# Patient Record
Sex: Male | Born: 1980 | Race: Black or African American | Hispanic: No | Smoking: Current every day smoker
Health system: Southern US, Community
[De-identification: ages and names within clinical notes are randomized; demographics above are authoritative.]

## PROBLEM LIST (undated history)

## (undated) DIAGNOSIS — K5792 Diverticulitis of intestine, part unspecified, without perforation or abscess without bleeding: Secondary | ICD-10-CM

## (undated) DIAGNOSIS — K219 Gastro-esophageal reflux disease without esophagitis: Secondary | ICD-10-CM

## (undated) DIAGNOSIS — I340 Nonrheumatic mitral (valve) insufficiency: Secondary | ICD-10-CM

## (undated) DIAGNOSIS — K3580 Unspecified acute appendicitis: Secondary | ICD-10-CM

## (undated) DIAGNOSIS — I209 Angina pectoris, unspecified: Secondary | ICD-10-CM

## (undated) DIAGNOSIS — F17200 Nicotine dependence, unspecified, uncomplicated: Secondary | ICD-10-CM

## (undated) DIAGNOSIS — Z8489 Family history of other specified conditions: Secondary | ICD-10-CM

## (undated) DIAGNOSIS — N451 Epididymitis: Secondary | ICD-10-CM

## (undated) DIAGNOSIS — K3532 Acute appendicitis with perforation and localized peritonitis, without abscess: Secondary | ICD-10-CM

## (undated) DIAGNOSIS — I2510 Atherosclerotic heart disease of native coronary artery without angina pectoris: Secondary | ICD-10-CM

## (undated) DIAGNOSIS — R079 Chest pain, unspecified: Secondary | ICD-10-CM

## (undated) DIAGNOSIS — E669 Obesity, unspecified: Secondary | ICD-10-CM

## (undated) HISTORY — PX: CYSTECTOMY: SUR359

## (undated) HISTORY — DX: Atherosclerotic heart disease of native coronary artery without angina pectoris: I25.10

## (undated) HISTORY — DX: Chest pain, unspecified: R07.9

---
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
## 2014-03-16 ENCOUNTER — Encounter (HOSPITAL_COMMUNITY): Payer: Self-pay | Admitting: *Deleted

## 2014-03-16 ENCOUNTER — Emergency Department (HOSPITAL_COMMUNITY)
Admission: EM | Admit: 2014-03-16 | Discharge: 2014-03-16 | Disposition: A | Discharge status: Home / Self Care | Payer: Self-pay | Attending: Emergency Medicine | Admitting: Emergency Medicine

## 2014-03-16 DIAGNOSIS — H109 Unspecified conjunctivitis: Secondary | ICD-10-CM | POA: Insufficient documentation

## 2014-03-16 MED ORDER — SULFACETAMIDE SODIUM 10 % OP SOLN: 1.0000 [drp] | OPHTHALMIC | Status: AC

## 2014-03-16 NOTE — ED Provider Notes (Signed)
CSN: [id number]     Arrival date & time 03/16/14  0901 History  This chart was scribed for non-physician practitioner, Ebbie Ridgehris Shashwat Cleary, PA-C, working with Doug SouSam Jacubowitz, MD, by Lionel DecemberHatice Demirci, ED Scribe. This patient was seen in room TR04C/TR04C and the patient's care was started at 9:11 AM.   First MD Initiated Contact with Patient 03/16/14 [phone]     No chief complaint on file.    (Consider location/radiation/quality/duration/timing/severity/associated sxs/prior Treatment) The history is provided by the patient. No language interpreter was used.    HPI Comments: Kyle Silva is a 34 y.o. male who presents to the Emergency Department complaining of constant right eye pain/irritation and itching onset 1 day ago.  Patient does not wear any contacts but states that he has bad vision.  He has not taken any medication to alleviate symptoms.  He does not have any other complaints today.   No past medical history on file. No past surgical history on file. No family history on file. History  Substance Use Topics  . Smoking status: Not on file  . Smokeless tobacco: Not on file  . Alcohol Use: Not on file    Review of Systems  Constitutional: Negative for fever and chills.  Eyes: Positive for pain, redness and itching.  Respiratory: Negative for cough and shortness of breath.   Cardiovascular: Negative for chest pain.  Gastrointestinal: Negative for nausea and vomiting.      Allergies  Review of patient's allergies indicates not on file.  Home Medications   Prior to Admission medications   Not on File   BP 130/76 mmHg  Pulse 61  Temp(Src) 98.5 F (36.9 C) (Oral)  Resp 18  SpO2 97% Physical Exam  Constitutional: He is oriented to person, place, and time. He appears well-developed and well-nourished. No distress.  HENT:  Head: Normocephalic and atraumatic.  Eyes: EOM are normal. Pupils are equal, round, and reactive to light. Right conjunctiva is injected.  Pulmonary/Chest:  Effort normal. No respiratory distress.  Musculoskeletal: Normal range of motion.  Neurological: He is alert and oriented to person, place, and time.  Skin: Skin is warm and dry.  Psychiatric: He has a normal mood and affect. His behavior is normal.  Nursing note and vitals reviewed.   ED Course  Procedures (including critical care time) DIAGNOSTIC STUDIES: Oxygen Saturation is 97% on RA, normal by my interpretation.    COORDINATION OF CARE: 9:16 AM Discussed treatment plan with patient at beside, the patient agrees with the plan and has no further questions at this time.  Referred to opthalmology told to return here as needed.      Charlestine NightChristopher Kadence Mikkelson, PA-C 03/16/14 [phone]  Doug SouSam Jacubowitz, MD 03/16/14 [phone]

## 2014-03-16 NOTE — ED Notes (Signed)
Pt reports waking up this AM with eye irritation to RT eye . Pain 3/10 to RT eye.

## 2014-03-16 NOTE — Discharge Instructions (Signed)
Return here as needed. Follow up with the eye doctor provided.  °

## 2014-03-16 NOTE — ED Notes (Signed)
Declined W/C at D/C and was escorted to lobby by RN. 

## 2014-07-13 ENCOUNTER — Inpatient Hospital Stay (HOSPITAL_COMMUNITY)
Admission: EM | Admit: 2014-07-13 | Discharge: 2014-07-14 | DRG: 287 | Disposition: A | Discharge status: Home / Self Care | Payer: Self-pay | Attending: Cardiovascular Disease | Admitting: Cardiovascular Disease

## 2014-07-13 ENCOUNTER — Encounter (HOSPITAL_COMMUNITY)
Admission: EM | Disposition: A | Discharge status: Home / Self Care | Payer: Self-pay | Source: Home / Self Care | Attending: Cardiovascular Disease

## 2014-07-13 ENCOUNTER — Encounter (HOSPITAL_COMMUNITY): Payer: Self-pay | Admitting: Emergency Medicine

## 2014-07-13 ENCOUNTER — Emergency Department (HOSPITAL_COMMUNITY): Payer: Self-pay

## 2014-07-13 ENCOUNTER — Ambulatory Visit (HOSPITAL_COMMUNITY): Payer: Self-pay

## 2014-07-13 DIAGNOSIS — Z8249 Family history of ischemic heart disease and other diseases of the circulatory system: Secondary | ICD-10-CM

## 2014-07-13 DIAGNOSIS — F17200 Nicotine dependence, unspecified, uncomplicated: Secondary | ICD-10-CM | POA: Diagnosis present

## 2014-07-13 DIAGNOSIS — R079 Chest pain, unspecified: Secondary | ICD-10-CM

## 2014-07-13 DIAGNOSIS — E669 Obesity, unspecified: Secondary | ICD-10-CM | POA: Diagnosis present

## 2014-07-13 DIAGNOSIS — K219 Gastro-esophageal reflux disease without esophagitis: Secondary | ICD-10-CM | POA: Diagnosis present

## 2014-07-13 DIAGNOSIS — E785 Hyperlipidemia, unspecified: Secondary | ICD-10-CM | POA: Diagnosis present

## 2014-07-13 DIAGNOSIS — R001 Bradycardia, unspecified: Secondary | ICD-10-CM | POA: Diagnosis present

## 2014-07-13 DIAGNOSIS — I25110 Atherosclerotic heart disease of native coronary artery with unstable angina pectoris: Principal | ICD-10-CM | POA: Diagnosis present

## 2014-07-13 DIAGNOSIS — I2510 Atherosclerotic heart disease of native coronary artery without angina pectoris: Secondary | ICD-10-CM

## 2014-07-13 DIAGNOSIS — F17210 Nicotine dependence, cigarettes, uncomplicated: Secondary | ICD-10-CM | POA: Diagnosis present

## 2014-07-13 DIAGNOSIS — R739 Hyperglycemia, unspecified: Secondary | ICD-10-CM | POA: Diagnosis present

## 2014-07-13 DIAGNOSIS — I200 Unstable angina: Secondary | ICD-10-CM | POA: Diagnosis present

## 2014-07-13 HISTORY — DX: Family history of other specified conditions: Z84.89

## 2014-07-13 HISTORY — DX: Gastro-esophageal reflux disease without esophagitis: K21.9

## 2014-07-13 HISTORY — DX: Angina pectoris, unspecified: I20.9

## 2014-07-13 HISTORY — PX: CARDIAC CATHETERIZATION: SHX172

## 2014-07-13 HISTORY — DX: Obesity, unspecified: E66.9

## 2014-07-13 HISTORY — DX: Nicotine dependence, unspecified, uncomplicated: F17.200

## 2014-07-13 LAB — BASIC METABOLIC PANEL
Anion gap: 8 (ref 5–15)
BUN: 11 mg/dL (ref 6–20)
CALCIUM: 9.4 mg/dL (ref 8.9–10.3)
CO2: 28 mmol/L (ref 22–32)
CREATININE: 1.03 mg/dL (ref 0.61–1.24)
Chloride: 102 mmol/L (ref 101–111)
GFR calc Af Amer: >60 mL/min (ref >60)
GFR calc non Af Amer: >60 mL/min (ref >60)
GLUCOSE: 157 mg/dL — AB (ref 65–99)
Potassium: 4.2 mmol/L (ref 3.5–5.1)
Sodium: 138 mmol/L (ref 135–145)

## 2014-07-13 LAB — LIPID PANEL
CHOL/HDL RATIO: 5.5 ratio
Cholesterol: 216 mg/dL — ABNORMAL HIGH (ref 0–200)
HDL: 39 mg/dL — AB (ref >40)
LDL CALC: 143 mg/dL — AB (ref 0–99)
Triglycerides: 168 mg/dL — ABNORMAL HIGH (ref <150)
VLDL: 34 mg/dL (ref 0–40)

## 2014-07-13 LAB — I-STAT TROPONIN, ED: Troponin i, poc: 0.3 ng/mL (ref 0.00–0.08)

## 2014-07-13 LAB — CBC
HEMATOCRIT: 44.3 % (ref 39.0–52.0)
Hemoglobin: 15.3 g/dL (ref 13.0–17.0)
MCH: 31 pg (ref 26.0–34.0)
MCHC: 34.5 g/dL (ref 30.0–36.0)
MCV: 89.7 fL (ref 78.0–100.0)
PLATELETS: 253 10*3/uL (ref 150–400)
RBC: 4.94 MIL/uL (ref 4.22–5.81)
RDW: 13.1 % (ref 11.5–15.5)
WBC: 14.1 10*3/uL — AB (ref 4.0–10.5)

## 2014-07-13 IMAGING — CR DG CHEST 1V PORT
1 series · 1 of 1 positions shown · non-contrast
Comparison: None.

CLINICAL DATA: Chest pain.

EXAM:
PORTABLE CHEST - 1 VIEW

[AP]
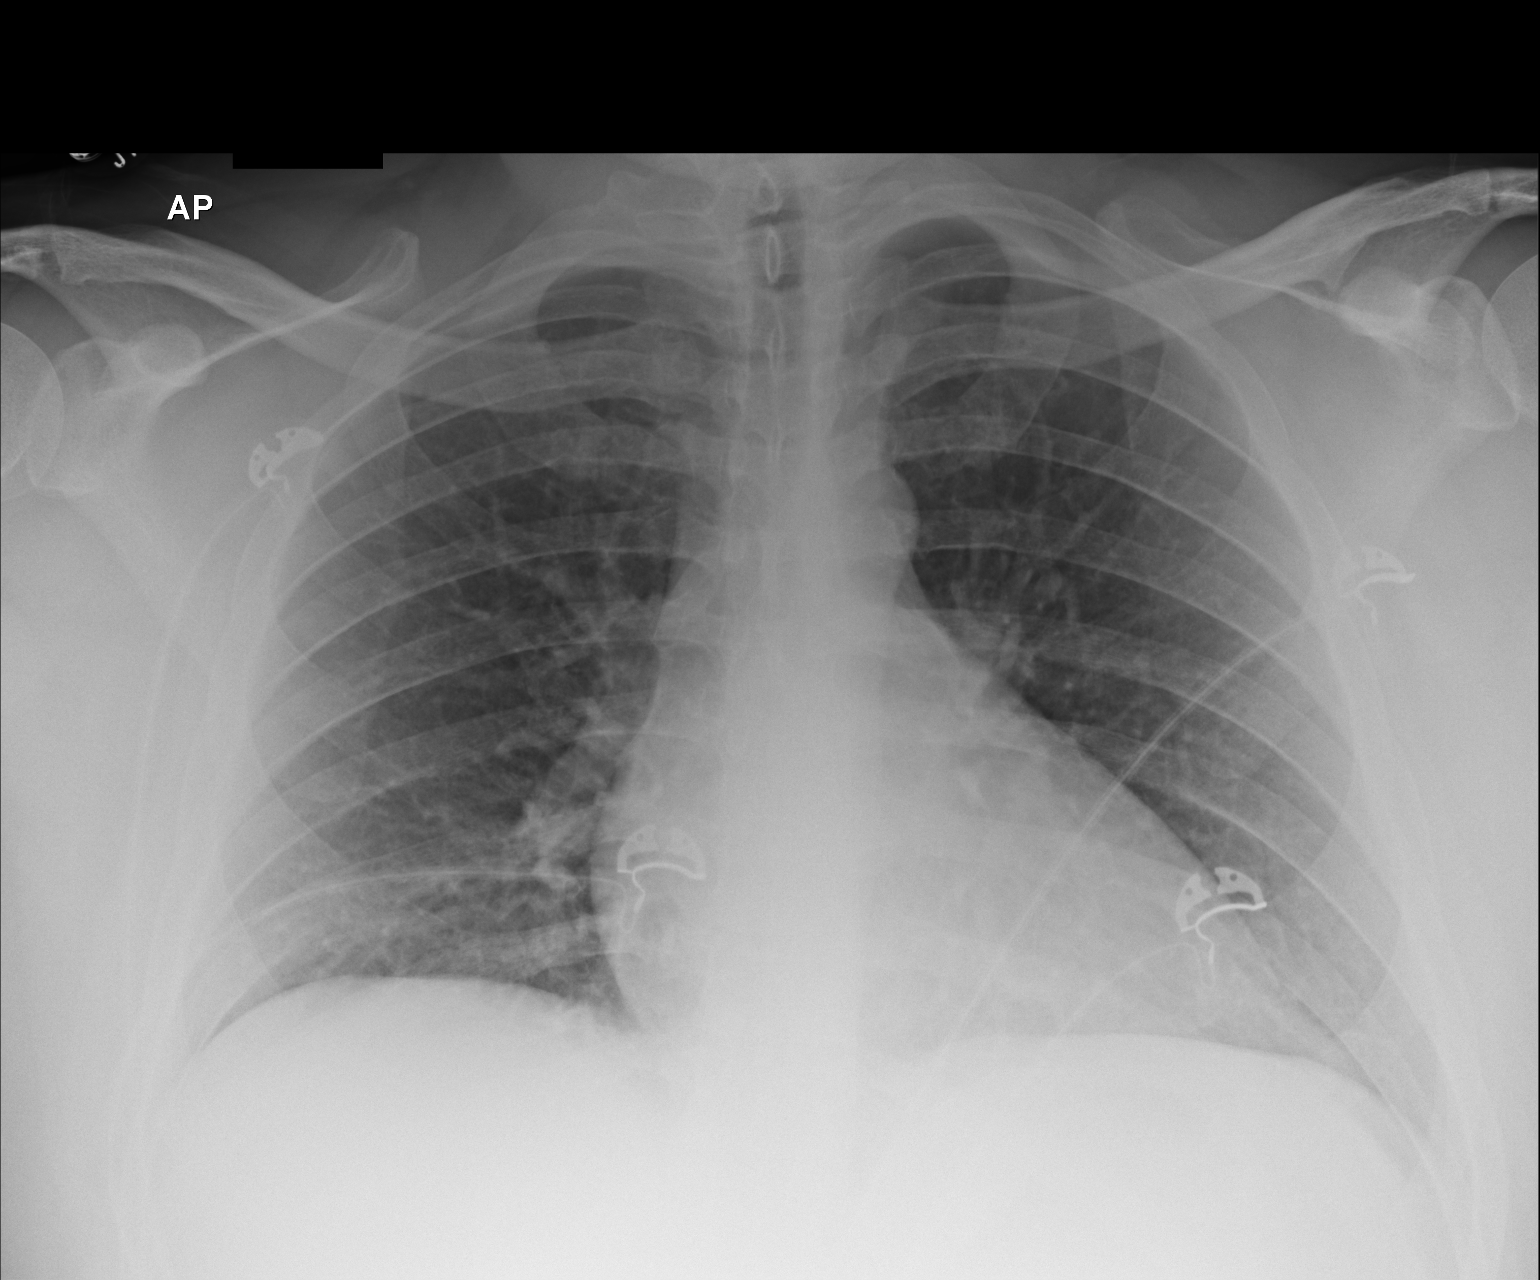

[1 of 1 positions shown; findings below may reference images not displayed]

FINDINGS: Normal heart size and mediastinal contours. Mild hypoventilation. No
acute infiltrate or edema. No effusion or pneumothorax. No acute
osseous findings.
IMPRESSION: Negative low volume chest.

## 2014-07-13 SURGERY — LEFT HEART CATH AND CORONARY ANGIOGRAPHY
Anesthesia: LOCAL

## 2014-07-13 MED ORDER — METOPROLOL TARTRATE 12.5 MG HALF TABLET
12.5000 mg | ORAL_TABLET | Freq: Two times a day (BID) | ORAL | Status: DC
  Administered 2014-07-13: 12.5 mg via ORAL
  Filled 2014-07-13 (×2): qty 1

## 2014-07-13 MED ORDER — MORPHINE SULFATE 4 MG/ML IJ SOLN
4.0000 mg | Freq: Once | INTRAMUSCULAR | Status: AC
  Administered 2014-07-13: 4 mg via INTRAVENOUS
  Filled 2014-07-13: qty 1

## 2014-07-13 MED ORDER — SODIUM CHLORIDE 0.9 % IJ SOLN
3.0000 mL | Freq: Two times a day (BID) | INTRAMUSCULAR | Status: DC
  Administered 2014-07-13: 3 mL via INTRAVENOUS

## 2014-07-13 MED ORDER — RADIAL COCKTAIL (HEPARIN/VERAPAMIL/LIDOCAINE/NITRO)
Status: DC | PRN
  Administered 2014-07-13: 1 via INTRA_ARTERIAL

## 2014-07-13 MED ORDER — SODIUM CHLORIDE 0.9 % WEIGHT BASED INFUSION
3.0000 mL/kg/h | INTRAVENOUS | Status: AC
Start: 2014-07-13 — End: 2014-07-13

## 2014-07-13 MED ORDER — ACETAMINOPHEN 325 MG PO TABS
650.0000 mg | ORAL_TABLET | ORAL | Status: DC | PRN
  Administered 2014-07-13: 650 mg via ORAL
  Filled 2014-07-13: qty 2

## 2014-07-13 MED ORDER — NITROGLYCERIN 0.4 MG SL SUBL: 0.4000 mg | SUBLINGUAL_TABLET | SUBLINGUAL | Status: DC | PRN

## 2014-07-13 MED ORDER — SODIUM CHLORIDE 0.9 % IV SOLN: 250.0000 mL | INTRAVENOUS | Status: DC | PRN

## 2014-07-13 MED ORDER — IOHEXOL 350 MG/ML SOLN
INTRAVENOUS | Status: DC | PRN
  Administered 2014-07-13: 130 mL via INTRA_ARTERIAL

## 2014-07-13 MED ORDER — LIDOCAINE HCL (PF) 1 % IJ SOLN
INTRAMUSCULAR | Status: AC
  Filled 2014-07-13: qty 30

## 2014-07-13 MED ORDER — NITROGLYCERIN 0.4 MG SL SUBL
0.4000 mg | SUBLINGUAL_TABLET | SUBLINGUAL | Status: DC | PRN
  Administered 2014-07-13: 0.4 mg via SUBLINGUAL

## 2014-07-13 MED ORDER — SODIUM CHLORIDE 0.9 % WEIGHT BASED INFUSION: 1.0000 mL/kg/h | INTRAVENOUS | Status: DC

## 2014-07-13 MED ORDER — ATORVASTATIN CALCIUM 40 MG PO TABS
40.0000 mg | ORAL_TABLET | Freq: Every day | ORAL | Status: DC
  Administered 2014-07-13: 40 mg via ORAL
  Filled 2014-07-13: qty 1

## 2014-07-13 MED ORDER — SODIUM CHLORIDE 0.9 % WEIGHT BASED INFUSION: 3.0000 mL/kg/h | INTRAVENOUS | Status: AC

## 2014-07-13 MED ORDER — HEPARIN SODIUM (PORCINE) 1000 UNIT/ML IJ SOLN
INTRAMUSCULAR | Status: AC
  Filled 2014-07-13: qty 1

## 2014-07-13 MED ORDER — NITROGLYCERIN 0.4 MG SL SUBL
SUBLINGUAL_TABLET | SUBLINGUAL | Status: AC
  Filled 2014-07-13: qty 1

## 2014-07-13 MED ORDER — ACETAMINOPHEN 325 MG PO TABS: 650.0000 mg | ORAL_TABLET | ORAL | Status: DC | PRN

## 2014-07-13 MED ORDER — MORPHINE SULFATE 2 MG/ML IJ SOLN: 2.0000 mg | INTRAMUSCULAR | Status: DC | PRN

## 2014-07-13 MED ORDER — FENTANYL CITRATE (PF) 100 MCG/2ML IJ SOLN
INTRAMUSCULAR | Status: DC | PRN
  Administered 2014-07-13 (×2): 25 ug via INTRAVENOUS

## 2014-07-13 MED ORDER — HEPARIN (PORCINE) IN NACL 2-0.9 UNIT/ML-% IJ SOLN
INTRAMUSCULAR | Status: AC
  Filled 2014-07-13: qty 500

## 2014-07-13 MED ORDER — VERAPAMIL HCL 2.5 MG/ML IV SOLN
INTRAVENOUS | Status: AC
  Filled 2014-07-13: qty 2

## 2014-07-13 MED ORDER — FENTANYL CITRATE (PF) 100 MCG/2ML IJ SOLN
INTRAMUSCULAR | Status: AC
  Filled 2014-07-13: qty 2

## 2014-07-13 MED ORDER — ASPIRIN EC 81 MG PO TBEC: 81.0000 mg | DELAYED_RELEASE_TABLET | Freq: Every day | ORAL | Status: DC

## 2014-07-13 MED ORDER — HEPARIN (PORCINE) IN NACL 2-0.9 UNIT/ML-% IJ SOLN
INTRAMUSCULAR | Status: AC
  Filled 2014-07-13: qty 1000

## 2014-07-13 MED ORDER — SODIUM CHLORIDE 0.9 % IJ SOLN: 3.0000 mL | INTRAMUSCULAR | Status: DC | PRN

## 2014-07-13 MED ORDER — MIDAZOLAM HCL 2 MG/2ML IJ SOLN
INTRAMUSCULAR | Status: AC
  Filled 2014-07-13: qty 2

## 2014-07-13 MED ORDER — SODIUM CHLORIDE 0.9 % IJ SOLN
3.0000 mL | Freq: Two times a day (BID) | INTRAMUSCULAR | Status: DC
  Administered 2014-07-13: 10 mL via INTRAVENOUS

## 2014-07-13 MED ORDER — NITROGLYCERIN 1 MG/10 ML FOR IR/CATH LAB
INTRA_ARTERIAL | Status: AC
  Filled 2014-07-13: qty 10

## 2014-07-13 MED ORDER — ONDANSETRON HCL 4 MG/2ML IJ SOLN: 4.0000 mg | Freq: Four times a day (QID) | INTRAMUSCULAR | Status: DC | PRN

## 2014-07-13 MED ORDER — HEPARIN SODIUM (PORCINE) 1000 UNIT/ML IJ SOLN
INTRAMUSCULAR | Status: DC | PRN
  Administered 2014-07-13: 6000 [IU] via INTRAVENOUS

## 2014-07-13 MED ORDER — LIDOCAINE HCL (PF) 1 % IJ SOLN
INTRAMUSCULAR | Status: DC | PRN
  Administered 2014-07-13: 5 mL via INTRADERMAL

## 2014-07-13 MED ORDER — ASPIRIN 325 MG PO TABS
325.0000 mg | ORAL_TABLET | ORAL | Status: AC
  Administered 2014-07-13: 325 mg via ORAL
  Filled 2014-07-13: qty 1

## 2014-07-13 MED ORDER — ASPIRIN 81 MG PO CHEW: 81.0000 mg | CHEWABLE_TABLET | ORAL | Status: DC

## 2014-07-13 MED ORDER — MIDAZOLAM HCL 2 MG/2ML IJ SOLN
INTRAMUSCULAR | Status: DC | PRN
  Administered 2014-07-13: 2 mg via INTRAVENOUS

## 2014-07-13 SURGICAL SUPPLY — 14 items
CATH INFINITI 5 FR AR1 MOD (CATHETERS) ×2 IMPLANT
CATH INFINITI 5 FR JL3.5 (CATHETERS) ×2 IMPLANT
CATH INFINITI 5FR ANG PIGTAIL (CATHETERS) ×2 IMPLANT
CATH INFINITI JR4 5F (CATHETERS) ×2 IMPLANT
CATH LAUNCHER 5F EBU3.5 (CATHETERS) ×2 IMPLANT
CATH OPTITORQUE TIG 4.0 5F (CATHETERS) ×2 IMPLANT
DEVICE RAD COMP TR BAND LRG (VASCULAR PRODUCTS) ×2 IMPLANT
GLIDESHEATH SLEND A-KIT 6F 22G (SHEATH) ×2 IMPLANT
KIT HEART LEFT (KITS) ×2 IMPLANT
PACK CARDIAC CATHETERIZATION (CUSTOM PROCEDURE TRAY) ×2 IMPLANT
SYR MEDRAD MARK V 150ML (SYRINGE) ×2 IMPLANT
TRANSDUCER W/STOPCOCK (MISCELLANEOUS) ×2 IMPLANT
TUBING CIL FLEX 10 FLL-RA (TUBING) ×2 IMPLANT
WIRE SAFE-T 1.5MM-J .035X260CM (WIRE) ×2 IMPLANT

## 2014-07-13 NOTE — ED Notes (Signed)
Pt arrives from home c/o CP new onset last night.  Pt states central chest,  Some N/V, denies lightheadedness, dizziness, SOB.  Pt reports more gas than usual, hx heart burn. Pt belching at this time. Resp e/u.

## 2014-07-13 NOTE — Interval H&P Note (Signed)
History and Physical Interval Note:  07/13/2014 8:59 AM  Bernestine AmassJames Roback  has presented today for surgery, with the diagnosis of urgent ACS/acute coronary syndrome.    The various methods of treatment have been discussed with the patient and family. After consideration of risks, benefits and other options for treatment, the patient has consented to  Procedure(s): Left Heart Cath and Coronary Angiography (N/A) as a surgical intervention .  The patient's history has been reviewed, patient examined, no change in status, stable for surgery.  I have reviewed the patient's chart and labs.  Questions were answered to the patient's satisfaction.     HARDING, DAVID W  Cath Lab Visit (complete for each Cath Lab visit)  Clinical Evaluation Leading to the Procedure:   ACS: Yes.    Non-ACS:    Anginal Classification: CCS IV  Anti-ischemic medical therapy: No Therapy  Non-Invasive Test Results: No non-invasive testing performed  Prior CABG: No previous CABG    TIMI Score  Patient Information:  TIMI Score is 2  UA/NSTEMI and low-risk features (e.g., TIMI score  Revascularization of the presumed culprit artery   U (6)  Indication: 9; Score: 6  HARDING, Piedad ClimesAVID W, M.D., M.S. Interventional Cardiologist   Pager # [phone]

## 2014-07-13 NOTE — ED Notes (Signed)
Pt tearful after provider left, his father passed away a year and a day ago from a massive heart attack.  RN comforted pt and visitor.

## 2014-07-13 NOTE — ED Provider Notes (Signed)
CSN: [id number]     Arrival date & time 07/13/14  0555 History   First MD Initiated Contact with Patient 07/13/14 [phone]     Chief Complaint  Patient presents with  . Chest Pain     (Consider location/radiation/quality/duration/timing/severity/associated sxs/prior Treatment) HPI Comments: Patient presents to the ED with a chief complaint of chest pain.  States that the pain started last night and is located in the center of his chest.  He reports associated nausea, vomiting, and diaphoresis.  Denies any radiating symptoms or SOB.  States that he has not taken anything for his symptoms.  States that he has a history of GERD, but that this pain is above where he normally experiences GERD.  The pain has been constant.  Cardiac risk factors are smoking and family history.  The history is provided by the patient. No language interpreter was used.    History reviewed. No pertinent past medical history. Past Surgical History  Procedure Laterality Date  . Cyst removal hand Left   . Cyst removal leg Left    No family history on file. History  Substance Use Topics  . Smoking status: Current Every Day Smoker -- 0.75 packs/day    Types: Cigarettes  . Smokeless tobacco: Never Used  . Alcohol Use: No    Review of Systems  Constitutional: Negative for fever and chills.  Respiratory: Negative for shortness of breath.   Cardiovascular: Positive for chest pain.  Gastrointestinal: Positive for nausea and vomiting. Negative for diarrhea and constipation.  Genitourinary: Negative for dysuria.  All other systems reviewed and are negative.     Allergies  Penicillins  Home Medications   Prior to Admission medications   Not on File   BP 149/86 mmHg  Pulse 8  Temp(Src) 98.2 F (36.8 C) (Oral)  Resp 20  Ht  (1.778 m)  Wt 250 lb (113.399 kg)  BMI 35.87 kg/m2  SpO2 100% Physical Exam  Constitutional: He is oriented to person, place, and time. He appears well-developed and  well-nourished.  HENT:  Head: Normocephalic and atraumatic.  Eyes: Conjunctivae and EOM are normal. Pupils are equal, round, and reactive to light. Right eye exhibits no discharge. Left eye exhibits no discharge. No scleral icterus.  Neck: Normal range of motion. Neck supple. No JVD present.  Cardiovascular: Normal rate, regular rhythm and normal heart sounds.  Exam reveals no gallop and no friction rub.   No murmur heard. Pulmonary/Chest: Effort normal and breath sounds normal. No respiratory distress. He has no wheezes. He has no rales. He exhibits no tenderness.  Abdominal: Soft. He exhibits no distension and no mass. There is no tenderness. There is no rebound and no guarding.  Musculoskeletal: Normal range of motion. He exhibits no edema or tenderness.  Neurological: He is alert and oriented to person, place, and time.  Skin: Skin is warm and dry.  Psychiatric: He has a normal mood and affect. His behavior is normal. Judgment and thought content normal.  Nursing note and vitals reviewed.   ED Course  Procedures (including critical care time) Results for orders placed or performed during the hospital encounter of 07/13/14  CBC  Result Value Ref Range   WBC 14.1 (H) 4.0 - 10.5 K/uL   RBC 4.94 4.22 - 5.81 MIL/uL   Hemoglobin 15.3 13.0 - 17.0 g/dL   HCT 11.9 14.7 - 82.9 %   MCV 89.7 78.0 - 100.0 fL   MCH 31.0 26.0 - 34.0 pg   MCHC 34.5 30.0 -  36.0 g/dL   RDW 16.113.1 09.611.5 - 04.515.5 %   Platelets 253 150 - 400 K/uL  Basic metabolic panel  Result Value Ref Range   Sodium 138 135 - 145 mmol/L   Potassium 4.2 3.5 - 5.1 mmol/L   Chloride 102 101 - 111 mmol/L   CO2 28 22 - 32 mmol/L   Glucose, Bld 157 (H) 65 - 99 mg/dL   BUN 11 6 - 20 mg/dL   Creatinine, Ser 4.091.03 0.61 - 1.24 mg/dL   Calcium 9.4 8.9 - 81.110.3 mg/dL   GFR calc non Af Amer >60 >60 mL/min   GFR calc Af Amer >60 >60 mL/min   Anion gap 8 5 - 15  I-stat troponin, ED  (not at [facility], [facility])  Result Value Ref Range   Troponin i, poc  0.30 (HH) 0.00 - 0.08 ng/mL   Comment NOTIFIED PHYSICIAN    Comment 3           Dg Chest Port 1 View  07/13/2014   CLINICAL DATA:  Chest pain.  EXAM: PORTABLE CHEST - 1 VIEW  COMPARISON:  None.  FINDINGS: Normal heart size and mediastinal contours. Mild hypoventilation. No acute infiltrate or edema. No effusion or pneumothorax. No acute osseous findings.  IMPRESSION: Negative low volume chest.   Electronically Signed   By: Marnee SpringJonathon  Watts M.D.   On: 07/13/2014 06:52     Imaging Review No results found.   EKG Interpretation   Date/Time:  Tuesday July 13 2014 06:00:34 EDT Ventricular Rate:  65 PR Interval:  163 QRS Duration: 100 QT Interval:  368 QTC Calculation: 383 R Axis:   50 Text Interpretation:  Sinus rhythm Atrial premature complex Nonspecific T  abnormalities, diffuse leads Confirmed by Ranae PalmsYELVERTON  MD, DAVID ([id number]) on  07/13/2014 6:41:16 AM      MDM   Final diagnoses:  Chest pain    Patient with constant chest pain since last night.  Troponin is elevated at 0.3.  EKG shows t-wave inversions with no prior to compare to.  Patient seen by and discussed with Dr. Ranae PalmsYelverton.  He has received morphine and aspirin.  Will consult cardiology.    7:44 AM Patient reassessed.  States that he is now pain free.  Cardiology consult still pending.  7:57 AM Appreciate consultation with Dr. Elease HashimotoNahser from cardiology, who will see the patient in the ED.  Patient taken to cath lab.  Roxy Horsemanobert Laterria Lasota, PA-C 07/13/14 1523  Loren Raceravid Yelverton, MD 07/14/14 [phone]

## 2014-07-13 NOTE — Progress Notes (Signed)
UR Completed [place] Graves-Bigelow, RN,BSN [phone]  

## 2014-07-13 NOTE — H&P (Signed)
ADMISSION HISTORY AND PHYSICAL   Date: 07/13/2014               Patient Name:  Kyle Silva MRN: [id number]  DOB: 16-Aug-1980 Age / Sex: [age] y.o., male        PCP: No PCP Per Patient Primary Cardiologist: New, Kristien Salatino           History of Present Illness: Patient is a 34 y.o. male with a PMHx of CP, dyspnea,  , who was [facility]2014 for evaluation of chest pain and + troponins   Pain started last night.   Mid sternal , constant , pressure, like a gas bubble. Was at a cook out. Had been eating some hot dogs.  [place]. Took generic zantac - no effect.  Associated with nausea , vomitting, diaphoresis. Has a hx of GERD but this pain is different .  Smoker , + family hx o Troponin levels are  +    Works at [employer],  Physical job. Has not had similar pains in the past.    Medications: Outpatient medications:  (Not in a hospital admission)  Allergies  Allergen Reactions  . Penicillins Hives     History reviewed. No pertinent past medical history.  Past Surgical History  Procedure Laterality Date  . Cyst removal hand Left   . Cyst removal leg Left     No family history on file.  Social History:  reports that he has been smoking Cigarettes.  He has been smoking about 0.75 packs per day. He has never used smokeless tobacco. He reports that he does not drink alcohol or use illicit drugs.   Review of Systems: Constitutional:  denies fever, chills, diaphoresis, appetite change and fatigue.  HEENT: denies photophobia, eye pain, redness, hearing loss, ear pain, congestion, sore throat, rhinorrhea, sneezing, neck pain, neck stiffness and tinnitus.  Respiratory: denies SOB, DOE, cough, chest tightness, and wheezing.  Cardiovascular: admits to chest pain - denies  palpitations and leg swelling.  Gastrointestinal: admits to nausea,    Genitourinary: denies dysuria, urgency, frequency, hematuria, flank pain and difficulty urinating.  Musculoskeletal: denies   myalgias, back pain, joint swelling, arthralgias and gait problem.   Skin: denies pallor, rash and wound.  Neurological: denies dizziness, seizures, syncope, weakness, light-headedness, numbness and headaches.   Hematological: denies adenopathy, easy bruising, personal or family bleeding history.  Psychiatric/ Behavioral: denies suicidal ideation, mood changes, confusion, nervousness, sleep disturbance and agitation.    Physical Exam: BP 119/70 mmHg  Pulse 77  Temp(Src) 98.2 F (36.8 C) (Oral)  Resp 20  Ht 5\' 10"  (1.778 m)  Wt 113.399 kg (250 lb)  BMI 35.87 kg/m2  SpO2 100%  Wt Readings from Last 3 Encounters:  07/13/14 113.399 kg (250 lb)    General: Vital signs reviewed and noted. Well-developed, well-nourished, in no acute distress; alert,   Head: Normocephalic, atraumatic, sclera anicteric, mucus membranes are moist   Neck: Supple. Negative for carotid bruits. JVD not elevated.   Lungs:  Clear bilaterally to auscultation without wheezes, rales, or rhonchi. Breathing is normal   Heart: RRR with S1 S2. No murmurs, rubs, or gallops.   Abdomen:  Soft, non-tender, non-distended with normoactive bowel sounds. No hepatomegaly. No rebound/guarding. No obvious abdominal masses   MSK: Strength and the appear normal for age.   Extremities: No clubbing or cyanosis. No edema.  Distal pedal pulses are 2+ and equal bilaterally .  Neurologic: Alert and oriented X 3. Moves  all extremities spontaneously   Psych:  normal     Lab results: Basic Metabolic Panel:  Recent Labs Lab 07/13/14 0614  NA 138  K 4.2  CL 102  CO2 28  GLUCOSE 157*  BUN 11  CREATININE 1.03  CALCIUM 9.4    Liver Function Tests: No results for input(s): AST, ALT, ALKPHOS, BILITOT, PROT, ALBUMIN in the last 168 hours. No results for input(s): LIPASE, AMYLASE in the last 168 hours.  CBC:  Recent Labs Lab 07/13/14 0614  WBC 14.1*  HGB 15.3  HCT 44.3  MCV 89.7  PLT 253    Cardiac Enzymes: No results  for input(s): CKTOTAL, CKMB, CKMBINDEX, TROPONINI in the last 168 hours.  BNP: Invalid input(s): POCBNP  CBG: No results for input(s): GLUCAP in the last 168 hours.  Coagulation Studies: No results for input(s): LABPROT, INR in the last 72 hours.   Other results:  EKG  - reviewed by me :  NSR , very minimal ST elevation in III with TWI in II, III, F, V5, V6.    Imaging: Dg Chest Port 1 View  07/13/2014   CLINICAL DATA:  Chest pain.  EXAM: PORTABLE CHEST - 1 VIEW  COMPARISON:  None.  FINDINGS: Normal heart size and mediastinal contours. Mild hypoventilation. No acute infiltrate or edema. No effusion or pneumothorax. No acute osseous findings.  IMPRESSION: Negative low volume chest.   Electronically Signed   By: Marnee Spring M.D.   On: 07/13/2014 06:52       Assessment & Plan:  1. Chest pain :  Very concerning for ACS. Has had CP since last night.  Ongoing, constant.  Pain has eased some but still has some pressure .   Associated with subtle ECG changes and + troponin . I think that he needs a cath Discussed risks, benefits, options, He understands and agrees to proceed.  Advised him to stop smoking    2. Hyperglycemia - no formal dx of DM. Will check HbA1C. Does not go to the doctor much    DVT PPX -    Vesta Mixer, Montez Hageman., MD, [facility] 07/13/2014, 8:09 AM

## 2014-07-13 NOTE — Care Management Note (Addendum)
Case Management Note  Patient Details  Name: Bernestine AmassJames Signer MRN: [id number] Date of Birth: 1980/01/22  Subjective/Objective:  Pt admitted for unstable angina.                  Action/Plan: No needs identified by CM at this time.    Expected Discharge Date:                  Expected Discharge Plan:  Home/Self Care  In-House Referral:     Discharge planning Services  CM Consult  Post Acute Care Choice:    Choice offered to:     DME Arranged:    DME Agency:     HH Arranged:    HH Agency:     Status of Service:  Completed, signed off  Medicare Important Message Given:    Date Medicare IM Given:    Medicare IM give by:    Date Additional Medicare IM Given:    Additional Medicare Important Message give by:     If discussed at Long Length of Stay Meetings, dates discussed:    Additional Comments:  1044 07-14-14 Tomi BambergerBrenda Graves-Bigelow, RN, BSN [phone]  Pt is without insurance. CM did provide pt with [facility] Flyer with phone number for pt to call and set up PCP needs. No further needs from CM at this time.   Gala LewandowskyGraves-Bigelow, Shabre Kreher Kaye, RN 07/13/2014, 2:11 PM

## 2014-07-13 NOTE — Progress Notes (Signed)
  Echocardiogram 2D Echocardiogram has been performed.  Arvil ChacoFoster, Mikya Don 07/13/2014, 11:56 AM

## 2014-07-14 ENCOUNTER — Other Ambulatory Visit: Payer: Self-pay | Admitting: Physician Assistant

## 2014-07-14 DIAGNOSIS — R072 Precordial pain: Secondary | ICD-10-CM

## 2014-07-14 DIAGNOSIS — E785 Hyperlipidemia, unspecified: Secondary | ICD-10-CM

## 2014-07-14 LAB — HEMOGLOBIN A1C
Hgb A1c MFr Bld: 6.3 % — ABNORMAL HIGH (ref 4.8–5.6)
MEAN PLASMA GLUCOSE: 134 mg/dL

## 2014-07-14 MED ORDER — ASPIRIN 81 MG PO TBEC
81.0000 mg | DELAYED_RELEASE_TABLET | Freq: Every day | ORAL | Status: DC
Start: 2014-07-15 — End: 2015-07-18

## 2014-07-14 MED ORDER — PANTOPRAZOLE SODIUM 40 MG PO TBEC: 40.0000 mg | DELAYED_RELEASE_TABLET | Freq: Every day | ORAL | Status: DC

## 2014-07-14 MED ORDER — ASPIRIN 81 MG PO CHEW: 81.0000 mg | CHEWABLE_TABLET | ORAL | Status: DC

## 2014-07-14 MED ORDER — ATORVASTATIN CALCIUM 40 MG PO TABS: 40.0000 mg | ORAL_TABLET | Freq: Every day | ORAL | Status: DC

## 2014-07-14 NOTE — Discharge Summary (Signed)
Discharge Summary   Patient ID: Kyle Silva,  MRN: [id number], DOB/AGE: 1980-08-13 34 y.o.  Admit date: 07/13/2014 Discharge date: 07/14/2014  Primary Care Provider: No PCP Per Patient Primary Cardiologist: Dr. Elease HashimotoNahser  Discharge Diagnoses Principal Problem:   Chest pain Active Problems:   Current smoker   Family history of premature CAD   Obese   Unstable angina   Allergies Allergies  Allergen Reactions  . Penicillins Hives    Procedures  Echocardiogram 07/13/2014 LV EF: 60% -  65%  ------------------------------------------------------------------- Indications:   Chest pain 786.51.  ------------------------------------------------------------------- History:  PMH: Acute coronary syndrome. Risk factors: Family history of coronary artery disease. Current tobacco use.  ------------------------------------------------------------------- Study Conclusions  - Left ventricle: The cavity size was normal. Wall thickness was normal. Systolic function was normal. The estimated ejection fraction was in the range of 60% to 65%. Wall motion was normal; there were no regional wall motion abnormalities. Left ventricular diastolic function parameters were normal.    Cardiac catheterization 07/13/2014 Conclusion    1. 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 2. There is hyperdynamic left ventricular systolic function. 3. 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 Hyperdynamic LV with mid cavity obliteration but normal LVEDP. The LV wall thickness does appear to be increased.  Likely not CAD related troponin elevation.  Recommendation:  Standard post radial cath care with TR band removal  2-D Echocardiogram to evaluate for possible Hypertrophic Cardiomyopathy  Risk factor modification.  Anticipate discharge either later on today or tomorrow depending on  stability and chest pain relief       Hospital Course  This is a 34 year old African-American male without significant past cardiac history who [facility]13[facility]2014 with chest pain. He was found to have an elevated point-of-care troponin in the ED at 0.3. Cardiology was consulted. Patient has positive smoking history and family history of CAD. Given the positive troponin, it  was concerned the patient likely have underlying CAD.  he underwent cardiac catheterization on the same day which showed 40% mid to distal LAD stenosis, otherwise no significant CAD. He has hyperdynamic left ventricular systolic function > 65. Echocardiogram was obtained which showed EF 60-65%, no regional wall motion abnormality.   He was seen on the following day on 07/14/2014, at which time he denies any significant chest discomfort or shortness of breath. His right radial cath site appears to be stable without significant bleeding or hematoma, with good distal radial pulse. A lipid panel was obtained which showed cholesterol 216, triglyceride 168, LDL 143, HDL 39. He has been started on 40 mg Lipitor. On telemetry, he has some bradycardia, beta blocker was stopped. Otherwise he is deemed stable for discharge from cardiology perspective. I have instructed him not to lift anything > 5 lbs for one week. We will start a trial PPI on discharge. He will continue on aspirin, and statin. He has been encouraged to care with primary care physician. I have arranged outpatient cardiology follow-up in 6 weeks. I have also arranged for him to obtain liver function test and lipid panel in 4 weeks after starting statin.    Discharge Vitals Blood pressure 117/74, pulse 46, temperature 98.8 F (37.1 C), temperature source Oral, resp. rate 17, height 5\' 10"  (1.778 m), weight 265 lb 3.2 oz (120.294 kg), SpO2 100 %.  Filed Weights   07/13/14 0602 07/14/14 0600  Weight:  250 lb (113.399 kg) 265 lb 3.2 oz (120.294 kg)     Labs  CBC  Recent Labs  07/13/14 0614  WBC 14.1*  HGB 15.3  HCT 44.3  MCV 89.7  PLT 253   Basic Metabolic Panel  Recent Labs  07/13/14 0614  NA 138  K 4.2  CL 102  CO2 28  GLUCOSE 157*  BUN 11  CREATININE 1.03  CALCIUM 9.4   Hemoglobin A1C  Recent Labs  07/13/14 1205  HGBA1C 6.3*   Fasting Lipid Panel  Recent Labs  07/13/14 1205  CHOL 216*  HDL 39*  LDLCALC 143*  TRIG 168*  CHOLHDL 5.5    Disposition  Pt is being discharged home today in good condition.  Follow-up Plans & Appointments      Follow-up Information    Follow up with Tereso Newcomer, PA-C On 08/25/2014.   Specialties:  Physician Assistant, Radiology, Interventional Cardiology   Why:  11:50am   Contact information:   [address] [place] [address] [phone]       Follow up with [facility] On 08/16/2014.   Specialty:  Cardiology   Why:  Check Lipid Panel and Liver Function test, no food or drink in morning of the blood test. Sips of water with meds ok.   Contact information:   [address], [address] [place] [address] [phone]      Please follow up.   Why:  Please establish with a primary care physician who can manage hyperlipidemia      Discharge Medications    Medication List    STOP taking these medications        ranitidine 75 MG tablet  Commonly known as:  ZANTAC      TAKE these medications        aspirin 81 MG EC tablet  Take 1 tablet (81 mg total) by mouth daily.  Start taking on:  07/15/2014     atorvastatin 40 MG tablet  Commonly known as:  LIPITOR  Take 1 tablet (40 mg total) by mouth daily at 6 PM.  Notes to Patient:  TAKE ONE DOSE TONIGHT AT 6 PM     pantoprazole 40 MG tablet  Commonly known as:  PROTONIX  Take 1 tablet (40 mg total) by mouth daily.  Notes to Patient:  TAKE ONE DOSE TODAY        Outstanding Labs/Studies  Check Lipid Panel and LFT in 4 weeks  Duration  of Discharge Encounter   Greater than 30 minutes including physician time.  Ramond Dial PA-C Pager: [phone] 07/14/2014, 11:08 AM

## 2014-07-14 NOTE — Progress Notes (Signed)
Patient Name: Kyle Silva Date of Encounter: 07/14/2014  Primary Cardiologist: Dr. Elease Hashimoto   Principal Problem:   Chest pain Active Problems:   Current smoker   Family history of premature CAD   Obese   Unstable angina    SUBJECTIVE  Chest pain resolved last night and has not recurred since. Felt similar to GERD with lots of burping. Denies ever having SOB.   CURRENT MEDS . aspirin  81 mg Oral Pre-Cath  . [START ON 07/15/2014] aspirin EC  81 mg Oral Daily  . atorvastatin  40 mg Oral q1800  . metoprolol tartrate  12.5 mg Oral BID  . sodium chloride  3 mL Intravenous Q12H  . sodium chloride  3 mL Intravenous Q12H    OBJECTIVE  Filed Vitals:   07/13/14 2023 07/13/14 2146 07/14/14 0530 07/14/14 0600  BP: 121/73  117/74   Pulse: 53 62 46   Temp: 98.3 F (36.8 C)  98.8 F (37.1 C)   TempSrc: Oral  Oral   Resp: 24  17   Height:      Weight:    265 lb 3.2 oz (120.294 kg)  SpO2: 99%  100%     Intake/Output Summary (Last 24 hours) at 07/14/14 0826 Last data filed at 07/14/14 0600  Gross per 24 hour  Intake    260 ml  Output   1000 ml  Net   -740 ml   Filed Weights   07/13/14 0602 07/14/14 0600  Weight: 250 lb (113.399 kg) 265 lb 3.2 oz (120.294 kg)    PHYSICAL EXAM  General: Pleasant, NAD. Neuro: Alert and oriented X 3. Moves all extremities spontaneously. Psych: Normal affect. HEENT:  Normal  Neck: Supple without bruits or JVD. Lungs:  Resp regular and unlabored, CTA.  Heart: RRR no s3, s4, or murmurs. R radial cath site stable.  Abdomen: Soft, non-tender, non-distended, BS + x 4.  Extremities: No clubbing, cyanosis or edema. DP/PT/Radials 2+ and equal bilaterally.  Accessory Clinical Findings  CBC  Recent Labs  07/13/14 0614  WBC 14.1*  HGB 15.3  HCT 44.3  MCV 89.7  PLT 253   Basic Metabolic Panel  Recent Labs  07/13/14 0614  NA 138  K 4.2  CL 102  CO2 28  GLUCOSE 157*  BUN 11  CREATININE 1.03  CALCIUM 9.4   Fasting Lipid  Panel  Recent Labs  07/13/14 1205  CHOL 216*  HDL 39*  LDLCALC 143*  TRIG 168*  CHOLHDL 5.5    TELE NSR with HR 50s    ECG  No new EKG  Echocardiogram 07/13/2014  LV EF: 60% -  65%  ------------------------------------------------------------------- Indications:   Chest pain 786.51.  ------------------------------------------------------------------- History:  PMH: Acute coronary syndrome. Risk factors: Family history of coronary artery disease. Current tobacco use.  ------------------------------------------------------------------- Study Conclusions  - Left ventricle: The cavity size was normal. Wall thickness was normal. Systolic function was normal. The estimated ejection fraction was in the range of 60% to 65%. Wall motion was normal; there were no regional wall motion abnormalities. Left ventricular diastolic function parameters were normal.    Radiology/Studies  Dg Chest Port 1 View  07/13/2014   CLINICAL DATA:  Chest pain.  EXAM: PORTABLE CHEST - 1 VIEW  COMPARISON:  None.  FINDINGS: Normal heart size and mediastinal contours. Mild hypoventilation. No acute infiltrate or edema. No effusion or pneumothorax. No acute osseous findings.  IMPRESSION: Negative low volume chest.   Electronically Signed   By: Marja Kays  Watts M.D.   On: 07/13/2014 06:52    ASSESSMENT AND PLAN  1. Chest pain with elevated trop 0.3  -  Cath 07/13/2014 EF >65, 40% tubular mid LAD to distal LAD stenosis, hyperdynamic LV, radial cath approach difficult  - Echo 07/13/2014 EF 60-65%, no RWMA  - pending urine drug test.   - Do not have a good explanation for elevated trop, pt denies ever having blood clot, HR 40-50s. Denies any SOB, suspicion for PE low.   - chest pain felt like burning sensation with lots of burping yesterday, ?GERD (which should not increase trop), will do a trial of PPI.   - Metoprolol and ASA added during this admission. Will discuss with MD, likely  discharge today. Will need outpt cardiology followup given nonobstructive CAD noted on cath  2. Hyperglycemia: pending hgb A1C  3. Hyperlipidemia: chol 216, trig 168, LDL 143, HDL 39. Started on 40mg  lipitor    Signed, Azalee CourseMeng, Hao PA-C Pager: [phone] As above, patient seen and examined. No further chest pain. Cardiac catheterization revealed non obstructive coronary disease. Echocardiogram showed normal LV function. No risk factors for PE. Plan discharge today on aspirin and Lipitor for hyperlipidemia. DC metoprolol. Check lipids and liver in 4 weeks. Patient will need follow-up of mild elevation in white blood cell count, glucose following discharge. I have asked him to establish with a primary care physician. I instructed him on the importance of discontinuing tobacco use. Patient will follow-up with Dr. Elease HashimotoNahser 6-8 weeks. > 30 min PA and physician time D2 Olga MillersBrian Aneesa Romey

## 2014-07-14 NOTE — Discharge Instructions (Signed)
Chest Pain (Nonspecific) °It is often hard to give a specific diagnosis for the cause of chest pain. There is always a chance that your pain could be related to something serious, such as a heart attack or a blood clot in the lungs. You need to follow up with your health care provider for further evaluation. °CAUSES  °· Heartburn. °· Pneumonia or bronchitis. °· Anxiety or stress. °· Inflammation around your heart (pericarditis) or lung (pleuritis or pleurisy). °· A blood clot in the lung. °· A collapsed lung (pneumothorax). It can develop suddenly on its own (spontaneous pneumothorax) or from trauma to the chest. °· Shingles infection (herpes zoster virus). °The chest wall is composed of bones, muscles, and cartilage. Any of these can be the source of the pain. °· The bones can be bruised by injury. °· The muscles or cartilage can be strained by coughing or overwork. °· The cartilage can be affected by inflammation and become sore (costochondritis). °DIAGNOSIS  °Lab tests or other studies may be needed to find the cause of your pain. Your health care provider may have you take a test called an ambulatory electrocardiogram (ECG). An ECG records your heartbeat patterns over a 24-hour period. You may also have other tests, such as: °· Transthoracic echocardiogram (TTE). During echocardiography, sound waves are used to evaluate how blood flows through your heart. °· Transesophageal echocardiogram (TEE). °· Cardiac monitoring. This allows your health care provider to monitor your heart rate and rhythm in real time. °· Holter monitor. This is a portable device that records your heartbeat and can help diagnose heart arrhythmias. It allows your health care provider to track your heart activity for several days, if needed. °· Stress tests by exercise or by giving medicine that makes the heart beat faster. °TREATMENT  °· Treatment depends on what may be causing your chest pain. Treatment may include: °· Acid blockers for  heartburn. °· Anti-inflammatory medicine. °· Pain medicine for inflammatory conditions. °· Antibiotics if an infection is present. °· You may be advised to change lifestyle habits. This includes stopping smoking and avoiding alcohol, caffeine, and chocolate. °· You may be advised to keep your head raised (elevated) when sleeping. This reduces the chance of acid going backward from your stomach into your esophagus. °Most of the time, nonspecific chest pain will improve within 2-3 days with rest and mild pain medicine.  °HOME CARE INSTRUCTIONS  °· If antibiotics were prescribed, take them as directed. Finish them even if you start to feel better. °· For the next few days, avoid physical activities that bring on chest pain. Continue physical activities as directed. °· Do not use any tobacco products, including cigarettes, chewing tobacco, or electronic cigarettes. °· Avoid drinking alcohol. °· Only take medicine as directed by your health care provider. °· Follow your health care provider's suggestions for further testing if your chest pain does not go away. °· Keep any follow-up appointments you made. If you do not go to an appointment, you could develop lasting (chronic) problems with pain. If there is any problem keeping an appointment, call to reschedule. °SEEK MEDICAL CARE IF:  °· Your chest pain does not go away, even after treatment. °· You have a rash with blisters on your chest. °· You have a fever. °SEEK IMMEDIATE MEDICAL CARE IF:  °· You have increased chest pain or pain that spreads to your arm, neck, jaw, back, or abdomen. °· You have shortness of breath. °· You have an increasing cough, or you cough   up blood.  You have severe back or abdominal pain.  You feel nauseous or vomit.  You have severe weakness.  You faint.  You have chills. This is an emergency. Do not wait to see if the pain will go away. Get medical help at once. Call your local emergency services (911 in [place] Do not drive  yourself to the hospital. MAKE SURE YOU:   Understand these instructions.  Will watch your condition.  Will get help right away if you are not doing well or get worse. Document Released: 10/04/2004 Document Revised: 12/30/2012 Document Reviewed: 07/31/2007 [facility] Patient Information 2015 [place], [place]. This information is not intended to replace advice given to you by your health care provider. Make sure you discuss any questions you have with your health care provider.   Radial Site Care Refer to this sheet in the next few weeks. These instructions provide you with information on caring for yourself after your procedure. Your caregiver may also give you more specific instructions. Your treatment has been planned according to current medical practices, but problems sometimes occur. Call your caregiver if you have any problems or questions after your procedure. HOME CARE INSTRUCTIONS  You may shower the day after the procedure.Remove the bandage (dressing) and gently wash the site with plain soap and water.Gently pat the site dry.  Do not apply powder or lotion to the site.  Do not submerge the affected site in water for 3 to 5 days.  Inspect the site at least twice daily.  Do not flex or bend the affected arm for 24 hours.  No lifting over 5 pounds (2.3 kg) for 5 days after your procedure.  Do not drive home if you are discharged the same day of the procedure. Have someone else drive you.  You may drive 24 hours after the procedure unless otherwise instructed by your caregiver.  Do not operate machinery or power tools for 24 hours.  A responsible adult should be with you for the first 24 hours after you arrive home. What to expect:  Any bruising will usually fade within 1 to 2 weeks.  Blood that collects in the tissue (hematoma) may be painful to the touch. It should usually decrease in size and tenderness within 1 to 2 weeks. SEEK IMMEDIATE MEDICAL CARE IF:  You have  unusual pain at the radial site.  You have redness, warmth, swelling, or pain at the radial site.  You have drainage (other than a small amount of blood on the dressing).  You have chills.  You have a fever or persistent symptoms for more than 72 hours.  You have a fever and your symptoms suddenly get worse.  Your arm becomes pale, cool, tingly, or numb.  You have heavy bleeding from the site. Hold pressure on the site. Document Released: 01/27/2010 Document Revised: 03/19/2011 Document Reviewed: 01/27/2010 [facility] Patient Information 2015 [place], [place]. This information is not intended to replace advice given to you by your health care provider. Make sure you discuss any questions you have with your health care provider.   Cardiac Diet This diet can help prevent heart disease and stroke. Many factors influence your heart health, including eating and exercise habits. Coronary risk rises a lot with abnormal blood fat (lipid) levels. Cardiac meal planning includes limiting unhealthy fats, increasing healthy fats, and making other small dietary changes. General guidelines are as follows:  Adjust calorie intake to reach and maintain desirable body weight.  Limit total fat intake to less than  30% of total calories. Saturated fat should be less than 7% of calories.  Saturated fats are found in animal products and in some vegetable products. Saturated vegetable fats are found in coconut oil, cocoa butter, palm oil, and palm kernel oil. Read labels carefully to avoid these products as much as possible. Use butter in moderation. Choose tub margarines and oils that have 2 grams of fat or less. Good cooking oils are canola and olive oils.  Practice low-fat cooking techniques. Do not fry food. Instead, broil, bake, boil, steam, grill, roast on a rack, stir-fry, or microwave it. Other fat reducing suggestions include:  Remove the skin from poultry.  Remove all visible fat from meats.  Skim  the fat off stews, soups, and gravies before serving them.  Steam vegetables in water or broth instead of sauting them in fat.  Avoid foods with trans fat (or hydrogenated oils), such as commercially fried foods and commercially baked goods. Commercial shortening and deep-frying fats will contain trans fat.  Increase intake of fruits, vegetables, whole grains, and legumes to replace foods high in fat.  Increase consumption of nuts, legumes, and seeds to at least 4 servings weekly. One serving of a legume equals  cup, and 1 serving of nuts or seeds equals  cup.  Choose whole grains more often. Have 3 servings per day (a serving is 1 ounce [oz]).  Eat 4 to 5 servings of vegetables per day. A serving of vegetables is 1 cup of raw leafy vegetables;  cup of raw or cooked cut-up vegetables;  cup of vegetable juice.  Eat 4 to 5 servings of fruit per day. A serving of fruit is 1 medium whole fruit;  cup of dried fruit;  cup of fresh, frozen, or canned fruit;  cup of 100% fruit juice.  Increase your intake of dietary fiber to 20 to 30 grams per day. Insoluble fiber may help lower your risk of heart disease and may help curb your appetite.  Soluble fiber binds cholesterol to be removed from the blood. Foods high in soluble fiber are dried beans, citrus fruits, oats, apples, bananas, broccoli, Brussels sprouts, and eggplant.  Try to include foods fortified with plant sterols or stanols, such as yogurt, breads, juices, or margarines. Choose several fortified foods to achieve a daily intake of 2 to 3 grams of plant sterols or stanols.  Foods with omega-3 fats can help reduce your risk of heart disease. Aim to have a 3.5 oz portion of fatty fish twice per week, such as salmon, mackerel, albacore tuna, sardines, lake trout, or herring. If you wish to take a fish oil supplement, choose one that contains 1 gram of both DHA and EPA.  Limit processed meats to 2 servings (3 oz portion) weekly.  Limit  the sodium in your diet to 1500 milligrams (mg) per day. If you have high blood pressure, talk to a registered dietitian about a DASH (Dietary Approaches to Stop Hypertension) eating plan.  Limit sweets and beverages with added sugar, such as soda, to no more than 5 servings per week. One serving is:   1 tablespoon sugar.  1 tablespoon jelly or jam.   cup sorbet.  1 cup lemonade.   cup regular soda. CHOOSING FOODS Starches  Allowed: Breads: All kinds (wheat, rye, raisin, white, oatmeal, [place], [place], and English muffin bread). Low-fat rolls: English muffins, frankfurter and hamburger buns, bagels, pita bread, tortillas (not fried). Pancakes, waffles, biscuits, and muffins made with recommended oil.  Avoid: Products made  with saturated or trans fats, oils, or whole milk products. Butter rolls, cheese breads, croissants. Commercial doughnuts, muffins, sweet rolls, biscuits, waffles, pancakes, store-bought mixes. Crackers  Allowed: Low-fat crackers and snacks: Animal, graham, rye, saltine (with recommended oil, no lard), oyster, and matzo crackers. Bread sticks, melba toast, rusks, flatbread, pretzels, and light popcorn.  Avoid: High-fat crackers: cheese crackers, butter crackers, and those made with coconut, palm oil, or trans fat (hydrogenated oils). Buttered popcorn. Cereals  Allowed: Hot or cold whole-grain cereals.  Avoid: Cereals containing coconut, hydrogenated vegetable fat, or animal fat. Potatoes / Pasta / Rice  Allowed: All kinds of potatoes, rice, and pasta (such as macaroni, spaghetti, and noodles).  Avoid: Pasta or rice prepared with cream sauce or high-fat cheese. Chow mein noodles, [place] fries. Vegetables  Allowed: All vegetables and vegetable juices.  Avoid: Fried vegetables. Vegetables in cream, butter, or high-fat cheese sauces. Limit coconut. Fruit in cream or custard. Protein  Allowed: Limit your intake of meat, seafood, and poultry to no more than 6  oz (cooked weight) per day. All lean, well-trimmed beef, veal, pork, and lamb. All chicken and [place] without skin. All fish and shellfish. Wild game: wild duck, rabbit, pheasant, and venison. Egg whites or low-cholesterol egg substitutes may be used as desired. Meatless dishes: recipes with dried beans, peas, lentils, and tofu (soybean curd). Seeds and nuts: all seeds and most nuts.  Avoid: Prime grade and other heavily marbled and fatty meats, such as short ribs, spare ribs, rib eye roast or steak, frankfurters, sausage, bacon, and high-fat luncheon meats, mutton. Caviar. Commercially fried fish. Domestic duck, goose, venison sausage. Organ meats: liver, gizzard, heart, chitterlings, brains, kidney, sweetbreads. Dairy  Allowed: Low-fat cheeses: nonfat or low-fat cottage cheese (1% or 2% fat), cheeses made with part skim milk, such as mozzarella, farmers, string, or ricotta. (Cheeses should be labeled no more than 2 to 6 grams fat per oz.). Skim (or 1%) milk: liquid, powdered, or evaporated. Buttermilk made with low-fat milk. Drinks made with skim or low-fat milk or cocoa. Chocolate milk or cocoa made with skim or low-fat (1%) milk. Nonfat or low-fat yogurt.  Avoid: Whole milk cheeses, including colby, cheddar, [place], [address], [place], [place], [place], [address], [place], and blue. Creamed cottage cheese, cream cheese. Whole milk and whole milk products, including buttermilk or yogurt made from whole milk, drinks made from whole milk. Condensed milk, evaporated whole milk, and 2% milk. Soups and Combination Foods  Allowed: Low-fat low-sodium soups: broth, dehydrated soups, homemade broth, soups with the fat removed, homemade cream soups made with skim or low-fat milk. Low-fat spaghetti, lasagna, chili, and Spanish rice if low-fat ingredients and low-fat cooking techniques are used.  Avoid: Cream soups made with whole milk, cream, or high-fat cheese. All other soups. Desserts and  Sweets  Allowed: Sherbet, fruit ices, gelatins, meringues, and angel food cake. Homemade desserts with recommended fats, oils, and milk products. Jam, jelly, honey, marmalade, sugars, and syrups. Pure sugar candy, such as gum drops, hard candy, jelly beans, marshmallows, mints, and small amounts of dark chocolate.  Avoid: Commercially prepared cakes, pies, cookies, frosting, pudding, or mixes for these products. Desserts containing whole milk products, chocolate, coconut, lard, palm oil, or palm kernel oil. Ice cream or ice cream drinks. Candy that contains chocolate, coconut, butter, hydrogenated fat, or unknown ingredients. Buttered syrups. Fats and Oils  Allowed: Vegetable oils: safflower, sunflower, corn, soybean, cottonseed, sesame, canola, olive, or peanut. Non-hydrogenated margarines. Salad dressing or mayonnaise: homemade or commercial, made with a  recommended oil. Low or nonfat salad dressing or mayonnaise.  Limit added fats and oils to 6 to 8 tsp per day (includes fats used in cooking, baking, salads, and spreads on bread). Remember to count the "hidden fats" in foods.  Avoid: Solid fats and shortenings: butter, lard, salt pork, bacon drippings. Gravy containing meat fat, shortening, or suet. Cocoa butter, coconut. Coconut oil, palm oil, palm kernel oil, or hydrogenated oils: these ingredients are often used in bakery products, nondairy creamers, whipped toppings, candy, and commercially fried foods. Read labels carefully. Salad dressings made of unknown oils, sour cream, or cheese, such as blue cheese and Roquefort. Cream, all kinds: half-and-half, light, heavy, or whipping. Sour cream or cream cheese (even if "light" or low-fat). Nondairy cream substitutes: coffee creamers and sour cream substitutes made with palm, palm kernel, hydrogenated oils, or coconut oil. Beverages  Allowed: Coffee (regular or decaffeinated), tea. Diet carbonated beverages, mineral water. Alcohol: Check with your  caregiver. Moderation is recommended.  Avoid: Whole milk, regular sodas, and juice drinks with added sugar. Condiments  Allowed: All seasonings and condiments. Cocoa powder. "Cream" sauces made with recommended ingredients.  Avoid: Carob powder made with hydrogenated fats. SAMPLE MENU Breakfast   cup orange juice   cup oatmeal  1 slice toast  1 tsp margarine  1 cup skim milk Lunch  [place] sandwich with 2 oz [place], 2 slices bread  Lettuce and tomato slices  Fresh fruit  Carrot sticks  Coffee or tea Snack  Fresh fruit or low-fat crackers Dinner  3 oz lean ground beef  1 baked potato  1 tsp margarine   cup asparagus  Lettuce salad  1 tbs non-creamy dressing   cup peach slices  1 cup skim milk Document Released: 10/04/2007 Document Revised: 06/26/2011 Document Reviewed: 02/24/2013 Exit[employer] Patient Information 2015 [place], [place]. This information is not intended to replace advice given to you by your health care provider. Make sure you discuss any questions you have with your health care provider.

## 2014-07-15 MED FILL — Nitroglycerin IV Soln 100 MCG/ML in D5W: INTRA_ARTERIAL | Qty: 10 | Status: AC

## 2014-07-15 MED FILL — Heparin Sodium (Porcine) 2 Unit/ML in Sodium Chloride 0.9%: INTRAMUSCULAR | Qty: 1500 | Status: AC

## 2014-07-20 LAB — URINE DRUGS OF ABUSE SCREEN W ALC, ROUTINE (REF LAB)
Amphetamines, Urine: NEGATIVE ng/mL
Barbiturate, Ur: NEGATIVE ng/mL
COCAINE (METAB.): NEGATIVE ng/mL
Ethanol U, Quan: NEGATIVE %
Methadone Screen, Urine: NEGATIVE ng/mL
PHENCYCLIDINE, UR: NEGATIVE ng/mL
Propoxyphene, Urine: NEGATIVE ng/mL

## 2014-07-20 LAB — OPIATES CONFIRMATION, URINE
CODEINE: NEGATIVE
MORPHINE: POSITIVE — AB
Morphine GC/MS Conf: 482 ng/mL
OPIATES: POSITIVE — AB

## 2014-07-20 LAB — PANEL 799049
CARBOXY THC GC/MS CONF: 34 ng/mL
Cannabinoid GC/MS, Ur: POSITIVE — AB

## 2014-07-22 LAB — DRUG PROFILE 799031: BENZODIAZEPINES: NEGATIVE

## 2014-08-13 ENCOUNTER — Telehealth: Payer: Self-pay | Admitting: *Deleted

## 2014-08-13 NOTE — Telephone Encounter (Signed)
UNABLE TO REACH PT TO GET FAMILY HX AND STATUS. I CALLED BOTH THE HOME AND MOBILE NUMBERS.

## 2014-08-16 ENCOUNTER — Other Ambulatory Visit: Payer: Self-pay

## 2014-08-24 NOTE — Progress Notes (Signed)
Cardiology Office Note   Date:  08/24/2014   ID:  Kyle Silva, DOB 06-17-1980, MRN [id number]  PCP:  No PCP Per Patient  Cardiologist:  Dr. Delane Ginger   Electrophysiologist:  n/a  Chief Complaint  Patient presents with  . Hospitalization Follow-up    admx for chest pain     History of Present Illness: Kyle Silva is a 34 y.o. male with a hx of    Studies/Reports Reviewed Today:  Echo 07/13/14 - Left ventricle: The cavity size was normal. Wall thickness was normal. Systolic function was normal. The estimated ejection fraction was in the range of 60% to 65%. Wall motion was normal; there were no regional wall motion abnormalities. Left ventricular diastolic function parameters were normal.  LHC 07/13/14 LM: Ok LAD:  Mid 40% EF 65% 1. 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 2. There is hyperdynamic left ventricular systolic function. 3. 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 Hyperdynamic LV with mid cavity obliteration but normal LVEDP. The LV wall thickness does appear to be increased.  Likely not CAD related troponin elevation.  Recommendation:  Standard post radial cath care with TR band removal  2-D Echocardiogram to evaluate for possible Hypertrophic Cardiomyopathy  Risk factor modification.  Anticipate discharge either later on today or tomorrow depending on stability and chest pain relief    Past Medical History  Diagnosis Date  . Family history of adverse reaction to anesthesia     "dad had allergic reaction to anesthesia"   . Anginal pain 07/13/2014    "mild"  . GERD (gastroesophageal reflux disease)     Past Surgical History  Procedure Laterality Date  . Cystectomy Left ~ 2014    "wrist"  . Cystectomy Left ~ 2014    "2 on my foot""  . Cardiac catheterization N/A 07/13/2014    Procedure: Left Heart Cath and Coronary Angiography;   Surgeon: Marykay Lex, MD;  Location: [facility] INVASIVE CV LAB;  Service: Cardiovascular;  Laterality: N/A;     Current Outpatient Prescriptions  Medication Sig Dispense Refill  . aspirin EC 81 MG EC tablet Take 1 tablet (81 mg total) by mouth daily.    Marland Kitchen atorvastatin (LIPITOR) 40 MG tablet Take 1 tablet (40 mg total) by mouth daily at 6 PM. 30 tablet 11  . pantoprazole (PROTONIX) 40 MG tablet Take 1 tablet (40 mg total) by mouth daily. 30 tablet 5   No current facility-administered medications for this visit.    Allergies:   Penicillins    Social History:  The patient  reports that he has been smoking Cigarettes.  He has a 15 pack-year smoking history. He has never used smokeless tobacco. He reports that he does not drink alcohol or use illicit drugs.   Family History:  The patient's family history is not on file.    ROS:   Please see the history of present illness.   ROS    PHYSICAL EXAM: VS:  There were no vitals taken for this visit.    Wt Readings from Last 3 Encounters:  07/14/14 265 lb 3.2 oz (120.294 kg)     GEN: Well nourished, well developed, in no acute distress HEENT: normal Neck: no JVD, no carotid bruits, no masses Cardiac:  Normal S1/S2, RRR; no murmur ,  no rubs or gallops, no edema  Respiratory:  clear to auscultation bilaterally, no wheezing,  rhonchi or rales. GI: soft, nontender, nondistended, + BS MS: no deformity or atrophy Skin: warm and dry  Neuro:  CNs II-XII intact, Strength and sensation are intact Psych: Normal affect   EKG:  EKG is ordered today.  It demonstrates:      Recent Labs: 07/13/2014: BUN 11; Creatinine, Ser 1.03; Hemoglobin 15.3; Platelets 253; Potassium 4.2; Sodium 138    Lipid Panel    Component Value Date/Time   CHOL 216* 07/13/2014 1205   TRIG 168* 07/13/2014 1205   HDL 39* 07/13/2014 1205   CHOLHDL 5.5 07/13/2014 1205   VLDL 34 07/13/2014 1205   LDLCALC 143* 07/13/2014 1205      ASSESSMENT AND PLAN:  No diagnosis  found.     Medication Changes: Current medicines are reviewed at length with the patient today.  Concerns regarding medicines are as outlined above.  The following changes have been made:   Discontinued Medications   No medications on file   Modified Medications   No medications on file   New Prescriptions   No medications on file     Labs/ tests ordered today include:   No orders of the defined types were placed in this encounter.     Disposition:   FU with    Signed, Tereso Newcomer, PA-C, MHS 08/24/2014 7:34 PM    [facility] [address] [place], [place], [place]  [address] Phone: [phone]; Fax: [phone]    This encounter was created in error - please disregard.

## 2014-08-25 ENCOUNTER — Encounter: Payer: Self-pay | Admitting: Physician Assistant

## 2014-09-01 ENCOUNTER — Encounter: Payer: Self-pay | Admitting: Physician Assistant

## 2014-10-05 ENCOUNTER — Encounter (HOSPITAL_COMMUNITY): Payer: Self-pay | Admitting: Emergency Medicine

## 2014-10-05 ENCOUNTER — Emergency Department (HOSPITAL_COMMUNITY): Payer: Self-pay

## 2014-10-05 ENCOUNTER — Emergency Department (HOSPITAL_COMMUNITY)
Admission: EM | Admit: 2014-10-05 | Discharge: 2014-10-05 | Disposition: A | Discharge status: Home / Self Care | Payer: Self-pay | Attending: Emergency Medicine | Admitting: Emergency Medicine

## 2014-10-05 DIAGNOSIS — Z720 Tobacco use: Secondary | ICD-10-CM | POA: Insufficient documentation

## 2014-10-05 DIAGNOSIS — Z79899 Other long term (current) drug therapy: Secondary | ICD-10-CM | POA: Insufficient documentation

## 2014-10-05 DIAGNOSIS — K2100 Gastro-esophageal reflux disease with esophagitis, without bleeding: Secondary | ICD-10-CM

## 2014-10-05 DIAGNOSIS — K219 Gastro-esophageal reflux disease without esophagitis: Secondary | ICD-10-CM | POA: Insufficient documentation

## 2014-10-05 DIAGNOSIS — R079 Chest pain, unspecified: Secondary | ICD-10-CM

## 2014-10-05 DIAGNOSIS — Z7982 Long term (current) use of aspirin: Secondary | ICD-10-CM | POA: Insufficient documentation

## 2014-10-05 DIAGNOSIS — R0602 Shortness of breath: Secondary | ICD-10-CM | POA: Insufficient documentation

## 2014-10-05 DIAGNOSIS — I209 Angina pectoris, unspecified: Secondary | ICD-10-CM | POA: Insufficient documentation

## 2014-10-05 DIAGNOSIS — Z880 Allergy status to penicillin: Secondary | ICD-10-CM | POA: Insufficient documentation

## 2014-10-05 HISTORY — DX: Diverticulitis of intestine, part unspecified, without perforation or abscess without bleeding: K57.92

## 2014-10-05 LAB — CBC
HCT: 42.5 % (ref 39.0–52.0)
Hemoglobin: 14.5 g/dL (ref 13.0–17.0)
MCH: 30.7 pg (ref 26.0–34.0)
MCHC: 34.1 g/dL (ref 30.0–36.0)
MCV: 89.9 fL (ref 78.0–100.0)
Platelets: 254 10*3/uL (ref 150–400)
RBC: 4.73 MIL/uL (ref 4.22–5.81)
RDW: 13 % (ref 11.5–15.5)
WBC: 5.2 10*3/uL (ref 4.0–10.5)

## 2014-10-05 LAB — BASIC METABOLIC PANEL
Anion gap: 5 (ref 5–15)
BUN: 9 mg/dL (ref 6–20)
CALCIUM: 9 mg/dL (ref 8.9–10.3)
CO2: 26 mmol/L (ref 22–32)
CREATININE: 0.87 mg/dL (ref 0.61–1.24)
Chloride: 107 mmol/L (ref 101–111)
GFR calc Af Amer: >60 mL/min (ref >60)
GFR calc non Af Amer: >60 mL/min (ref >60)
Glucose, Bld: 134 mg/dL — ABNORMAL HIGH (ref 65–99)
Potassium: 3.8 mmol/L (ref 3.5–5.1)
SODIUM: 138 mmol/L (ref 135–145)

## 2014-10-05 LAB — I-STAT TROPONIN, ED
Troponin i, poc: 0 ng/mL (ref 0.00–0.08)
Troponin i, poc: 0 ng/mL (ref 0.00–0.08)

## 2014-10-05 IMAGING — DX DG CHEST 2V
2 series · 2 of 2 positions shown · non-contrast
Comparison: [DATE] chest radiograph.

CLINICAL DATA: Shortness of breath and chest tightness since last
night. Nausea. Smoker.

EXAM:
CHEST  2 VIEW

[w chest pa]
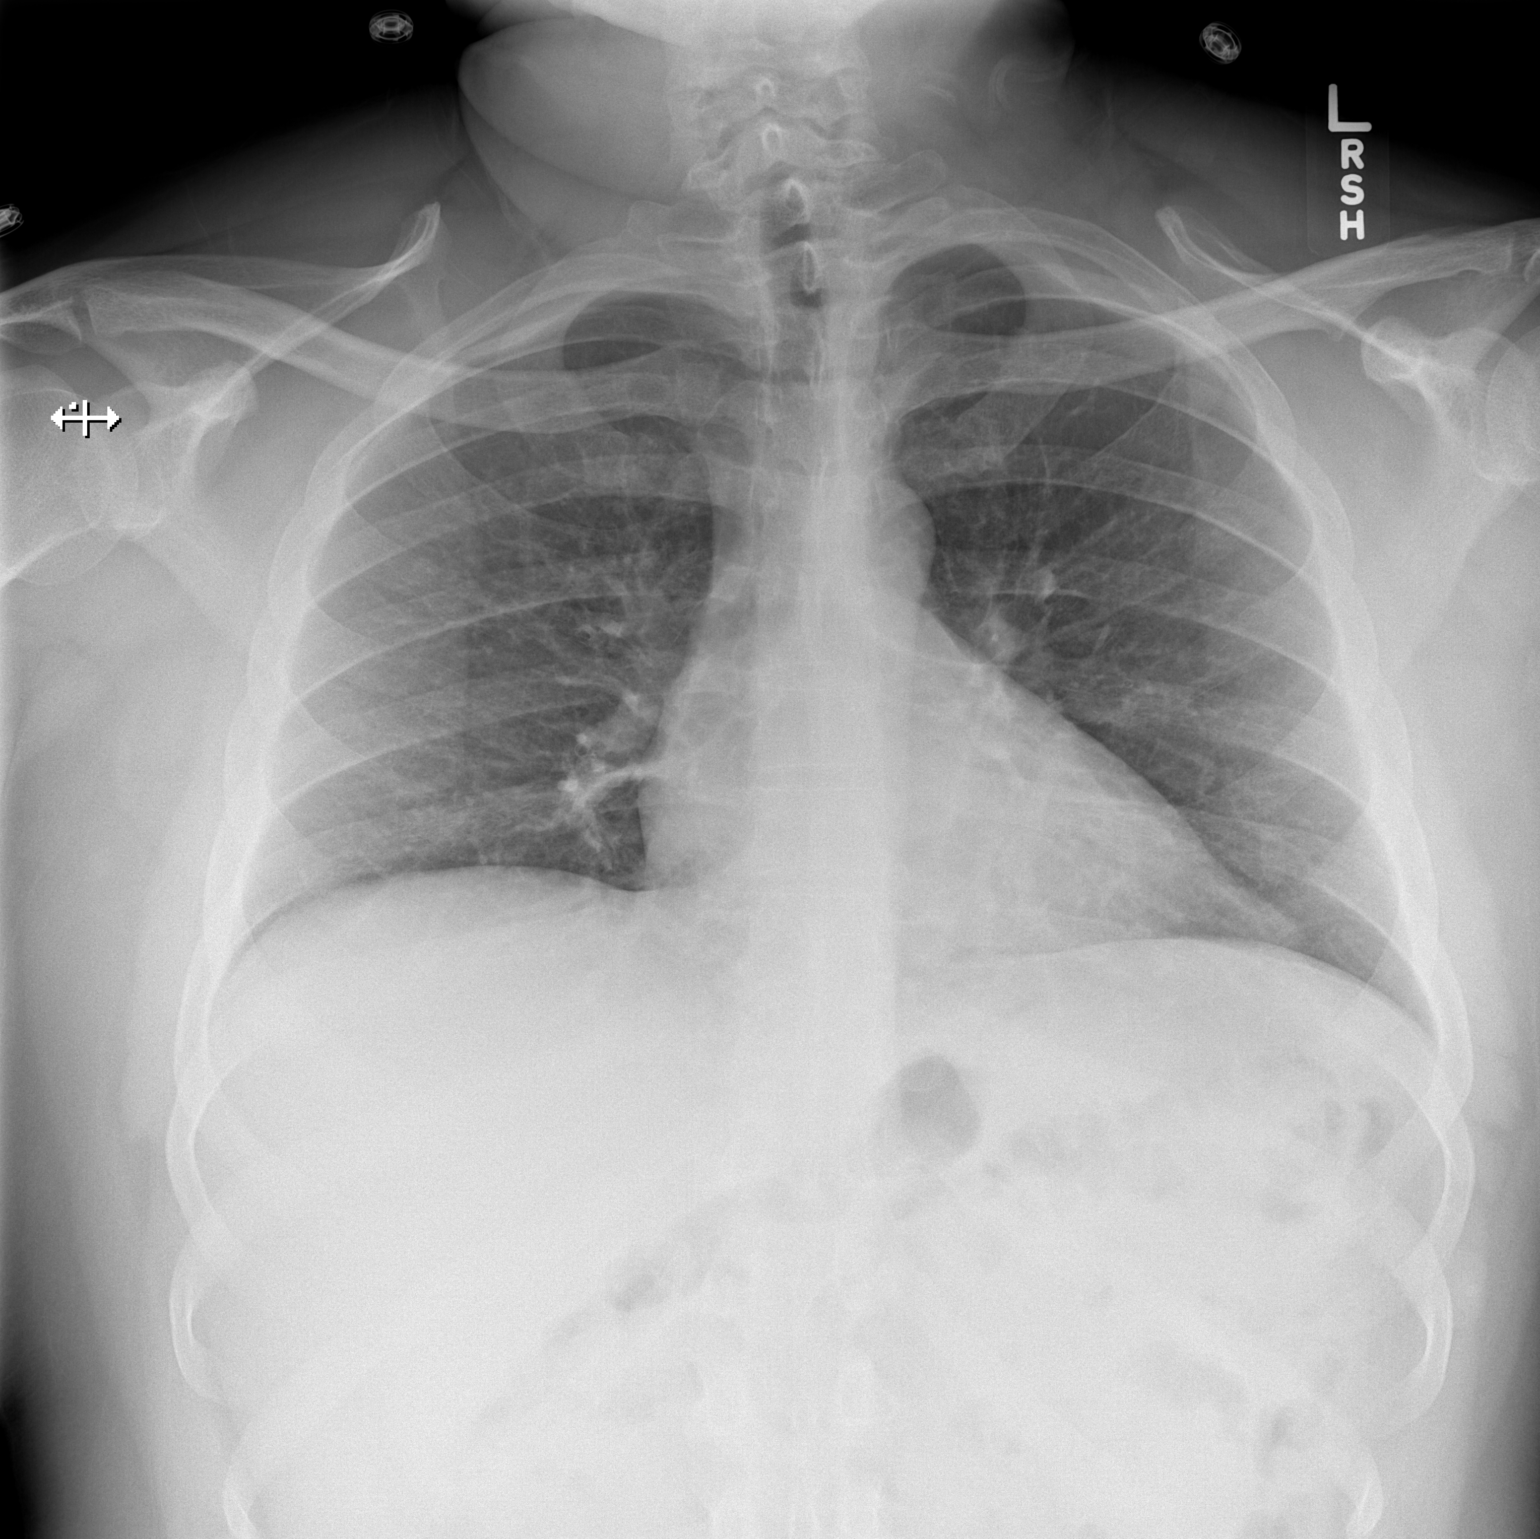

[w chest lat]
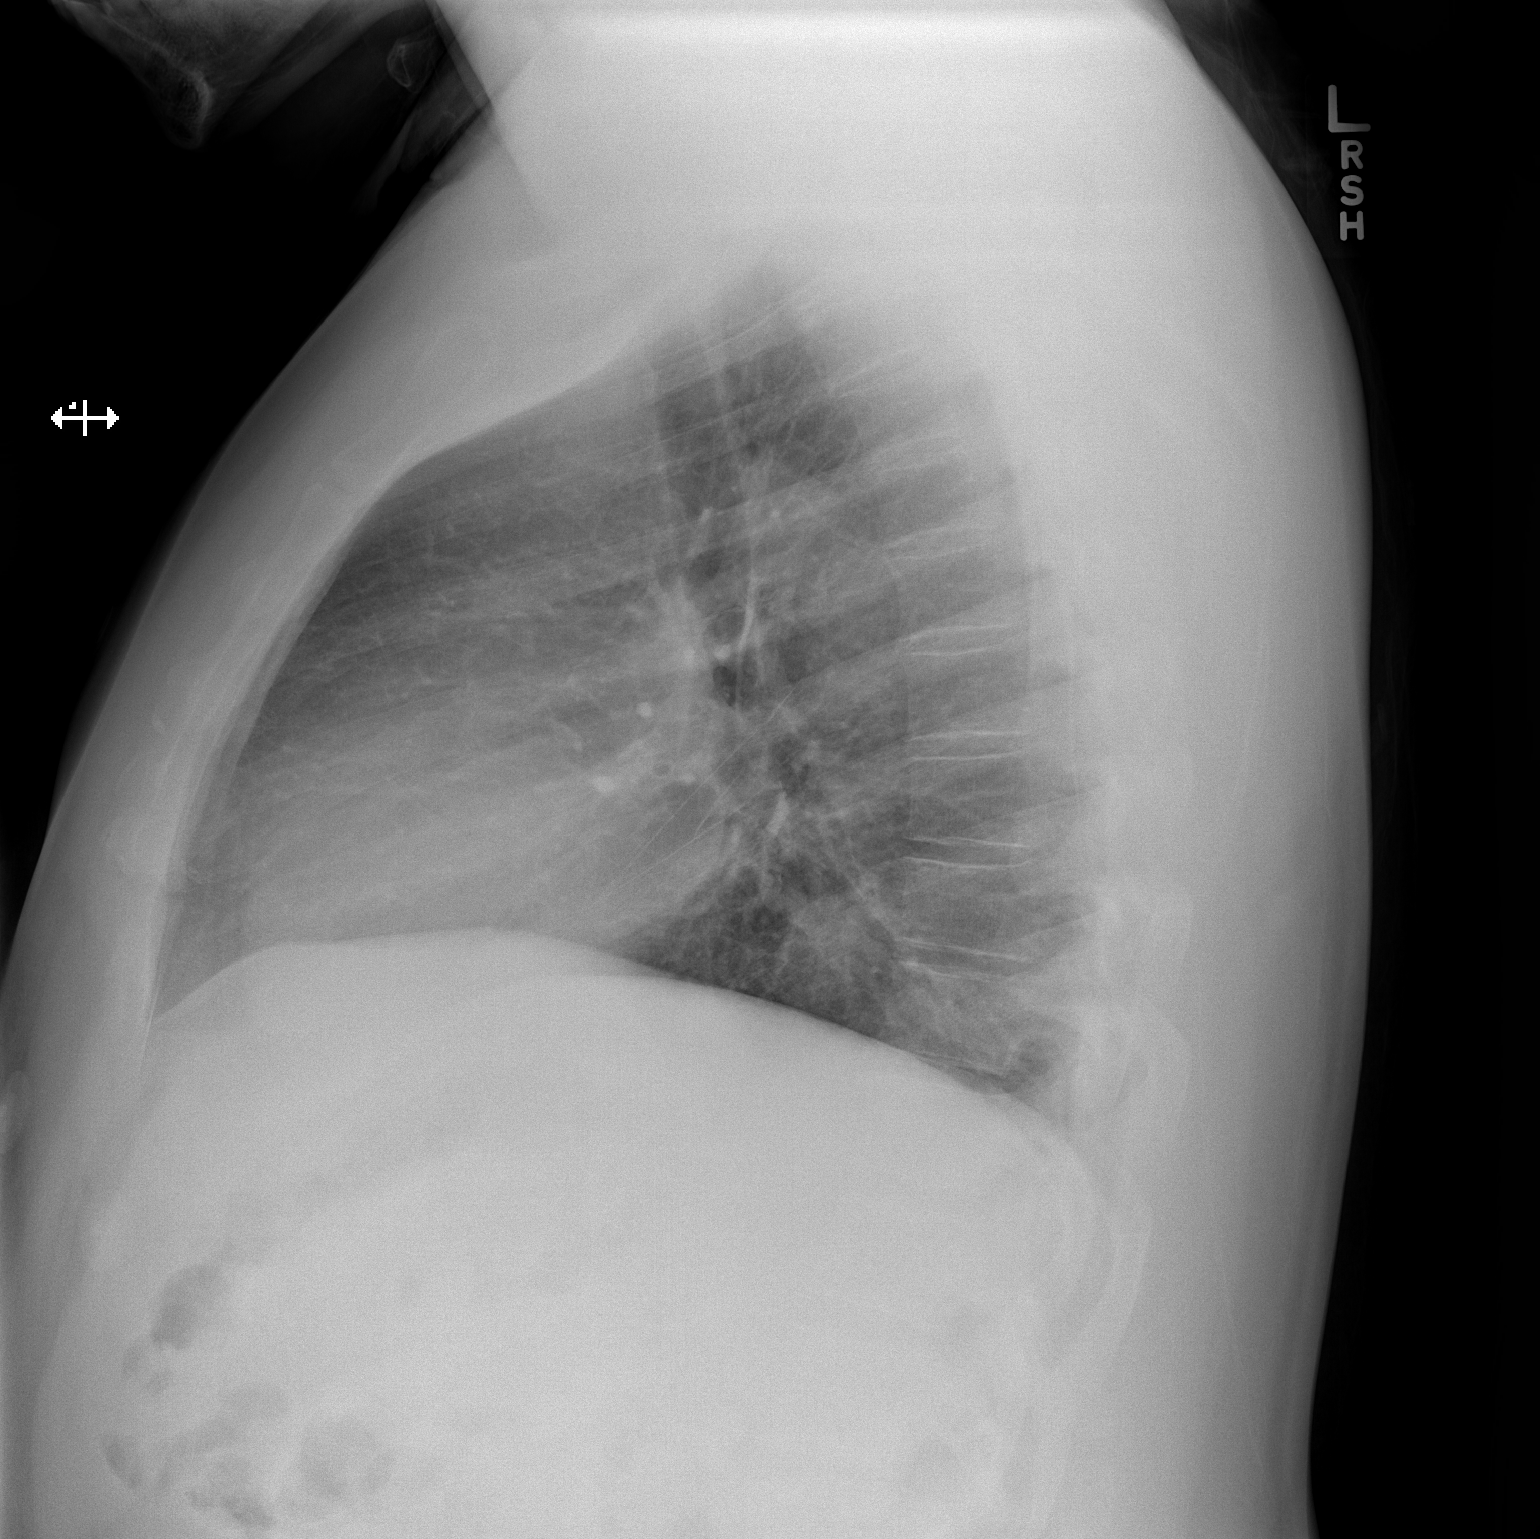

[2 of 2 positions shown; findings below may reference images not displayed]

FINDINGS: Slightly low lung volumes on the PA view. Stable cardiomediastinal
silhouette with normal heart size. No pneumothorax. No pleural
effusion. Clear lungs, with no pulmonary edema. No focal lung
consolidation.
IMPRESSION: No active disease in the chest.

## 2014-10-05 MED ORDER — GI COCKTAIL ~~LOC~~: 30.0000 mL | Freq: Once | ORAL | Status: DC

## 2014-10-05 MED ORDER — FAMOTIDINE 20 MG PO TABS: 20.0000 mg | ORAL_TABLET | Freq: Two times a day (BID) | ORAL | Status: DC

## 2014-10-05 MED ORDER — PANTOPRAZOLE SODIUM 40 MG PO TBEC
40.0000 mg | DELAYED_RELEASE_TABLET | Freq: Every day | ORAL | Status: DC
  Administered 2014-10-05: 40 mg via ORAL
  Filled 2014-10-05: qty 1

## 2014-10-05 MED ORDER — GI COCKTAIL ~~LOC~~
30.0000 mL | Freq: Once | ORAL | Status: DC
  Filled 2014-10-05: qty 30

## 2014-10-05 MED ORDER — GI COCKTAIL ~~LOC~~
30.0000 mL | Freq: Once | ORAL | Status: AC
  Administered 2014-10-05: 30 mL via ORAL

## 2014-10-05 NOTE — Discharge Instructions (Signed)
Return here as needed.  Follow-up with your primary doctor and cardiologist

## 2014-10-05 NOTE — ED Notes (Signed)
Pt sts SOB, chest tightness and dizziness x 2 days

## 2014-10-05 NOTE — Progress Notes (Signed)
Buddy Duty  [facility]ist [facility] for [facility] [phone]  Spoke to patient regarding primary care resources and the [facility] orange card. Orange card application provided and explained. Resource guide and my contact information also given for any future questions or concerns. No other [facility] Specialist needs identified at this time.

## 2014-10-05 NOTE — ED Provider Notes (Signed)
CSN: [id number]     Arrival date & time 10/05/14  1015 History   First MD Initiated Contact with Patient 10/05/14 1032     Chief Complaint  Patient presents with  . Shortness of Breath  . Chest Pain     (Consider location/radiation/quality/duration/timing/severity/associated sxs/prior Treatment) HPI Patient presents to the emergency department with chest discomfort that started yesterday.  The patient states that constant chest discomfort since yesterday.  The patient states earlier this morning he ate a large meal and the pain got worse and he felt a burning sensation felt like it was tight as well.  He states that he has become very anxious.  Patient states that he did have a cardiac catheterization done in July that showed a 40% occlusion of the vessel in his heart, but no other issues.  He should states he did not stent him at that time.  Patient denies  nausea, vomiting, weakness, dizziness, headache, blurred vision, back pain, neck pain, fever, cough, runny nose, sore throat, lightheadedness, near syncope or syncope.  The patient states that nothing seems make his condition, better or worse Past Medical History  Diagnosis Date  . Family history of adverse reaction to anesthesia     "dad had allergic reaction to anesthesia"   . Anginal pain 07/13/2014    "mild"  . GERD (gastroesophageal reflux disease)   . Diverticulitis    Past Surgical History  Procedure Laterality Date  . Cystectomy Left ~ 2014    "wrist"  . Cystectomy Left ~ 2014    "2 on my foot""  . Cardiac catheterization N/A 07/13/2014    Procedure: Left Heart Cath and Coronary Angiography;  Surgeon: Marykay Lex, MD;  Location: [facility] INVASIVE CV LAB;  Service: Cardiovascular;  Laterality: N/A;   History reviewed. No pertinent family history. Social History  Substance Use Topics  . Smoking status: Current Every Day Smoker -- 0.75 packs/day for 20 years    Types: Cigarettes  . Smokeless tobacco: Never Used  . Alcohol Use:  No    Review of Systems All other systems negative except as documented in the HPI. All pertinent positives and negatives as reviewed in the HPI.   Allergies  Penicillins  Home Medications   Prior to Admission medications   Medication Sig Start Date End Date Taking? Authorizing Provider  aspirin EC 81 MG EC tablet Take 1 tablet (81 mg total) by mouth daily. 07/15/14   Azalee Course, PA  atorvastatin (LIPITOR) 40 MG tablet Take 1 tablet (40 mg total) by mouth daily at 6 PM. 07/14/14   Azalee Course, PA  pantoprazole (PROTONIX) 40 MG tablet Take 1 tablet (40 mg total) by mouth daily. 07/14/14   Azalee Course, PA   BP 127/59 mmHg  Pulse 52  Temp(Src) 98.5 F (36.9 C) (Oral)  Resp 11  SpO2 97% Physical Exam  Constitutional: He is oriented to person, place, and time. He appears well-developed and well-nourished. No distress.  HENT:  Head: Normocephalic and atraumatic.  Mouth/Throat: Oropharynx is clear and moist.  Eyes: Pupils are equal, round, and reactive to light.  Neck: Normal range of motion. Neck supple.  Cardiovascular: Normal rate, regular rhythm and normal heart sounds.  Exam reveals no gallop and no friction rub.   No murmur heard. Pulmonary/Chest: Effort normal and breath sounds normal. No respiratory distress. He has no wheezes.  Abdominal: Soft. Bowel sounds are normal. He exhibits no distension. There is no tenderness.  Neurological: He is alert and oriented to  person, place, and time. He exhibits normal muscle tone. Coordination normal.  Skin: Skin is warm and dry. No rash noted. No erythema.  Psychiatric: He has a normal mood and affect. His behavior is normal.  Nursing note and vitals reviewed.   ED Course  Procedures (including critical care time) Labs Review Labs Reviewed  BASIC METABOLIC PANEL - Abnormal; Notable for the following:    Glucose, Bld 134 (*)    All other components within normal limits  CBC  I-STAT TROPOININ, ED  Rosezena Sensor, ED    Imaging Review Dg  Chest 2 View  10/05/2014   CLINICAL DATA:  Shortness of breath and chest tightness since last night. Nausea. Smoker.  EXAM: CHEST  2 VIEW  COMPARISON:  07/13/2014 chest radiograph.  FINDINGS: Slightly low lung volumes on the PA view. Stable cardiomediastinal silhouette with normal heart size. No pneumothorax. No pleural effusion. Clear lungs, with no pulmonary edema. No focal lung consolidation.  IMPRESSION: No active disease in the chest.   Electronically Signed   By: Delbert Phenix M.D.   On: 10/05/2014 11:53   I have personally reviewed and evaluated these images and lab results as part of my medical decision-making.   EKG Interpretation   Date/Time:  Tuesday October 05 2014 10:21:18 EDT Ventricular Rate:  78 PR Interval:  142 QRS Duration: 96 QT Interval:  352 QTC Calculation: 401 R Axis:   54 Text Interpretation:  Normal sinus rhythm with sinus arrhythmia ST \\T \ T  wave abnormality, consider inferior ischemia ST \\T \ T wave abnormality,  consider anterolateral ischemia Abnormal ECG TWI unchanged  Confirmed by  YAO  MD, DAVID ([id number]) on 10/05/2014 10:29:25 AM      I feel with the patient's not having a cardiac event based on the fact that the pain has been constant for almost 2 days now.  He also states that the pain got worse today when he ate a large meal.  He spends be taking Protonix and has not been yes is has a component of anxiety.  I do believe to this because he does appear very anxious on exam.  Patient will be advised follow-up with his regular doctor and with the cardiologist for recheck    Charlestine Night, PA-C 10/05/14 1527  Richardean Canal, MD 10/05/14 [phone]

## 2014-12-24 ENCOUNTER — Emergency Department (HOSPITAL_COMMUNITY)
Admission: EM | Admit: 2014-12-24 | Discharge: 2014-12-24 | Disposition: A | Discharge status: Home / Self Care | Payer: Self-pay | Attending: Emergency Medicine | Admitting: Emergency Medicine

## 2014-12-24 ENCOUNTER — Encounter (HOSPITAL_COMMUNITY): Payer: Self-pay | Admitting: Emergency Medicine

## 2014-12-24 DIAGNOSIS — K029 Dental caries, unspecified: Secondary | ICD-10-CM | POA: Insufficient documentation

## 2014-12-24 DIAGNOSIS — K219 Gastro-esophageal reflux disease without esophagitis: Secondary | ICD-10-CM | POA: Insufficient documentation

## 2014-12-24 DIAGNOSIS — Z79899 Other long term (current) drug therapy: Secondary | ICD-10-CM | POA: Insufficient documentation

## 2014-12-24 DIAGNOSIS — F17210 Nicotine dependence, cigarettes, uncomplicated: Secondary | ICD-10-CM | POA: Insufficient documentation

## 2014-12-24 DIAGNOSIS — Z880 Allergy status to penicillin: Secondary | ICD-10-CM | POA: Insufficient documentation

## 2014-12-24 DIAGNOSIS — H9209 Otalgia, unspecified ear: Secondary | ICD-10-CM | POA: Insufficient documentation

## 2014-12-24 DIAGNOSIS — K0889 Other specified disorders of teeth and supporting structures: Secondary | ICD-10-CM | POA: Insufficient documentation

## 2014-12-24 DIAGNOSIS — Z7982 Long term (current) use of aspirin: Secondary | ICD-10-CM | POA: Insufficient documentation

## 2014-12-24 DIAGNOSIS — I209 Angina pectoris, unspecified: Secondary | ICD-10-CM | POA: Insufficient documentation

## 2014-12-24 MED ORDER — BUPIVACAINE-EPINEPHRINE (PF) 0.5% -1:200000 IJ SOLN
1.8000 mL | Freq: Once | INTRAMUSCULAR | Status: AC
  Administered 2014-12-24: 1.8 mL

## 2014-12-24 MED ORDER — CLINDAMYCIN HCL 150 MG PO CAPS
450.0000 mg | ORAL_CAPSULE | Freq: Three times a day (TID) | ORAL | Status: DC
Start: 2014-12-24 — End: 2015-03-01

## 2014-12-24 MED ORDER — OXYCODONE-ACETAMINOPHEN 5-325 MG PO TABS: 1.0000 | ORAL_TABLET | Freq: Three times a day (TID) | ORAL | Status: DC | PRN

## 2014-12-24 MED ORDER — PENICILLIN V POTASSIUM 250 MG PO TABS
500.0000 mg | ORAL_TABLET | Freq: Once | ORAL | Status: AC
  Administered 2014-12-24: 500 mg via ORAL
  Filled 2014-12-24: qty 2

## 2014-12-24 MED ORDER — OXYCODONE-ACETAMINOPHEN 5-325 MG PO TABS
2.0000 | ORAL_TABLET | Freq: Once | ORAL | Status: AC
  Administered 2014-12-24: 2 via ORAL
  Filled 2014-12-24: qty 2

## 2014-12-24 NOTE — Discharge Instructions (Signed)

## 2014-12-24 NOTE — ED Notes (Signed)
Pt able to ambulate independently.  Pt sleepy, but gait steady and even.  Asked patient again about SOB, itchiness, or other symptoms of a reaction to the dose of penicillin received.  Patients denies, and states "I'm fine".  This RN went over symptoms to watch for throughout the evening, and verbalized understanding.

## 2014-12-24 NOTE — ED Notes (Signed)
Pt. reports right lower molar pain " it broke off" onset 2 days ago unrelieved by OTC pain medication .

## 2014-12-24 NOTE — ED Provider Notes (Signed)
CSN: [id number]     Arrival date & time 12/24/14  0008 History   First MD Initiated Contact with Patient 12/24/14 0033     Chief Complaint  Patient presents with  . Dental Pain     (Consider location/radiation/quality/duration/timing/severity/associated sxs/prior Treatment) HPI  Pt is a 34 yearold male who presents to the ER for evaluation of dental pain of the right lower jaw, because of suspected moving molar tooth, which has gradually worsened with severe pain that today has radiated to most of the right side of his face, rated 10/10, with associated swelling of right cheek.  He can swallow, clear secretions, breath without difficulty.  He can open and close his mouth.  He has tried multiple home treatment, without relief.  He denies fever, N, V, however has not been able to each much today because of the pain.  He denies SOB, CP, abdominal pain, throat or neck pain.  He states that he will not have enough money to see a dentist for a few weeks.  Past Medical History  Diagnosis Date  . Family history of adverse reaction to anesthesia     "dad had allergic reaction to anesthesia"   . Anginal pain (HCC) 07/13/2014    "mild"  . GERD (gastroesophageal reflux disease)   . Diverticulitis    Past Surgical History  Procedure Laterality Date  . Cystectomy Left ~ 2014    "wrist"  . Cystectomy Left ~ 2014    "2 on my foot""  . Cardiac catheterization N/A 07/13/2014    Procedure: Left Heart Cath and Coronary Angiography;  Surgeon: Marykay Lex, MD;  Location: [facility] INVASIVE CV LAB;  Service: Cardiovascular;  Laterality: N/A;   No family history on file. Social History  Substance Use Topics  . Smoking status: Current Every Day Smoker -- 0.75 packs/day for 0 years    Types: Cigarettes  . Smokeless tobacco: Never Used  . Alcohol Use: No    Review of Systems  Constitutional: Negative.  Negative for fever, chills, diaphoresis and fatigue.  HENT: Positive for dental problem and ear pain.  Negative for mouth sores, sore throat, tinnitus, trouble swallowing and voice change.   Neurological: Negative.       Allergies  Penicillins  Home Medications   Prior to Admission medications   Medication Sig Start Date End Date Taking? Authorizing Provider  acetaminophen (TYLENOL) 325 MG tablet Take 2 tablets (650 mg total) by mouth every 6 (six) hours as needed. 12/28/14   Carlene Coria, PA-C  aspirin EC 81 MG EC tablet Take 1 tablet (81 mg total) by mouth daily. 07/15/14   Azalee Course, PA  atorvastatin (LIPITOR) 40 MG tablet Take 1 tablet (40 mg total) by mouth daily at 6 PM. 07/14/14   Azalee Course, PA  clindamycin (CLEOCIN) 150 MG capsule Take 3 capsules (450 mg total) by mouth 3 (three) times daily. 12/24/14   Danelle Berry, PA-C  famotidine (PEPCID) 20 MG tablet Take 1 tablet (20 mg total) by mouth 2 (two) times daily. 10/05/14   Charlestine Night, PA-C  ibuprofen (ADVIL,MOTRIN) 800 MG tablet Take 1 tablet (800 mg total) by mouth 3 (three) times daily. 12/28/14   Carlene Coria, PA-C  oxyCODONE-acetaminophen (PERCOCET) 5-325 MG tablet Take 1-2 tablets by mouth every 8 (eight) hours as needed for severe pain. 12/24/14   Danelle Berry, PA-C  pantoprazole (PROTONIX) 40 MG tablet Take 1 tablet (40 mg total) by mouth daily. 07/14/14   Azalee Course, PA  BP 131/69 mmHg  Pulse 63  Temp(Src) 98 F (36.7 C) (Oral)  Resp 18  SpO2 96% Physical Exam  Constitutional: He is oriented to person, place, and time. He appears well-developed and well-nourished. No distress.  HENT:  Head: Normocephalic and atraumatic.  Right Ear: External ear normal. No drainage or swelling.  Left Ear: External ear normal.  Nose: Nose normal.  Mouth/Throat: Uvula is midline, oropharynx is clear and moist and mucous membranes are normal. Mucous membranes are not pale, not dry and not cyanotic. No trismus in the jaw. Abnormal dentition. Dental caries present. No oropharyngeal exudate, posterior oropharyngeal edema, posterior  oropharyngeal erythema or tonsillar abscesses.    No sublingual tenderness  Eyes: Conjunctivae and EOM are normal. Pupils are equal, round, and reactive to light. Right eye exhibits no discharge. Left eye exhibits no discharge. No scleral icterus.  Neck: Trachea normal and normal range of motion. Neck supple. No JVD present. No rigidity. No tracheal deviation, no edema, no erythema and normal range of motion present.  Cardiovascular: Normal rate and regular rhythm.   Pulmonary/Chest: Effort normal and breath sounds normal. No stridor. No respiratory distress.  Musculoskeletal: Normal range of motion. He exhibits no edema.  Lymphadenopathy:    He has no cervical adenopathy.  Neurological: He is alert and oriented to person, place, and time. He exhibits normal muscle tone. Coordination normal.  Skin: Skin is warm and dry. No rash noted. He is not diaphoretic. No erythema. No pallor.  Psychiatric: He has a normal mood and affect. His behavior is normal. Judgment and thought content normal.  Nursing note and vitals reviewed.   ED Course  Procedures (including critical care time) NERVE BLOCK Performed by: Danelle BerryLeisa Kameko Hukill Consent: Verbal consent obtained. Required items: required blood products, implants, devices, and special equipment available Time out: Immediately prior to procedure a "time out" was called to verify the correct patient, procedure, equipment, support staff and site/side marked as required.  Indication: severe pain Nerve block body site: inferior alveolar nerve block, right   Preparation: Patient was prepped and draped in the usual sterile fashion. Needle gauge: 27 G Location technique: anatomical landmarks  Local anesthetic: bupivacaine-epinephrine (MARCAINE W/ EPI) 0.5% -1:200000 injection  Anesthetic total:  1.8 mL  Outcome: pain improved Patient tolerance: Patient tolerated the procedure well with no immediate complications.   Labs Review Labs Reviewed - No data  to display  Imaging Review No results found. I have personally reviewed and evaluated these images and lab results as part of my medical decision-making.   EKG Interpretation None      MDM   Pt complaining of severe dental pain originating at right lower jaw, 3rd molar, but radiating to his whole face.  No palpable abscess to drain. He was given pain medication and offered dental block. He did not have much improvement with oral medication, nerve block provided improved pain relief.  Pt discharged home with dental referral, pain medication, penicillin.   12/24/14 0227  98 F (36.7 C)  63  18  131/69 mmHg  96 %        12/24/14 0023  99.2 F (37.3 C)  68  16  146/93 mmHg  99 %           Final diagnoses:  Pain due to dental caries       Danelle BerryLeisa Faruq Rosenberger, PA-C 01/06/15 0315  Tomasita CrumbleAdeleke Oni, MD 01/07/15 [phone]

## 2014-12-28 ENCOUNTER — Emergency Department (HOSPITAL_COMMUNITY)
Admission: EM | Admit: 2014-12-28 | Discharge: 2014-12-28 | Disposition: A | Discharge status: Home / Self Care | Payer: Self-pay | Attending: Emergency Medicine | Admitting: Emergency Medicine

## 2014-12-28 ENCOUNTER — Encounter (HOSPITAL_COMMUNITY): Payer: Self-pay

## 2014-12-28 DIAGNOSIS — Z880 Allergy status to penicillin: Secondary | ICD-10-CM | POA: Insufficient documentation

## 2014-12-28 DIAGNOSIS — F17210 Nicotine dependence, cigarettes, uncomplicated: Secondary | ICD-10-CM | POA: Insufficient documentation

## 2014-12-28 DIAGNOSIS — K219 Gastro-esophageal reflux disease without esophagitis: Secondary | ICD-10-CM | POA: Insufficient documentation

## 2014-12-28 DIAGNOSIS — I209 Angina pectoris, unspecified: Secondary | ICD-10-CM | POA: Insufficient documentation

## 2014-12-28 DIAGNOSIS — Z7982 Long term (current) use of aspirin: Secondary | ICD-10-CM | POA: Insufficient documentation

## 2014-12-28 DIAGNOSIS — Z79899 Other long term (current) drug therapy: Secondary | ICD-10-CM | POA: Insufficient documentation

## 2014-12-28 DIAGNOSIS — K0889 Other specified disorders of teeth and supporting structures: Secondary | ICD-10-CM | POA: Insufficient documentation

## 2014-12-28 MED ORDER — IBUPROFEN 800 MG PO TABS: 800.0000 mg | ORAL_TABLET | Freq: Three times a day (TID) | ORAL | Status: DC

## 2014-12-28 MED ORDER — ACETAMINOPHEN 325 MG PO TABS: 650.0000 mg | ORAL_TABLET | Freq: Four times a day (QID) | ORAL | Status: DC | PRN

## 2014-12-28 NOTE — ED Notes (Signed)
Pt reports he was seen by dentist today who said he needs to have tooth removed by oral surgeon. Pain started on Friday. Pt took 3 ibuprofen and 1 extra strength tylenol.  Pt reports pain is in wisdom tooth.

## 2014-12-28 NOTE — Discharge Instructions (Signed)
You were seen in the emergency room today for evaluation of dental pain. As we discussed, there is not much I can offer to you here except for local anesthetic, which we decided to hold off on today. I will give you prescriptions for ibuprofen and acetaminophen. Please follow-up with your oral surgeon as soon as possible as the only way to fix the issue is to have your wisdom tooth extracted. You have no visible abscess of infection at this time so I will not be prescribing any antibiotics today.

## 2014-12-28 NOTE — ED Provider Notes (Signed)
CSN: [id number]     Arrival date & time 12/28/14  1117 History  By signing my name below, I, Kyle Silva, attest that this documentation has been prepared under the direction and in the presence of Noelle Penner, PA-C Electronically Signed: Charline Bills, ED Scribe 12/28/2014 at 11:38 AM.   Chief Complaint  Patient presents with  . Dental Pain   The history is provided by the patient. No language interpreter was used.   HPI Comments: Kyle Silva is a 34 y.o. male who presents to the Emergency Department complaining of constant, 7/10 right lower dental pain that radiates into right cheek for the past 5 days. Pt was seen in the ED 4 days ago for the same and referred to a dentist who he saw PTA. Dentist referred pt to oral surgery for wisdom tooth extraction but he could not be seen today. Pt was given ibuprofen and extra strength Tylenol at the dentist PTA but states that it has only provided mild relief. He denies difficulty swallowing.  Past Medical History  Diagnosis Date  . Family history of adverse reaction to anesthesia     "dad had allergic reaction to anesthesia"   . Anginal pain (HCC) 07/13/2014    "mild"  . GERD (gastroesophageal reflux disease)   . Diverticulitis    Past Surgical History  Procedure Laterality Date  . Cystectomy Left ~ 2014    "wrist"  . Cystectomy Left ~ 2014    "2 on my foot""  . Cardiac catheterization N/A 07/13/2014    Procedure: Left Heart Cath and Coronary Angiography;  Surgeon: Marykay Lex, MD;  Location: [facility] INVASIVE CV LAB;  Service: Cardiovascular;  Laterality: N/A;   No family history on file. Social History  Substance Use Topics  . Smoking status: Current Every Day Smoker -- 0.75 packs/day for 0 years    Types: Cigarettes  . Smokeless tobacco: Never Used  . Alcohol Use: No    Review of Systems  HENT: Positive for dental problem. Negative for trouble swallowing.   All other systems reviewed and are negative.  Allergies   Penicillins  Home Medications   Prior to Admission medications   Medication Sig Start Date End Date Taking? Authorizing Provider  aspirin EC 81 MG EC tablet Take 1 tablet (81 mg total) by mouth daily. 07/15/14   Azalee Course, PA  atorvastatin (LIPITOR) 40 MG tablet Take 1 tablet (40 mg total) by mouth daily at 6 PM. 07/14/14   Azalee Course, PA  clindamycin (CLEOCIN) 150 MG capsule Take 3 capsules (450 mg total) by mouth 3 (three) times daily. 12/24/14   Danelle Berry, PA-C  famotidine (PEPCID) 20 MG tablet Take 1 tablet (20 mg total) by mouth 2 (two) times daily. 10/05/14   Charlestine Night, PA-C  oxyCODONE-acetaminophen (PERCOCET) 5-325 MG tablet Take 1-2 tablets by mouth every 8 (eight) hours as needed for severe pain. 12/24/14   Danelle Berry, PA-C  pantoprazole (PROTONIX) 40 MG tablet Take 1 tablet (40 mg total) by mouth daily. 07/14/14   Azalee Course, PA   BP 138/92 mmHg  Pulse 68  Temp(Src) 97.8 F (36.6 C) (Oral)  Resp 16  Ht  (1.778 m)  Wt 274 lb (124.286 kg)  BMI 39.32 kg/m2  SpO2 100% Physical Exam  Constitutional: He is oriented to person, place, and time. He appears well-developed and well-nourished. No distress.  HENT:  Head: Normocephalic and atraumatic.  Partially impacted tooth #32. No visible abscess. No trismus, drooling.  Eyes: Conjunctivae  and EOM are normal.  Neck: Neck supple. No tracheal deviation present.  Cardiovascular: Normal rate.   Pulmonary/Chest: Effort normal. No respiratory distress.  Musculoskeletal: Normal range of motion.  Lymphadenopathy:    He has no cervical adenopathy.  Neurological: He is alert and oriented to person, place, and time.  Skin: Skin is warm and dry.  Psychiatric: He has a normal mood and affect. His behavior is normal.  Nursing note and vitals reviewed.  ED Course  Procedures (including critical care time) DIAGNOSTIC STUDIES: Oxygen Saturation is 100% on RA, normal by my interpretation.    COORDINATION OF CARE: 11:28 AM-Discussed  treatment plan which includes f/u with oral surgeon with pt at bedside and pt agreed to plan.   Labs Review Labs Reviewed - No data to display  Imaging Review No results found. I have personally reviewed and evaluated these images and lab results as part of my medical decision-making.   EKG Interpretation None      MDM   Final diagnoses:  Pain, dental    Pt with impacted wisdom tooth which is likely causing pain. No frank abscess to be drained. I offered inferior alveolar nerve block but pt declines at this time. Discussed that I cannot prescribe more narcotics (pt has been prescribed percocet int he past). Pt recently took ibuprofen so will hold off on toradol today. Will give rx for continued ibuprofen and acetaminophen. Instructed to f/u with OMFS when he can. ER return precautions given.    I personally performed the services described in this documentation, which was scribed in my presence. The recorded information has been reviewed and is accurate.   Carlene CoriaSerena Y Korry Dalgleish, PA-C 12/28/14 1251  Vanetta MuldersScott Zackowski, MD 12/29/14 [phone]

## 2015-03-01 ENCOUNTER — Encounter (HOSPITAL_COMMUNITY): Payer: Self-pay | Admitting: Emergency Medicine

## 2015-03-01 ENCOUNTER — Emergency Department (INDEPENDENT_AMBULATORY_CARE_PROVIDER_SITE_OTHER)
Admission: EM | Admit: 2015-03-01 | Discharge: 2015-03-01 | Disposition: A | Discharge status: Home / Self Care | Payer: Self-pay | Source: Home / Self Care | Attending: Emergency Medicine | Admitting: Emergency Medicine

## 2015-03-01 DIAGNOSIS — K115 Sialolithiasis: Secondary | ICD-10-CM

## 2015-03-01 DIAGNOSIS — K1120 Sialoadenitis, unspecified: Secondary | ICD-10-CM

## 2015-03-01 MED ORDER — IBUPROFEN 800 MG PO TABS
800.0000 mg | ORAL_TABLET | Freq: Three times a day (TID) | ORAL | Status: DC | PRN
Start: 2015-03-01 — End: 2015-07-18

## 2015-03-01 NOTE — ED Notes (Signed)
The patient presented to the [facility] with a complaint of neck pain and swelling that he stated he felt was a swollen gland that started today.

## 2015-03-01 NOTE — Discharge Instructions (Signed)
I do not think that this is infected yet, but it may become infected. Signs of infection will be redness and fever in addition to worsening pain. Stop smoking, increase fluids, apply moist heat to the area, and "milk" the salivary ducts underneath your tongue. Go to the ER for the signs and symptoms we discussed. Follow up with ENT in several days if not getting better.

## 2015-03-01 NOTE — ED Provider Notes (Signed)
HPI  SUBJECTIVE:  Kyle Silva is a 35 y.o. male who presents with painful swelling underneath his left jaw starting today. States that it fluctuates in size, especially before eating. States it is worse with eating, swelling, chewing, no alleviating factors. He has not tried anything for this. He reports dysphagia, but he is able to swallow liquids. No nausea, vomiting, fevers, redness, dental pain on the left side, no swelling underneath his tongue. No wheezing, stridor, neck stiffness. Patient has a past medical history of MI, medically managed. No history of diabetes, hypertension. He is not a smoker. PMD: None.   Past Medical History  Diagnosis Date  . Family history of adverse reaction to anesthesia     "dad had allergic reaction to anesthesia"   . Anginal pain (HCC) 07/13/2014    "mild"  . GERD (gastroesophageal reflux disease)   . Diverticulitis     Past Surgical History  Procedure Laterality Date  . Cystectomy Left ~ 2014    "wrist"  . Cystectomy Left ~ 2014    "2 on my foot""  . Cardiac catheterization N/A 07/13/2014    Procedure: Left Heart Cath and Coronary Angiography;  Surgeon: Marykay Lex, MD;  Location: [facility] INVASIVE CV LAB;  Service: Cardiovascular;  Laterality: N/A;    History reviewed. No pertinent family history.  Social History  Substance Use Topics  . Smoking status: Current Every Day Smoker -- 0.75 packs/day for 0 years    Types: Cigarettes  . Smokeless tobacco: Never Used  . Alcohol Use: No    No current facility-administered medications for this encounter.  Current outpatient prescriptions:  .  acetaminophen (TYLENOL) 325 MG tablet, Take 2 tablets (650 mg total) by mouth every 6 (six) hours as needed., Disp: 30 tablet, Rfl: 0 .  aspirin EC 81 MG EC tablet, Take 1 tablet (81 mg total) by mouth daily., Disp: , Rfl:  .  atorvastatin (LIPITOR) 40 MG tablet, Take 1 tablet (40 mg total) by mouth daily at 6 PM., Disp: 30 tablet, Rfl: 11 .  famotidine (PEPCID)  20 MG tablet, Take 1 tablet (20 mg total) by mouth 2 (two) times daily., Disp: 30 tablet, Rfl: 0 .  ibuprofen (ADVIL,MOTRIN) 800 MG tablet, Take 1 tablet (800 mg total) by mouth every 8 (eight) hours as needed., Disp: 20 tablet, Rfl: 0 .  pantoprazole (PROTONIX) 40 MG tablet, Take 1 tablet (40 mg total) by mouth daily., Disp: 30 tablet, Rfl: 5  Allergies  Allergen Reactions  . Penicillins Hives     ROS  As noted in HPI.   Physical Exam  BP 125/86 mmHg  Pulse 62  Temp(Src) 97.9 F (36.6 C) (Oral)  Resp 16  SpO2 100%  Constitutional: Well developed, well nourished, no acute distress. Voice normal. Eyes:  EOMI, conjunctiva normal bilaterally HENT: Normocephalic, atraumatic,mucus membranes moist. Tender swelling along the left submandibular gland. No expressible purulent drainage from [facility] or Stensen's duct. No palpable stone. No drooling. No trismus. Dentition left lower jaw normal. Lymph: No appreciable cervical lymphadenopathy. Respiratory: Normal inspiratory effort Cardiovascular: Normal rate GI: nondistended skin: No rash, skin intact Musculoskeletal: no deformities Neurologic: Alert & oriented x 3, no focal neuro deficits Psychiatric: Speech and behavior appropriate   ED Course   Medications - No data to display  No orders of the defined types were placed in this encounter.    No results found for this or any previous visit (from the past 24 hour(s)). No results found.  ED Clinical Impression  Salivary gland stone  Sialadenitis   ED Assessment/Plan  Presentation consistent with sialadenitis/salivary gland stone. No evidence of airway obstruction at this time. No evidence of Ludwig angina or dental infection. Instructed patient to increase fluids, apply moist heat to the area, massage the gland, milk duct, advised sour candies. Follow-up with Dr. Lazarus Salines, ENT on-call  in several days if no better. To the ER if gets worse or for any airway  impingement. Discussed MDM, plan and followup with patient.  Discussed sn/sx that should prompt return to the  ED. Patient  agrees with plan.  *This clinic note was created using Dragon dictation software. Therefore, there may be occasional mistakes despite careful proofreading.  ?   Domenick Gong, MD 03/02/15 1240

## 2015-03-02 ENCOUNTER — Encounter (HOSPITAL_COMMUNITY): Payer: Self-pay | Admitting: Emergency Medicine

## 2015-03-02 ENCOUNTER — Emergency Department (HOSPITAL_COMMUNITY)
Admission: EM | Admit: 2015-03-02 | Discharge: 2015-03-03 | Disposition: A | Discharge status: Home / Self Care | Payer: Self-pay | Attending: Emergency Medicine | Admitting: Emergency Medicine

## 2015-03-02 DIAGNOSIS — K0889 Other specified disorders of teeth and supporting structures: Secondary | ICD-10-CM | POA: Insufficient documentation

## 2015-03-02 DIAGNOSIS — Z880 Allergy status to penicillin: Secondary | ICD-10-CM | POA: Insufficient documentation

## 2015-03-02 DIAGNOSIS — Z79899 Other long term (current) drug therapy: Secondary | ICD-10-CM | POA: Insufficient documentation

## 2015-03-02 DIAGNOSIS — F17210 Nicotine dependence, cigarettes, uncomplicated: Secondary | ICD-10-CM | POA: Insufficient documentation

## 2015-03-02 DIAGNOSIS — R590 Localized enlarged lymph nodes: Secondary | ICD-10-CM | POA: Insufficient documentation

## 2015-03-02 DIAGNOSIS — I209 Angina pectoris, unspecified: Secondary | ICD-10-CM | POA: Insufficient documentation

## 2015-03-02 DIAGNOSIS — Z7982 Long term (current) use of aspirin: Secondary | ICD-10-CM | POA: Insufficient documentation

## 2015-03-02 DIAGNOSIS — K219 Gastro-esophageal reflux disease without esophagitis: Secondary | ICD-10-CM | POA: Insufficient documentation

## 2015-03-02 DIAGNOSIS — Z792 Long term (current) use of antibiotics: Secondary | ICD-10-CM | POA: Insufficient documentation

## 2015-03-02 DIAGNOSIS — K029 Dental caries, unspecified: Secondary | ICD-10-CM | POA: Insufficient documentation

## 2015-03-02 MED ORDER — BUPIVACAINE-EPINEPHRINE (PF) 0.5% -1:200000 IJ SOLN
1.8000 mL | Freq: Once | INTRAMUSCULAR | Status: AC
  Administered 2015-03-02: 1.8 mL
  Filled 2015-03-02: qty 1.8

## 2015-03-02 MED ORDER — NAPROXEN 500 MG PO TABS
500.0000 mg | ORAL_TABLET | Freq: Once | ORAL | Status: AC
  Administered 2015-03-02: 500 mg via ORAL
  Filled 2015-03-02: qty 1

## 2015-03-02 NOTE — ED Notes (Signed)
Pt is c/o toothache on the bottom right side and today he has swelling on the left side   Pt went to urgent care yesterday and was told he had a stone in his salivary gland on the left side

## 2015-03-02 NOTE — ED Provider Notes (Signed)
CSN: [id number]     Arrival date & time 03/02/15  2100 History  By signing my name below, I, [facility], attest that this documentation has been prepared under the direction and in the presence of [employer], PA-C. Electronically Signed: Randell Patient, ED Scribe. 03/02/2015. 12:12 AM.   Chief Complaint  Patient presents with  . Dental Pain    The history is provided by the patient, medical records and a significant other. No language interpreter was used.   HPI Comments: Kyle Silva is a 35 y.o. male who presents to the Emergency Department complaining of constant, mild, gradually worsening, shooting lower right dental pain that radiates to the left side of his face onset yesterday, worse today. Per medical records, patient was seen yesterday at Urgent Care for similar symptoms where he was diagnosed with left-sided sialadenitis, advised symptomatic treatment at home, and advised to follow-up with ENT  Dr. Lazarus Salines. Pain is worse with chewing and palpation. He endorses associated left-sided jaw pain and swelling. He has taken Tylenol without relief. Per significant other, patient has been seen by both dentist and an oral surgeon where he was told he needed to have his wisdom tooth extracted but that he has been unable to have this procedure due to lack of insurance. He denies any other symptoms currently.  Past Medical History  Diagnosis Date  . Family history of adverse reaction to anesthesia     "dad had allergic reaction to anesthesia"   . Anginal pain (HCC) 07/13/2014    "mild"  . GERD (gastroesophageal reflux disease)   . Diverticulitis    Past Surgical History  Procedure Laterality Date  . Cystectomy Left ~ 2014    "wrist"  . Cystectomy Left ~ 2014    "2 on my foot""  . Cardiac catheterization N/A 07/13/2014    Procedure: Left Heart Cath and Coronary Angiography;  Surgeon: Marykay Lex, MD;  Location: [facility] INVASIVE CV LAB;  Service: Cardiovascular;  Laterality: N/A;    History reviewed. No pertinent family history. Social History  Substance Use Topics  . Smoking status: Current Every Day Smoker -- 0.75 packs/day for 0 years    Types: Cigarettes  . Smokeless tobacco: Never Used  . Alcohol Use: No    Review of Systems  HENT: Positive for dental problem.   All other systems reviewed and are negative.     Allergies  Penicillins  Home Medications   Prior to Admission medications   Medication Sig Start Date End Date Taking? Authorizing Provider  acetaminophen (TYLENOL) 325 MG tablet Take 2 tablets (650 mg total) by mouth every 6 (six) hours as needed. 12/28/14   Carlene Coria, PA-C  aspirin EC 81 MG EC tablet Take 1 tablet (81 mg total) by mouth daily. 07/15/14   Azalee Course, PA  atorvastatin (LIPITOR) 40 MG tablet Take 1 tablet (40 mg total) by mouth daily at 6 PM. 07/14/14   Azalee Course, PA  clindamycin (CLEOCIN) 150 MG capsule Take 2 capsules (300 mg total) by mouth 3 (three) times daily. May dispense as 150mg  capsules 03/03/15   Antony Madura, PA-C  famotidine (PEPCID) 20 MG tablet Take 1 tablet (20 mg total) by mouth 2 (two) times daily. 10/05/14   Charlestine Night, PA-C  HYDROcodone-acetaminophen (NORCO/VICODIN) 5-325 MG tablet Take 1-2 tablets by mouth every 6 (six) hours as needed for severe pain. 03/03/15   Antony Madura, PA-C  ibuprofen (ADVIL,MOTRIN) 800 MG tablet Take 1 tablet (800 mg total) by mouth every  8 (eight) hours as needed. 03/01/15   Domenick Gong, MD  pantoprazole (PROTONIX) 40 MG tablet Take 1 tablet (40 mg total) by mouth daily. 07/14/14   Azalee Course, PA   BP 133/79 mmHg  Pulse 84  Temp(Src) 98.3 F (36.8 C) (Oral)  Resp 18  SpO2 98% Physical Exam  Constitutional: He is oriented to person, place, and time. He appears well-developed and well-nourished. No distress.  HENT:  Head: Normocephalic and atraumatic.  Mouth/Throat: Uvula is midline, oropharynx is clear and moist and mucous membranes are normal. No trismus in the jaw. Dental  caries present. No dental abscesses.    No gingival fluctuance. No trismus. Patient tolerating secretions without difficulty.  Eyes: Conjunctivae and EOM are normal. No scleral icterus.  Neck: Normal range of motion. Neck supple. No tracheal deviation present.  Pulmonary/Chest: Effort normal. No respiratory distress.  Respirations even and unlabored.  Musculoskeletal: Normal range of motion.  Lymphadenopathy:    He has cervical adenopathy.       Left cervical: Superficial cervical adenopathy present.  Tender, tonsillar lymphadenopathy  Neurological: He is alert and oriented to person, place, and time. He exhibits normal muscle tone. Coordination normal.  Skin: Skin is warm and dry. No rash noted. He is not diaphoretic. No erythema. No pallor.  Psychiatric: He has a normal mood and affect. His behavior is normal.  Nursing note and vitals reviewed.   ED Course  Dental Date/Time: 03/03/2015 12:14 AM Performed by: Antony Madura Authorized by: Antony Madura Consent: The procedure was performed in an emergent situation. Verbal consent obtained. Written consent not obtained. Risks and benefits: risks, benefits and alternatives were discussed Consent given by: patient Patient understanding: patient states understanding of the procedure being performed Patient consent: the patient's understanding of the procedure matches consent given Procedure consent: procedure consent matches procedure scheduled Relevant documents: relevant documents present and verified Test results: test results available and properly labeled Site marked: the operative site was marked Imaging studies: imaging studies available Required items: required blood products, implants, devices, and special equipment available Patient identity confirmed: verbally with patient and arm band Time out: Immediately prior to procedure a "time out" was called to verify the correct patient, procedure, equipment, support staff and  site/side marked as required. Preparation: Patient was prepped and draped in the usual sterile fashion. Local anesthesia used: yes Local anesthetic: bupivacaine 0.5% with epinephrine Anesthetic total: 1.2 ml Patient sedated: no Patient tolerance: Patient tolerated the procedure well with no immediate complications Comments: Inferior alveolar nerve block for dentalgia.     DIAGNOSTIC STUDIES: Oxygen Saturation is 98% on RA, normal by my interpretation.    COORDINATION OF CARE: 11:10 PM Will prescribe pain medication and antibiotics. Discussed administering dental block and pt agreed. Advised pt to follow up with a dentist. Discussed treatment plan with pt at bedside and pt agreed to plan.   Labs Review Labs Reviewed - No data to display  Imaging Review No results found.   I have personally reviewed and evaluated these images and lab results as part of my medical decision-making.   EKG Interpretation None      MDM   Final diagnoses:  Dentalgia    Patient with toothache. No gross abscess. Exam unconcerning for Ludwig's angina or spread of infection. Will treat with clindamycin and pain medicine. Urged patient to follow-up with dentist. Return precautions discussed and provided. Patient agreeable to plan with no unaddressed concerns. Patient discharged in good condition.  I personally performed the services described  in this documentation, which was scribed in my presence. The recorded information has been reviewed and is accurate.    Filed Vitals:   03/02/15 2116  BP: 133/79  Pulse: 84  Temp: 98.3 F (36.8 C)  TempSrc: Oral  Resp: 18  SpO2: 98%     Antony Madura, PA-C 03/03/15 0015  Tilden Fossa, MD 03/04/15 [phone]

## 2015-03-03 MED ORDER — CLINDAMYCIN HCL 300 MG PO CAPS
300.0000 mg | ORAL_CAPSULE | Freq: Once | ORAL | Status: AC
  Administered 2015-03-03: 300 mg via ORAL
  Filled 2015-03-03: qty 1

## 2015-03-03 MED ORDER — HYDROCODONE-ACETAMINOPHEN 5-325 MG PO TABS: 1.0000 | ORAL_TABLET | Freq: Four times a day (QID) | ORAL | Status: DC | PRN

## 2015-03-03 MED ORDER — CLINDAMYCIN HCL 150 MG PO CAPS: 300.0000 mg | ORAL_CAPSULE | Freq: Three times a day (TID) | ORAL | Status: DC

## 2015-03-03 NOTE — Discharge Instructions (Signed)
Dental Pain  ° ° °Dental pain may be caused by many things, including:  °Tooth decay (cavities or caries). Cavities expose the nerve of your tooth to air and hot or cold temperatures. This can cause pain or discomfort.  °Abscess or infection. A dental abscess is a collection of infected pus from a bacterial infection in the inner part of the tooth (pulp). It usually occurs at the end of the tooth's root.  °Injury.  °An unknown reason (idiopathic). °Your pain may be mild or severe. It may only occur when:  °You are chewing.  °You are exposed to hot or cold temperature.  °You are eating or drinking sugary foods or beverages, such as soda or candy. °Your pain may also be constant.  °HOME CARE INSTRUCTIONS  °Watch your dental pain for any changes. The following actions may help to lessen any discomfort that you are feeling:  °Take medicines only as directed by your dentist.  °If you were prescribed an antibiotic medicine, finish all of it even if you start to feel better.  °Keep all follow-up visits as directed by your dentist. This is important.  °Do not apply heat to the outside of your face.  °Rinse your mouth or gargle with salt water if directed by your dentist. This helps with pain and swelling.  °You can make salt water by adding ¼ tsp of salt to 1 cup of warm water. °Apply ice to the painful area of your face:  °Put ice in a plastic bag.  °Place a towel between your skin and the bag.  °Leave the ice on for 20 minutes, 2-3 times per day. °Avoid foods or drinks that cause you pain, such as:  °Very hot or very cold foods or drinks.  °Sweet or sugary foods or drinks. °SEEK MEDICAL CARE IF:  °Your pain is not controlled with medicines.  °Your symptoms are worse.  °You have new symptoms. °SEEK IMMEDIATE MEDICAL CARE IF:  °You are unable to open your mouth.  °You are having trouble breathing or swallowing.  °You have a fever.  °Your face, neck, or jaw is swollen. °This information is not intended to replace advice  given to you by your health care provider. Make sure you discuss any questions you have with your health care provider.  °Document Released: 12/25/2004 Document Revised: 05/11/2014 Document Reviewed: 12/21/2013  °Elsevier Interactive Patient Education ©2016 [employer]vier Inc[employer]  ° ° °Emergency Department Resource Guide °1) Find a Doctor and Pay Out of Pocket °Although you won't have to find out who is covered by your insurance plan, it is a good idea to ask around and get recommendations. You will then need to call the office and see if the doctor you have chosen will accept you as a new patient and what types of options they offer for patients who are self-pay. Some doctors offer discounts or will set up payment plans for their patients who do not have insurance, but you will need to ask so you aren't surprised when you get to your appointment. ° °2) Contact Your Local Health Department °Not all health departments have doctors that can see patients for sick visits, but many do, so it is worth a call to see if yours does. If you don't know where your local health department is, you can check in your phone book. The CDC also has a tool to help you locate your state's health department, and many state websites also have listings of all of their local health departments. ° °  3) Find a Walk-in Clinic °If your illness is not likely to be very severe or complicated, you may want to try a walk in clinic. These are popping up all over the country in pharmacies, drugstores, and shopping centers. They're usually staffed by nurse practitioners or physician assistants that have been trained to treat common illnesses and complaints. They're usually fairly quick and inexpensive. However, if you have serious medical issues or chronic medical problems, these are probably not your best option. ° °No Primary Care Doctor: °- Call [facility] at  832-8000 - they can help you locate a primary care doctor that  accepts your insurance,  provides certain services, etc. °- Physician Referral Service- 1-800-533-3463 ° °Chronic Pain Problems: °Organization         Address  Phone   Notes  °[facility]  (336) 297-2271 Patients need to be referred by their primary care doctor.  ° °Medication Assistance: °Organization         Address  Phone   Notes  °[place] [facility] [address] °[place], [place] [address] (336) 641-8030 --Must be a resident of [place] °-- Must have NO insurance coverage whatsoever (no Medicaid/ Medicare, etc.) °-- The pt. MUST have a primary care doctor that directs their care regularly and follows them in the community °  °[facility]  (866) 331-1348   °[employer]  (888) 892-1162   ° °Agencies that provide inexpensive medical care: °Organization         Address  Phone   Notes  °[facility]  (336) 832-8035   °[facility]    (336) 832-7272   °[facility] [address] °[place], [place] [address] (336) 832-4777   °[facility] [address], °[place] (336) 271-4999   °[facility]    (336) 373-0678   °[facility]    (336) 272-1050   °[facility] ° [address], [place] Phone:  (336) 832-4444, Fax:  (336) 832-4440 Hours of Operation:  9 am - 6 pm, M-F.  Also accepts Medicaid/Medicare and self-pay.  °[facility] ° [address], [place] Phone: (336) 832-3150, Fax: (336) 832-3151. Hours of Operation:  8:30 am - 5:30 pm, M-F.  Also accepts Medicaid and self-pay.  °[facility] [place] [address], [place] Phone: (336) 878-6027   °[facility] [address], [place], [place] (336)723-1848, Ext. 123 Mondays & Thursdays: 7-9 AM.  First 15 patients are seen on a first come, first serve basis. °  ° °Medicaid-accepting [place] Providers: ° °Organization         Address  Phone    Notes  °[facility] [address], [place] (336) 641-2100 Also accepts self-pay patients.  °[facility] [address], [place] ° (336) 856-9996   °[facility] [address], [place] (336) 288-8857   °[facility] [address] [place] [address], [place] (336) 299-7000   °Veita Bland [address], [place]  ° (336) 373-1557 Only accepts [place] Access Medicaid patients after they have their name applied to their card.  ° °Self-Pay (no insurance) in [place]: ° °Organization         Address  Phone   Notes  °Sickle Cell Patients, [facility] [address], [place] (336) 832-1970   °[facility] [address], [place] (336) 832-4400   °[facility] ° [address],  (336) 992-4800   °[facility]/Dr. Osei-Bonsu ° [address] [place] [address], [place] or [address], [place] (336) 841-8500 Phone number for both [place] and [place] locations is the same.  °[facility] [address], [place] (336) 299-0000   °[facility] [address] [place] [address], [place] or [address] (336) 852-7530 °(336) 878-2260   °[facility] [address], [place] (336) 350-1642, phone; (336) 294-5005, fax Sees patients 1st and 3rd Saturday of every month.  Must not qualify for public or private insurance (i.e. Medicaid, Medicare, [place] Health Choice, Veterans' Benefits) • Household income should be no more than 200% of the poverty level •The clinic cannot treat you if you are pregnant or think you are pregnant • Sexually transmitted diseases are not treated at the clinic.  ° ° °Dental Care: °Organization         Address  Phone  Notes  °[facility] [address], [place] (336)  641-6152 Accepts children up to age 21 who are enrolled in Medicaid or [place] Health Choice; pregnant women with a Medicaid card; and children who have applied for Medicaid or [place] Health Choice, but were declined, whose parents can pay a reduced fee at time of service.  °[facility] [place]  [address], [place] (336) 641-7733 Accepts children up to age 21 who are enrolled in Medicaid or [place] Health Choice; pregnant women with a Medicaid card; and children who have applied for Medicaid or [place] Health Choice, but were declined, whose parents can pay a reduced fee at time of service.  °[facility] ° [address], [place] (336) 641-4533 Patients are seen by appointment only. Walk-ins are not accepted. [facility] will see patients 18 years of age and older. °Monday - Tuesday (8am-5pm) °Most Wednesdays (8:30-5pm) °$30 per visit, cash only  °[facility] ° [address], [place] (336) 641-4533 Patients are seen by appointment only. Walk-ins are not accepted. [facility] will see patients 18 years of age and older. °One Wednesday Evening (Monthly: Volunteer Based).  $30 per visit, cash only  °[facility]  (919) 537-3737 for adults; Children under age 4, call Graduate Pediatric Dentistry at ([phone]) [phone]. Children aged 4-14, please call ([phone]) [phone] to request a pediatric application. ° Dental services are provided in all areas of dental care including fillings, crowns and bridges, complete and partial dentures, implants, gum treatment, root canals, and extractions. Preventive care is also provided. Treatment is provided to both adults and children. °Patients are selected via a lottery and there is often a waiting list. °  °[facility] [address], ° ° (336) 763-8833 [email or web address] °  °[facility] [address], [place], [place] (336)723-1848, Ext.  123 Second and Fourth Thursday of each month, opens at 6:30 AM; Clinic ends at 9 AM.  Patients are seen on a first-come first-served basis, and a limited number are seen during each clinic.  ° °[facility] ° [address], [place], [place] (336) 723-7904   Eligibility Requirements °You must have lived in [place],   [place], or [place] counties for at least the last three months. °  You cannot be eligible for state or federal sponsored healthcare insurance, including Veterans Administration, Medicaid, or Medicare. °  You generally cannot be eligible for healthcare insurance through your employer.  °  How to apply: °Eligibility screenings are held every Tuesday and Wednesday afternoon from 1:00 pm until 4:00 pm. You do not need an appointment for the interview!  °[facility] [address], [place], [place] 336-631-2330   °[facility]  336-342-8273   °[facility]  336-703-3100   °[facility]  336-570-6415   ° °Behavioral Health Resources in the Community: °Intensive Outpatient Programs °Organization         Address  Phone  Notes  °[facility] [address], [place], [place] 336-878-6098   °[facility] [address], [place], [place] 336-832-9800   °[facility] [address], [place], [place] ° 336-882-2125   °[facility] [address],  °[place], [place] 1-800-853-5163 or 336-641-4981   °Substance Abuse Resources °Organization         Address  Phone  Notes  °[facility]  336-882-2125   °[facility]  336-784-9470   °[facility]  336-285-9073   °[facility]  336-845-3988   °Residential & Outpatient Substance Abuse Program  1-800-659-3381   °Psychological Services °Organization         Address  Phone  Notes  ° Health  336- 832-9600   °[facility]  336- 378-7881   °[place] [address], [place] 1-800-853-5163 or 336-641-4981   ° °Mobile Crisis Teams °Organization         Address  Phone  Notes  °[facility], Mobile Crisis Care Unit  1-877-626-1772   °Assertive °[facility] ° [address] [place], [place] 336-834-9664   °Sharon DeEsch [address] °[place] 336-554-5454   ° °Self-Help/Support Groups °Organization         Address  Phone             Notes  °[facility] - variety of support groups  336- 373-1402 Call for more information  °Narcotics Anonymous (NA), [facility] [address], °[place]  2 meetings at this location  ° °Residential Treatment Programs °Organization         Address  Phone  Notes  °[facility] [address],    °[place]  1-866-801-8205   °[facility] ° [address], [place], [place] 704-293-8524   °[facility] [address], [place] 336-845-3988 Admissions: 8am-3pm M-F  °[facility] [address]    °[place], [place] 336-841-1104   °[facility] [address], [place], [place] 336-379-7146   °[facility] [address]  °[place], [place] 336-285-9073   °[facility] - Intensive Outpatient [address], [place], [place] 336-852-3033   °[facility] [address]  °[place], [place] 1-877-615-2722 or 336-784-9470   °[facility] [address] [place], [place] 336-227-7417 Accepts Medicaid  °[facility] [address]  °[place]  1-800-659-3381 Substance Abuse/Addiction Treatment  ° °[place] Behavioral Health Resources °Organization         Address  Phone  Notes  °[facility]  (888) 581-9988   °Julie Brannon, PhD [address] [place], [place]   (336) 349-5553 or (336) 951-0000   °[facility]   [address] °[place], [place] (336) 349-4454   °[facility] [address], [place], [place] (336) 342-8316 Insurance/Medicaid/sponsorship through [facility]  °[facility] [address]                                    [place], [place] (336) 342-8316 Therapy/tele-psych/case  °[facility] [address]  ° [place], [place] (336) 349-2233    °Dr. Arfeen  (336) 349-4544   °[facility] [place]  [employer] [place] [facility] 1) [address], [place] °2) [address], [place] °3)  [address], [place] (336) 349-3220 °(336) 342-7768 ° °(336) 342-8140   °[place] [facility] (336) 342-1394 or (336) 342-3537 (After Hours)    ° ° ° °

## 2015-07-17 ENCOUNTER — Emergency Department (HOSPITAL_COMMUNITY): Payer: Self-pay

## 2015-07-17 ENCOUNTER — Encounter (HOSPITAL_COMMUNITY): Payer: Self-pay | Admitting: Emergency Medicine

## 2015-07-17 ENCOUNTER — Emergency Department (HOSPITAL_COMMUNITY)
Admission: EM | Admit: 2015-07-17 | Discharge: 2015-07-18 | Disposition: A | Discharge status: Home / Self Care | Payer: Self-pay | Attending: Emergency Medicine | Admitting: Emergency Medicine

## 2015-07-17 DIAGNOSIS — B349 Viral infection, unspecified: Secondary | ICD-10-CM | POA: Insufficient documentation

## 2015-07-17 DIAGNOSIS — R519 Headache, unspecified: Secondary | ICD-10-CM

## 2015-07-17 DIAGNOSIS — F17210 Nicotine dependence, cigarettes, uncomplicated: Secondary | ICD-10-CM | POA: Insufficient documentation

## 2015-07-17 DIAGNOSIS — Z79899 Other long term (current) drug therapy: Secondary | ICD-10-CM | POA: Insufficient documentation

## 2015-07-17 DIAGNOSIS — R251 Tremor, unspecified: Secondary | ICD-10-CM | POA: Insufficient documentation

## 2015-07-17 DIAGNOSIS — R51 Headache: Secondary | ICD-10-CM

## 2015-07-17 DIAGNOSIS — Z7982 Long term (current) use of aspirin: Secondary | ICD-10-CM | POA: Insufficient documentation

## 2015-07-17 LAB — CBG MONITORING, ED: GLUCOSE-CAPILLARY: 140 mg/dL — AB (ref 65–99)

## 2015-07-17 LAB — BASIC METABOLIC PANEL
ANION GAP: 7 (ref 5–15)
BUN: 9 mg/dL (ref 6–20)
CHLORIDE: 104 mmol/L (ref 101–111)
CO2: 22 mmol/L (ref 22–32)
Calcium: 8.8 mg/dL — ABNORMAL LOW (ref 8.9–10.3)
Creatinine, Ser: 1.01 mg/dL (ref 0.61–1.24)
GFR calc Af Amer: >60 mL/min (ref >60)
GLUCOSE: 118 mg/dL — AB (ref 65–99)
Potassium: 3.4 mmol/L — ABNORMAL LOW (ref 3.5–5.1)
SODIUM: 133 mmol/L — AB (ref 135–145)

## 2015-07-17 LAB — CBC
HEMATOCRIT: 39.3 % (ref 39.0–52.0)
HEMOGLOBIN: 13.9 g/dL (ref 13.0–17.0)
MCH: 30.3 pg (ref 26.0–34.0)
MCHC: 35.4 g/dL (ref 30.0–36.0)
MCV: 85.6 fL (ref 78.0–100.0)
Platelets: 211 10*3/uL (ref 150–400)
RBC: 4.59 MIL/uL (ref 4.22–5.81)
RDW: 13.3 % (ref 11.5–15.5)
WBC: 10.4 10*3/uL (ref 4.0–10.5)

## 2015-07-17 IMAGING — CR DG CHEST 2V
2 series · 2 of 2 positions shown · non-contrast
Comparison: [DATE]

CLINICAL DATA: Mid chest pain, shortness of breath, fever, and
chills for 1 day. Headache and fatigue. Dizzy spells.

EXAM:
CHEST  2 VIEW

[w chest lat]
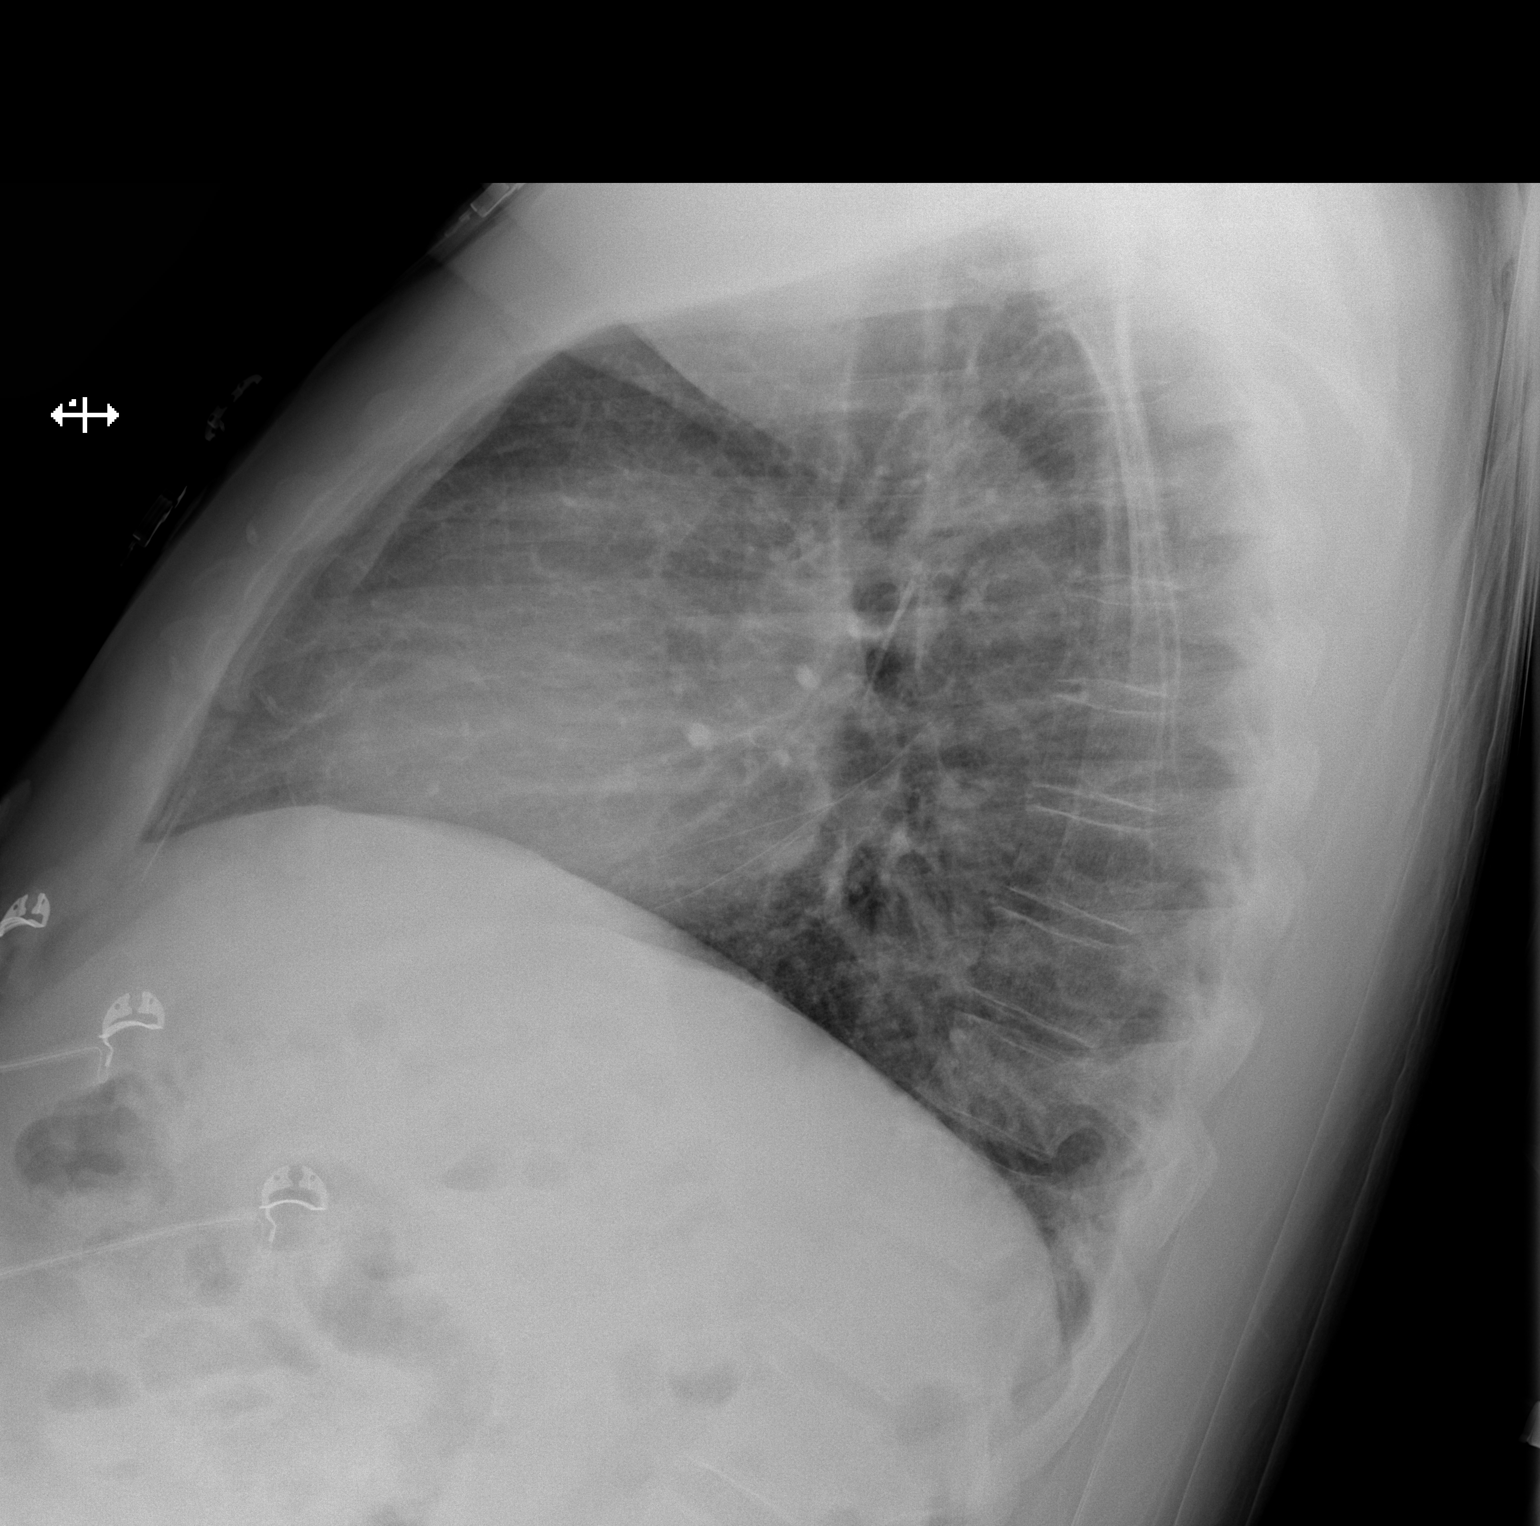

[x chest ap]
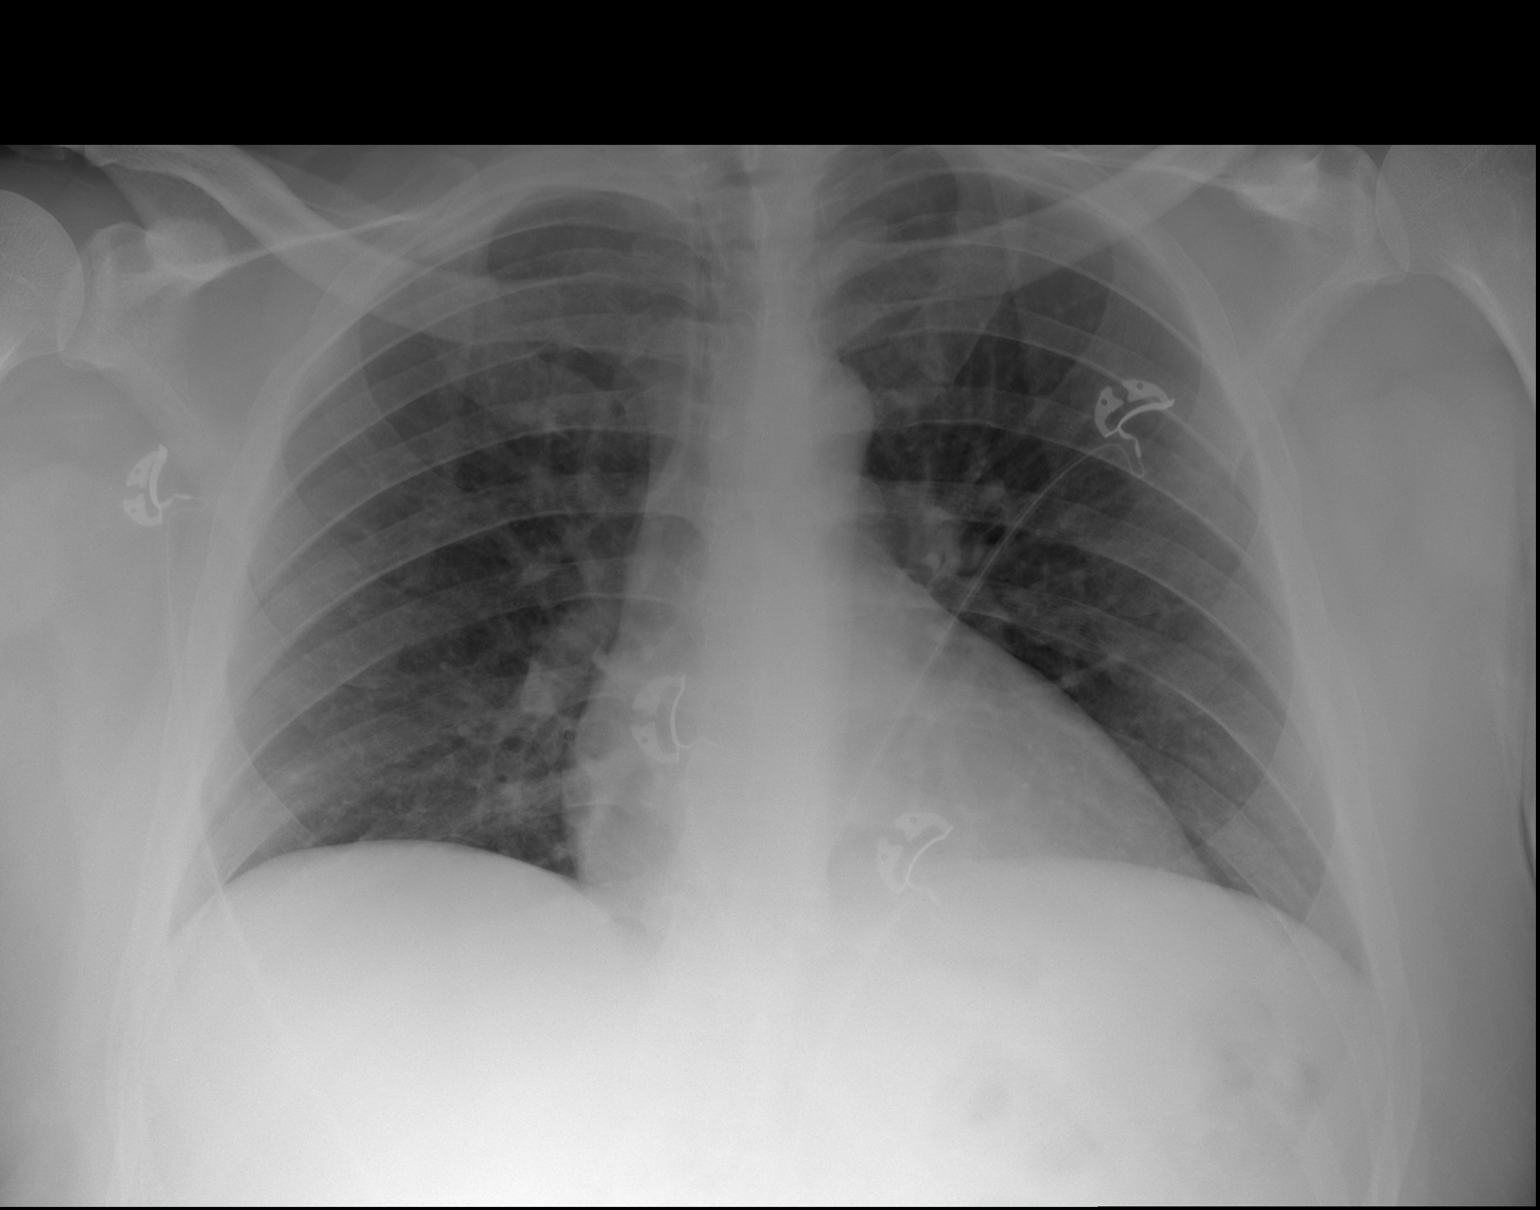

[2 of 2 positions shown; findings below may reference images not displayed]

FINDINGS: The heart size and mediastinal contours are within normal limits.
Both lungs are clear. The visualized skeletal structures are
unremarkable.
IMPRESSION: No active cardiopulmonary disease.

## 2015-07-17 MED ORDER — SODIUM CHLORIDE 0.9 % IV SOLN
1000.0000 mL | INTRAVENOUS | Status: DC
  Administered 2015-07-18: 1000 mL via INTRAVENOUS

## 2015-07-17 MED ORDER — DIPHENHYDRAMINE HCL 50 MG/ML IJ SOLN
25.0000 mg | Freq: Once | INTRAMUSCULAR | Status: AC
  Administered 2015-07-17: 25 mg via INTRAVENOUS
  Filled 2015-07-17: qty 1

## 2015-07-17 MED ORDER — SODIUM CHLORIDE 0.9 % IV SOLN
1000.0000 mL | Freq: Once | INTRAVENOUS | Status: AC
  Administered 2015-07-17: 1000 mL via INTRAVENOUS

## 2015-07-17 MED ORDER — KETOROLAC TROMETHAMINE 30 MG/ML IJ SOLN
30.0000 mg | Freq: Once | INTRAMUSCULAR | Status: AC
  Administered 2015-07-17: 30 mg via INTRAVENOUS
  Filled 2015-07-17: qty 1

## 2015-07-17 MED ORDER — METOCLOPRAMIDE HCL 5 MG/ML IJ SOLN
10.0000 mg | Freq: Once | INTRAMUSCULAR | Status: AC
  Administered 2015-07-17: 10 mg via INTRAVENOUS
  Filled 2015-07-17: qty 2

## 2015-07-17 NOTE — ED Provider Notes (Addendum)
CSN: [id number]     Arrival date & time 07/17/15  2203 History  By signing my name below, I, [facility], attest that this documentation has been prepared under the direction and in the presence of Dione Booze, MD. Electronically Signed: Randell Patient, ED Scribe. 07/18/2015. 12:56 AM.   Chief Complaint  Patient presents with  . Headache  . Shaking    The history is provided by the patient. No language interpreter was used.   HPI Comments: Kyle Silva is a 35 y.o. male with a hx of anginal pain, GERD, and diverticulitis who presents to the Emergency Department complaining of constant, moderate HA onset early this morning. Pt states that he has had a HA with tingling to the top and back of his head and two episodes of generalized tremors to his entire body today. He reports associated chills, subjective fever, fatigue, generalized body aches, and a feeling as if "everything is moving in slow motion." He has taken an OTC migraine and fever medication x2, once this morning and once this evening, with slight relief. He is a current cigarette smoker and smokes 0.5 ppd. Denies illicit drug use and ETOH consumption. Denies nausea, blurred vision, double vision, weakness, numbness, cough, rhinorrhea, and nasal congestion.  Past Medical History  Diagnosis Date  . Family history of adverse reaction to anesthesia     "dad had allergic reaction to anesthesia"   . Anginal pain (HCC) 07/13/2014    "mild"  . GERD (gastroesophageal reflux disease)   . Diverticulitis    Past Surgical History  Procedure Laterality Date  . Cystectomy Left ~ 2014    "wrist"  . Cystectomy Left ~ 2014    "2 on my foot""  . Cardiac catheterization N/A 07/13/2014    Procedure: Left Heart Cath and Coronary Angiography;  Surgeon: Marykay Lex, MD;  Location: [facility] INVASIVE CV LAB;  Service: Cardiovascular;  Laterality: N/A;   No family history on file. Social History  Substance Use Topics  . Smoking status: Current  Every Day Smoker -- 0.50 packs/day for 0 years    Types: Cigarettes  . Smokeless tobacco: Never Used  . Alcohol Use: No    Review of Systems  Constitutional: Positive for fever, chills and fatigue.  HENT: Negative for congestion and rhinorrhea.   Eyes: Negative for visual disturbance.  Respiratory: Negative for cough.   Musculoskeletal: Positive for myalgias (generalized body aches).  Neurological: Positive for tremors and headaches. Negative for weakness and numbness.  All other systems reviewed and are negative.     Allergies  Penicillins  Home Medications   Prior to Admission medications   Medication Sig Start Date End Date Taking? Authorizing Provider  aspirin-acetaminophen-caffeine (EXCEDRIN MIGRAINE) [phone] MG tablet Take 1-2 tablets by mouth every 6 (six) hours as needed for headache.   Yes Historical Provider, MD  pantoprazole (PROTONIX) 40 MG tablet Take 1 tablet (40 mg total) by mouth daily. Patient taking differently: Take 40 mg by mouth daily as needed (for heartburn/acid reflux.).  07/14/14  Yes Azalee Course, PA  acetaminophen (TYLENOL) 325 MG tablet Take 2 tablets (650 mg total) by mouth every 6 (six) hours as needed. Patient not taking: Reported on 07/17/2015 12/28/14   Carlene Coria, PA-C  aspirin EC 81 MG EC tablet Take 1 tablet (81 mg total) by mouth daily. Patient not taking: Reported on 07/17/2015 07/15/14   Azalee Course, PA  atorvastatin (LIPITOR) 40 MG tablet Take 1 tablet (40 mg total) by mouth daily at  6 PM. Patient not taking: Reported on 07/17/2015 07/14/14   Azalee CourseHao Meng, PA  clindamycin (CLEOCIN) 150 MG capsule Take 2 capsules (300 mg total) by mouth 3 (three) times daily. May dispense as 150mg  capsules Patient not taking: Reported on 07/17/2015 03/03/15   Antony MaduraKelly Humes, PA-C  famotidine (PEPCID) 20 MG tablet Take 1 tablet (20 mg total) by mouth 2 (two) times daily. Patient not taking: Reported on 07/17/2015 10/05/14   Charlestine Nighthristopher Lawyer, PA-C  HYDROcodone-acetaminophen  (NORCO/VICODIN) 5-325 MG tablet Take 1-2 tablets by mouth every 6 (six) hours as needed for severe pain. Patient not taking: Reported on 07/17/2015 03/03/15   Antony MaduraKelly Humes, PA-C  ibuprofen (ADVIL,MOTRIN) 800 MG tablet Take 1 tablet (800 mg total) by mouth every 8 (eight) hours as needed. Patient not taking: Reported on 07/17/2015 03/01/15   Domenick GongAshley Mortenson, MD   BP 127/96 mmHg  Pulse 109  Temp(Src) 98.6 F (37 C) (Oral)  Resp 16  Ht 5\' 10"  (1.778 m)  Wt 250 lb (113.399 kg)  BMI 35.87 kg/m2  SpO2 97% Physical Exam  Constitutional: He is oriented to person, place, and time. He appears well-developed and well-nourished. No distress.  HENT:  Head: Normocephalic and atraumatic.  Eyes: Conjunctivae and EOM are normal. Pupils are equal, round, and reactive to light.  Neck: Normal range of motion. Neck supple. No JVD present.  Cardiovascular: Normal rate, regular rhythm and normal heart sounds.   No murmur heard. Pulmonary/Chest: Effort normal and breath sounds normal. He has no wheezes. He has no rales. He exhibits no tenderness.  Abdominal: Soft. Bowel sounds are normal. He exhibits no distension and no mass. There is no tenderness.  Musculoskeletal: Normal range of motion. He exhibits no edema.  Lymphadenopathy:    He has no cervical adenopathy.  Neurological: He is alert and oriented to person, place, and time. No cranial nerve deficit. He exhibits normal muscle tone. Coordination normal.  Skin: Skin is warm and dry. No rash noted.  Psychiatric: He has a normal mood and affect. His behavior is normal. Judgment and thought content normal.  Nursing note and vitals reviewed.   ED Course  Procedures  DIAGNOSTIC STUDIES: Oxygen Saturation is 97% on RA, normal by my interpretation.    COORDINATION OF CARE: 11:27 PM Discussed results of EKG. Will order chest x-ray, IV fluids, Reglan, Benadryl, Toradol, and labs. Discussed treatment plan with pt at bedside and pt agreed to plan.   Labs  Review Results for orders placed or performed during the hospital encounter of 07/17/15  Basic metabolic panel  Result Value Ref Range   Sodium 133 (L) 135 - 145 mmol/L   Potassium 3.4 (L) 3.5 - 5.1 mmol/L   Chloride 104 101 - 111 mmol/L   CO2 22 22 - 32 mmol/L   Glucose, Bld 118 (H) 65 - 99 mg/dL   BUN 9 6 - 20 mg/dL   Creatinine, Ser 1.611.01 0.61 - 1.24 mg/dL   Calcium 8.8 (L) 8.9 - 10.3 mg/dL   GFR calc non Af Amer >60 >60 mL/min   GFR calc Af Amer >60 >60 mL/min   Anion gap 7 5 - 15  CBC  Result Value Ref Range   WBC 10.4 4.0 - 10.5 K/uL   RBC 4.59 4.22 - 5.81 MIL/uL   Hemoglobin 13.9 13.0 - 17.0 g/dL   HCT 09.639.3 04.539.0 - 40.952.0 %   MCV 85.6 78.0 - 100.0 fL   MCH 30.3 26.0 - 34.0 pg   MCHC 35.4 30.0 -  36.0 g/dL   RDW 16.1 09.6 - 04.5 %   Platelets 211 150 - 400 K/uL  Differential  Result Value Ref Range   Neutrophils Relative % 85 %   Neutro Abs 8.7 (H) 1.7 - 7.7 K/uL   Lymphocytes Relative 7 %   Lymphs Abs 0.8 0.7 - 4.0 K/uL   Monocytes Relative 8 %   Monocytes Absolute 0.9 0.1 - 1.0 K/uL   Eosinophils Relative 0 %   Eosinophils Absolute 0.0 0.0 - 0.7 K/uL   Basophils Relative 0 %   Basophils Absolute 0.0 0.0 - 0.1 K/uL  CBG monitoring, ED  Result Value Ref Range   Glucose-Capillary 140 (H) 65 - 99 mg/dL   Imaging Review Dg Chest 2 View  07/18/2015  CLINICAL DATA:  Mid chest pain, shortness of breath, fever, and chills for 1 day. Headache and fatigue. Dizzy spells. EXAM: CHEST  2 VIEW COMPARISON:  10/05/2014 FINDINGS: The heart size and mediastinal contours are within normal limits. Both lungs are clear. The visualized skeletal structures are unremarkable. IMPRESSION: No active cardiopulmonary disease. Electronically Signed   By: Burman Nieves M.D.   On: 07/18/2015 00:47   I have personally reviewed and evaluated these images and lab results as part of my medical decision-making.   EKG Interpretation   Date/Time:  Sunday July 17 2015 22:21:32 EDT Ventricular Rate:   99 PR Interval:    QRS Duration: 83 QT Interval:  317 QTC Calculation: 407 R Axis:   81 Text Interpretation:  Sinus rhythm Borderline repolarization abnormality  Minimal ST elevation, anterior leads When compared with ECG of 10/05/2014,  T wave inversion T wave inversion less evident in Anterolateral leads  Confirmed by [facility]  MD, Anikah Hogge ([id number]) on 07/17/2015 11:30:34 PM      MDM   Final diagnoses:  Viral syndrome  Headache, unspecified headache type    Subjective fever with sweats and body aches suggestive of viral illness. Headache which seems to part of the same symptom complex. No retroflexed to suggest more serious illness. Will check chest x-ray and urinalysis for signs of occult infection. He'll be given a migraine cocktail of normal saline, metoclopramide, diphenhydramine, and ketorolac. 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  He feels much better after above noted treatment and is resting comfortably. Heart rate has come down. Chest x-ray shows no evidence of pneumonia and urinalysis is normal. He is discharged with instructions to use over-the-counter analgesics and antipyretics as needed.  I personally performed the services described in this documentation, which was scribed in my presence. The recorded information has been reviewed and is accurate.      Dione Booze, MD 07/18/15 1914  Dione Booze, MD 07/18/15 [phone]

## 2015-07-17 NOTE — ED Notes (Signed)
Pt states last night he started getting a headache, became fatigued, and got "the shakes".  He also states that he has had some dizzy spells.

## 2015-07-17 NOTE — ED Notes (Signed)
Patient transported to X-ray 

## 2015-07-18 ENCOUNTER — Encounter (HOSPITAL_COMMUNITY): Payer: Self-pay | Admitting: Emergency Medicine

## 2015-07-18 ENCOUNTER — Emergency Department (HOSPITAL_COMMUNITY)
Admission: EM | Admit: 2015-07-18 | Discharge: 2015-07-18 | Disposition: A | Discharge status: Home / Self Care | Payer: Self-pay | Attending: Emergency Medicine | Admitting: Emergency Medicine

## 2015-07-18 DIAGNOSIS — F17210 Nicotine dependence, cigarettes, uncomplicated: Secondary | ICD-10-CM | POA: Insufficient documentation

## 2015-07-18 DIAGNOSIS — R51 Headache: Secondary | ICD-10-CM | POA: Insufficient documentation

## 2015-07-18 DIAGNOSIS — R197 Diarrhea, unspecified: Secondary | ICD-10-CM | POA: Insufficient documentation

## 2015-07-18 DIAGNOSIS — M791 Myalgia, unspecified site: Secondary | ICD-10-CM

## 2015-07-18 DIAGNOSIS — Z79899 Other long term (current) drug therapy: Secondary | ICD-10-CM | POA: Insufficient documentation

## 2015-07-18 DIAGNOSIS — R1110 Vomiting, unspecified: Secondary | ICD-10-CM

## 2015-07-18 DIAGNOSIS — R509 Fever, unspecified: Secondary | ICD-10-CM | POA: Insufficient documentation

## 2015-07-18 LAB — COMPREHENSIVE METABOLIC PANEL
ALBUMIN: 4.2 g/dL (ref 3.5–5.0)
ALT: 88 U/L — AB (ref 17–63)
AST: 58 U/L — AB (ref 15–41)
Alkaline Phosphatase: 71 U/L (ref 38–126)
Anion gap: 8 (ref 5–15)
BUN: 10 mg/dL (ref 6–20)
CHLORIDE: 103 mmol/L (ref 101–111)
CO2: 25 mmol/L (ref 22–32)
CREATININE: 1.2 mg/dL (ref 0.61–1.24)
Calcium: 9 mg/dL (ref 8.9–10.3)
GFR calc Af Amer: >60 mL/min (ref >60)
GLUCOSE: 85 mg/dL (ref 65–99)
POTASSIUM: 4.1 mmol/L (ref 3.5–5.1)
Sodium: 136 mmol/L (ref 135–145)
Total Bilirubin: 1 mg/dL (ref 0.3–1.2)
Total Protein: 8 g/dL (ref 6.5–8.1)

## 2015-07-18 LAB — URINALYSIS, ROUTINE W REFLEX MICROSCOPIC
BILIRUBIN URINE: NEGATIVE
GLUCOSE, UA: NEGATIVE mg/dL
HGB URINE DIPSTICK: NEGATIVE
Ketones, ur: NEGATIVE mg/dL
Leukocytes, UA: NEGATIVE
Nitrite: NEGATIVE
PROTEIN: NEGATIVE mg/dL
SPECIFIC GRAVITY, URINE: 1.005 (ref 1.005–1.030)
pH: 6 (ref 5.0–8.0)

## 2015-07-18 LAB — DIFFERENTIAL
BASOS ABS: 0 10*3/uL (ref 0.0–0.1)
BASOS PCT: 0 %
EOS ABS: 0 10*3/uL (ref 0.0–0.7)
Eosinophils Relative: 0 %
LYMPHS ABS: 0.8 10*3/uL (ref 0.7–4.0)
Lymphocytes Relative: 7 %
MONOS PCT: 8 %
Monocytes Absolute: 0.9 10*3/uL (ref 0.1–1.0)
NEUTROS ABS: 8.7 10*3/uL — AB (ref 1.7–7.7)
NEUTROS PCT: 85 %

## 2015-07-18 LAB — CBC WITH DIFFERENTIAL/PLATELET
BASOS ABS: 0 10*3/uL (ref 0.0–0.1)
BASOS PCT: 0 %
Eosinophils Absolute: 0 10*3/uL (ref 0.0–0.7)
Eosinophils Relative: 0 %
HEMATOCRIT: 44.6 % (ref 39.0–52.0)
HEMOGLOBIN: 15.4 g/dL (ref 13.0–17.0)
Lymphocytes Relative: 16 %
Lymphs Abs: 1.2 10*3/uL (ref 0.7–4.0)
MCH: 30.9 pg (ref 26.0–34.0)
MCHC: 34.5 g/dL (ref 30.0–36.0)
MCV: 89.4 fL (ref 78.0–100.0)
Monocytes Absolute: 0.6 10*3/uL (ref 0.1–1.0)
Monocytes Relative: 8 %
NEUTROS ABS: 5.6 10*3/uL (ref 1.7–7.7)
NEUTROS PCT: 76 %
Platelets: 215 10*3/uL (ref 150–400)
RBC: 4.99 MIL/uL (ref 4.22–5.81)
RDW: 13.4 % (ref 11.5–15.5)
WBC: 7.4 10*3/uL (ref 4.0–10.5)

## 2015-07-18 LAB — I-STAT CG4 LACTIC ACID, ED: Lactic Acid, Venous: 1.7 mmol/L (ref 0.5–1.9)

## 2015-07-18 LAB — LIPASE, BLOOD: LIPASE: 33 U/L (ref 11–51)

## 2015-07-18 MED ORDER — DIPHENHYDRAMINE HCL 50 MG/ML IJ SOLN: 25.0000 mg | Freq: Once | INTRAMUSCULAR | Status: DC

## 2015-07-18 MED ORDER — KETOROLAC TROMETHAMINE 30 MG/ML IJ SOLN
30.0000 mg | Freq: Once | INTRAMUSCULAR | Status: AC
  Administered 2015-07-18: 30 mg via INTRAVENOUS
  Filled 2015-07-18: qty 1

## 2015-07-18 MED ORDER — ONDANSETRON HCL 4 MG/2ML IJ SOLN
4.0000 mg | Freq: Once | INTRAMUSCULAR | Status: AC
  Administered 2015-07-18: 4 mg via INTRAVENOUS
  Filled 2015-07-18: qty 2

## 2015-07-18 MED ORDER — PROCHLORPERAZINE EDISYLATE 5 MG/ML IJ SOLN: 10.0000 mg | Freq: Once | INTRAMUSCULAR | Status: DC

## 2015-07-18 MED ORDER — SODIUM CHLORIDE 0.9 % IV BOLUS (SEPSIS)
1000.0000 mL | Freq: Once | INTRAVENOUS | Status: AC
  Administered 2015-07-18: 1000 mL via INTRAVENOUS

## 2015-07-18 MED ORDER — CALCIUM CARBONATE ANTACID 500 MG PO CHEW
2.0000 | CHEWABLE_TABLET | Freq: Once | ORAL | Status: AC
  Administered 2015-07-18: 400 mg via ORAL
  Filled 2015-07-18: qty 2

## 2015-07-18 MED ORDER — ACETAMINOPHEN 325 MG PO TABS
650.0000 mg | ORAL_TABLET | Freq: Once | ORAL | Status: AC | PRN
  Administered 2015-07-18: 650 mg via ORAL
  Filled 2015-07-18: qty 2

## 2015-07-18 NOTE — ED Notes (Signed)
Pt c/o headache and vomiting x 2 days. Pt states he was seen yesterday for same. Pt also reports nausea, diarrhea and chills.

## 2015-07-18 NOTE — ED Provider Notes (Signed)
CSN: [id number]     Arrival date & time 07/18/15  1152 History   First MD Initiated Contact with Patient 07/18/15 1623     Chief Complaint  Patient presents with  . Headache  . Emesis     (Consider location/radiation/quality/duration/timing/severity/associated sxs/prior Treatment) HPI Comments: 35yo M w/ PMH including GERD and diverticulitis who p/w vomiting, diarrhea, fevers, malaise. 2 days ago, the patient began feeling unwell. He presented here late last night with vomiting, diarrhea, generalized body aches, and head tingling. He was diagnosed with a viral syndrome after workup was reassuring and discharged home. He presents again because the symptoms have persisted and he states he cannot go to work. He reports ongoing vomiting throughout last night and continued diarrhea. He has had fevers and shaking chills. He denies any abdominal pain. He continues to have fatigue and generalized body aches. When asked about his headache, he states that it is a tingling sensation over his head starting in the back of his head and moving forward. No visual changes or extremity weakness. No neck stiffness. No cough or sore throat. No recent travel, sick contacts, rash, or tick bites.  Patient is a 35 y.o. male presenting with headaches and vomiting. The history is provided by the patient.  Headache Associated symptoms: vomiting   Emesis Associated symptoms: headaches     Past Medical History  Diagnosis Date  . Family history of adverse reaction to anesthesia     "dad had allergic reaction to anesthesia"   . Anginal pain (HCC) 07/13/2014    "mild"  . GERD (gastroesophageal reflux disease)   . Diverticulitis    Past Surgical History  Procedure Laterality Date  . Cystectomy Left ~ 2014    "wrist"  . Cystectomy Left ~ 2014    "2 on my foot""  . Cardiac catheterization N/A 07/13/2014    Procedure: Left Heart Cath and Coronary Angiography;  Surgeon: Marykay Lexavid W Harding, MD;  [facility] LAB;   Service: Cardiovascular;  Laterality: N/A;   History reviewed. No pertinent family history. Social History  Substance Use Topics  . Smoking status: Current Every Day Smoker -- 0.50 packs/day for 0 years    Types: Cigarettes  . Smokeless tobacco: Never Used  . Alcohol Use: No    Review of Systems  Gastrointestinal: Positive for vomiting.  Neurological: Positive for headaches.   10 Systems reviewed and are negative for acute change except as noted in the HPI.   Allergies  Penicillins  Home Medications   Prior to Admission medications   Medication Sig Start Date End Date Taking? Authorizing Provider  pantoprazole (PROTONIX) 40 MG tablet Take 1 tablet (40 mg total) by mouth daily. Patient taking differently: Take 40 mg by mouth daily as needed (for heartburn/acid reflux.).  07/14/14  Yes Azalee CourseHao Meng, PA   BP 115/84 mmHg  Pulse 88  Temp(Src) 99.1 F (37.3 C) (Oral)  Resp 18  SpO2 98% Physical Exam  Constitutional: He is oriented to person, place, and time. He appears well-developed and well-nourished.  Shivering, uncomfortable but non-toxic  HENT:  Head: Normocephalic and atraumatic.  Mouth/Throat: Oropharynx is clear and moist.  Moist mucous membranes  Eyes: Conjunctivae are normal. Pupils are equal, round, and reactive to light.  Neck: Normal range of motion. Neck supple.  Cardiovascular: Normal rate, regular rhythm and normal heart sounds.   No murmur heard. Pulmonary/Chest: Effort normal and breath sounds normal.  Abdominal: Soft. Bowel sounds are normal. He exhibits no distension. There is  no tenderness.  Musculoskeletal: He exhibits no edema.  Neurological: He is alert and oriented to person, place, and time.  Fluent speech  Skin: Skin is warm and dry. No rash noted.  Psychiatric: He has a normal mood and affect. Judgment normal.  Nursing note and vitals reviewed.   ED Course  Procedures (including critical care time) Labs Review Labs Reviewed  COMPREHENSIVE  METABOLIC PANEL - Abnormal; Notable for the following:    AST 58 (*)    ALT 88 (*)    All other components within normal limits  LIPASE, BLOOD  CBC WITH DIFFERENTIAL/PLATELET  I-STAT CG4 LACTIC ACID, ED    Imaging Review Dg Chest 2 View  07/18/2015  CLINICAL DATA:  Mid chest pain, shortness of breath, fever, and chills for 1 day. Headache and fatigue. Dizzy spells. EXAM: CHEST  2 VIEW COMPARISON:  10/05/2014 FINDINGS: The heart size and mediastinal contours are within normal limits. Both lungs are clear. The visualized skeletal structures are unremarkable. IMPRESSION: No active cardiopulmonary disease. Electronically Signed   By: Burman Nieves M.D.   On: 07/18/2015 00:47   I have personally reviewed and evaluated these lab results as part of my medical decision-making.   EKG Interpretation None     Medications  acetaminophen (TYLENOL) tablet 650 mg (650 mg Oral Given 07/18/15 1210)  sodium chloride 0.9 % bolus 1,000 mL (0 mLs Intravenous Stopped 07/18/15 1930)  ondansetron (ZOFRAN) injection 4 mg (4 mg Intravenous Given 07/18/15 1713)  ketorolac (TORADOL) 30 MG/ML injection 30 mg (30 mg Intravenous Given 07/18/15 1839)  calcium carbonate (TUMS - dosed in mg elemental calcium) chewable tablet 400 mg of elemental calcium (400 mg of elemental calcium Oral Given 07/18/15 1944)    MDM   Final diagnoses:  Fever, unspecified fever cause  Vomiting and diarrhea  Myalgia    Patient presents with 2 days of vomiting, diarrhea, headache and head tingling. He was evaluated yesterday and diagnosed with viral illness. He returns today because his symptoms have not improved. He was febrile at presentation at 101.8 but the remainder of his vital signs were normal. No focal abdominal tenderness on exam and no meningismus, neck supple. No rash. He received Tylenol in triage and on my exam was afebrile. Obtained a repeat lab work which was notable only for mild elevation of LFTs, CBC normal. Lactate  normal. Chest x-ray from yesterday and UA were unremarkable. Gave IV fluid bolus, Zofran, Toradol and later Tums for heartburn. The patient was able to eat a sandwich and drink ginger ale with no vomiting. On reexamination he appeared comfortable. Given the vomiting and diarrhea in association with the fever, I feel that tick borne illness is unlikely and the patient denies any outdoor exposure or tick bites. No confusion, severe headache, neck stiffness, or other neurologic symptoms to suggest meningitis. Discussed supportive care and reviewed return precautions. Patient discharged in satisfactory condition.   Laurence Spates, MD 07/19/15 [phone]

## 2015-07-18 NOTE — ED Notes (Signed)
Pt informed need urine specimen.  Pt verbalized understanding.

## 2015-07-18 NOTE — ED Notes (Signed)
Pt refused benadryl & compazine stating "they will just put me to sleep.  The doc said she would give me a work note for a few days."  Dr. Clarene DukeLittle informed of pt stating "I have eaten anything in 2 days."  Per Dr. Clarene DukeLittle pt may have sandwich.  [place] sandwich & ginger ale provided.

## 2015-07-18 NOTE — ED Notes (Signed)
Fluids given to patient and he is able to tolerate them without any issues

## 2015-07-18 NOTE — Discharge Instructions (Signed)
Drink plenty of fluids. Take acetaminophen or ibuprofen as needed for fever or aching.   General Headache Without Cause A headache is pain or discomfort felt around the head or neck area. The specific cause of a headache may not be found. There are many causes and types of headaches. A few common ones are:  Tension headaches.  Migraine headaches.  Cluster headaches.  Chronic daily headaches. HOME CARE INSTRUCTIONS  Watch your condition for any changes. Take these steps to help with your condition: Managing Pain  Take over-the-counter and prescription medicines only as told by your health care provider.  Lie down in a dark, quiet room when you have a headache.  If directed, apply ice to the head and neck area:  Put ice in a plastic bag.  Place a towel between your skin and the bag.  Leave the ice on for 20 minutes, 2-3 times per day.  Use a heating pad or hot shower to apply heat to the head and neck area as told by your health care provider.  Keep lights dim if bright lights bother you or make your headaches worse. Eating and Drinking  Eat meals on a regular schedule.  Limit alcohol use.  Decrease the amount of caffeine you drink, or stop drinking caffeine. General Instructions  Keep all follow-up visits as told by your health care provider. This is important.  Keep a headache journal to help find out what may trigger your headaches. For example, write down:  What you eat and drink.  How much sleep you get.  Any change to your diet or medicines.  Try massage or other relaxation techniques.  Limit stress.  Sit up straight, and do not tense your muscles.  Do not use tobacco products, including cigarettes, chewing tobacco, or e-cigarettes. If you need help quitting, ask your health care provider.  Exercise regularly as told by your health care provider.  Sleep on a regular schedule. Get 7-9 hours of sleep, or the amount recommended by your health care  provider. SEEK MEDICAL CARE IF:   Your symptoms are not helped by medicine.  You have a headache that is different from the usual headache.  You have nausea or you vomit.  You have a fever. SEEK IMMEDIATE MEDICAL CARE IF:   Your headache becomes severe.  You have repeated vomiting.  You have a stiff neck.  You have a loss of vision.  You have problems with speech.  You have pain in the eye or ear.  You have muscular weakness or loss of muscle control.  You lose your balance or have trouble walking.  You feel faint or pass out.  You have confusion.   This information is not intended to replace advice given to you by your health care provider. Make sure you discuss any questions you have with your health care provider.   Document Released: 12/25/2004 Document Revised: 09/15/2014 Document Reviewed: 04/19/2014 Elsevier Interactive Patient Education [employer]2016 [employer]

## 2015-08-13 ENCOUNTER — Emergency Department (HOSPITAL_COMMUNITY): Payer: Self-pay

## 2015-08-13 ENCOUNTER — Encounter (HOSPITAL_COMMUNITY): Payer: Self-pay | Admitting: Emergency Medicine

## 2015-08-13 ENCOUNTER — Emergency Department (HOSPITAL_COMMUNITY)
Admission: EM | Admit: 2015-08-13 | Discharge: 2015-08-13 | Disposition: A | Discharge status: Home / Self Care | Payer: Self-pay | Attending: Emergency Medicine | Admitting: Emergency Medicine

## 2015-08-13 DIAGNOSIS — R072 Precordial pain: Secondary | ICD-10-CM | POA: Insufficient documentation

## 2015-08-13 DIAGNOSIS — F17210 Nicotine dependence, cigarettes, uncomplicated: Secondary | ICD-10-CM | POA: Insufficient documentation

## 2015-08-13 DIAGNOSIS — R079 Chest pain, unspecified: Secondary | ICD-10-CM

## 2015-08-13 HISTORY — DX: Nonrheumatic mitral (valve) insufficiency: I34.0

## 2015-08-13 LAB — BASIC METABOLIC PANEL
Anion gap: 5 (ref 5–15)
BUN: 16 mg/dL (ref 6–20)
CHLORIDE: 107 mmol/L (ref 101–111)
CO2: 27 mmol/L (ref 22–32)
CREATININE: 1.01 mg/dL (ref 0.61–1.24)
Calcium: 8.9 mg/dL (ref 8.9–10.3)
GFR calc non Af Amer: >60 mL/min (ref >60)
Glucose, Bld: 120 mg/dL — ABNORMAL HIGH (ref 65–99)
Potassium: 3.7 mmol/L (ref 3.5–5.1)
Sodium: 139 mmol/L (ref 135–145)

## 2015-08-13 LAB — CBC
HEMATOCRIT: 38.9 % — AB (ref 39.0–52.0)
Hemoglobin: 13.4 g/dL (ref 13.0–17.0)
MCH: 30.5 pg (ref 26.0–34.0)
MCHC: 34.4 g/dL (ref 30.0–36.0)
MCV: 88.6 fL (ref 78.0–100.0)
PLATELETS: 239 10*3/uL (ref 150–400)
RBC: 4.39 MIL/uL (ref 4.22–5.81)
RDW: 13.2 % (ref 11.5–15.5)
WBC: 5.6 10*3/uL (ref 4.0–10.5)

## 2015-08-13 LAB — I-STAT TROPONIN, ED: Troponin i, poc: 0 ng/mL (ref 0.00–0.08)

## 2015-08-13 LAB — TROPONIN I: Troponin I: <0.03 ng/mL (ref <0.03)

## 2015-08-13 IMAGING — CR DG CHEST 2V
2 series · 2 of 2 positions shown · non-contrast
Comparison: Chest radiograph [DATE]

CLINICAL DATA: Chest pain for 1 week, worsening today. Status post
cardiac catheterization [IQ].

EXAM:
CHEST  2 VIEW

[w chest pa]
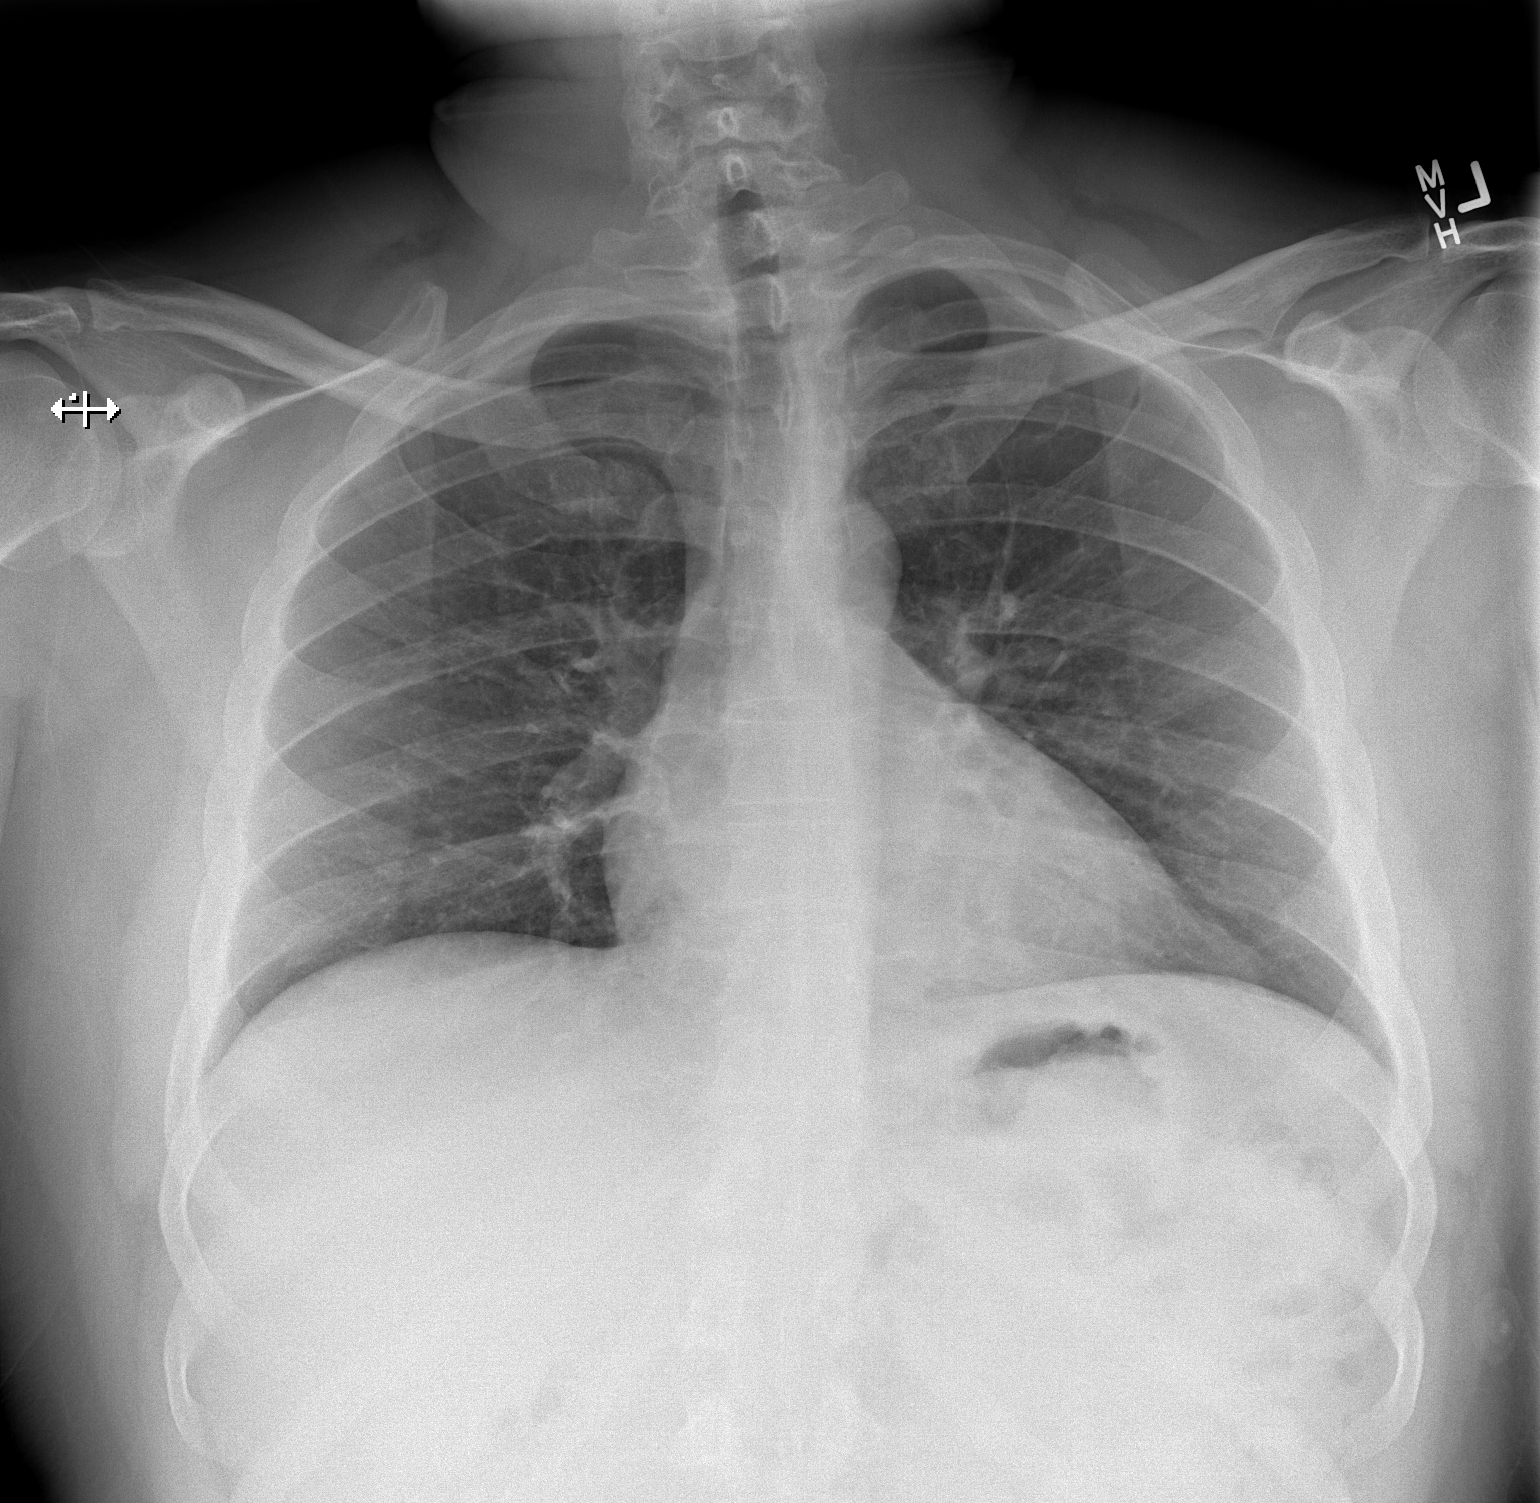

[w chest lat]
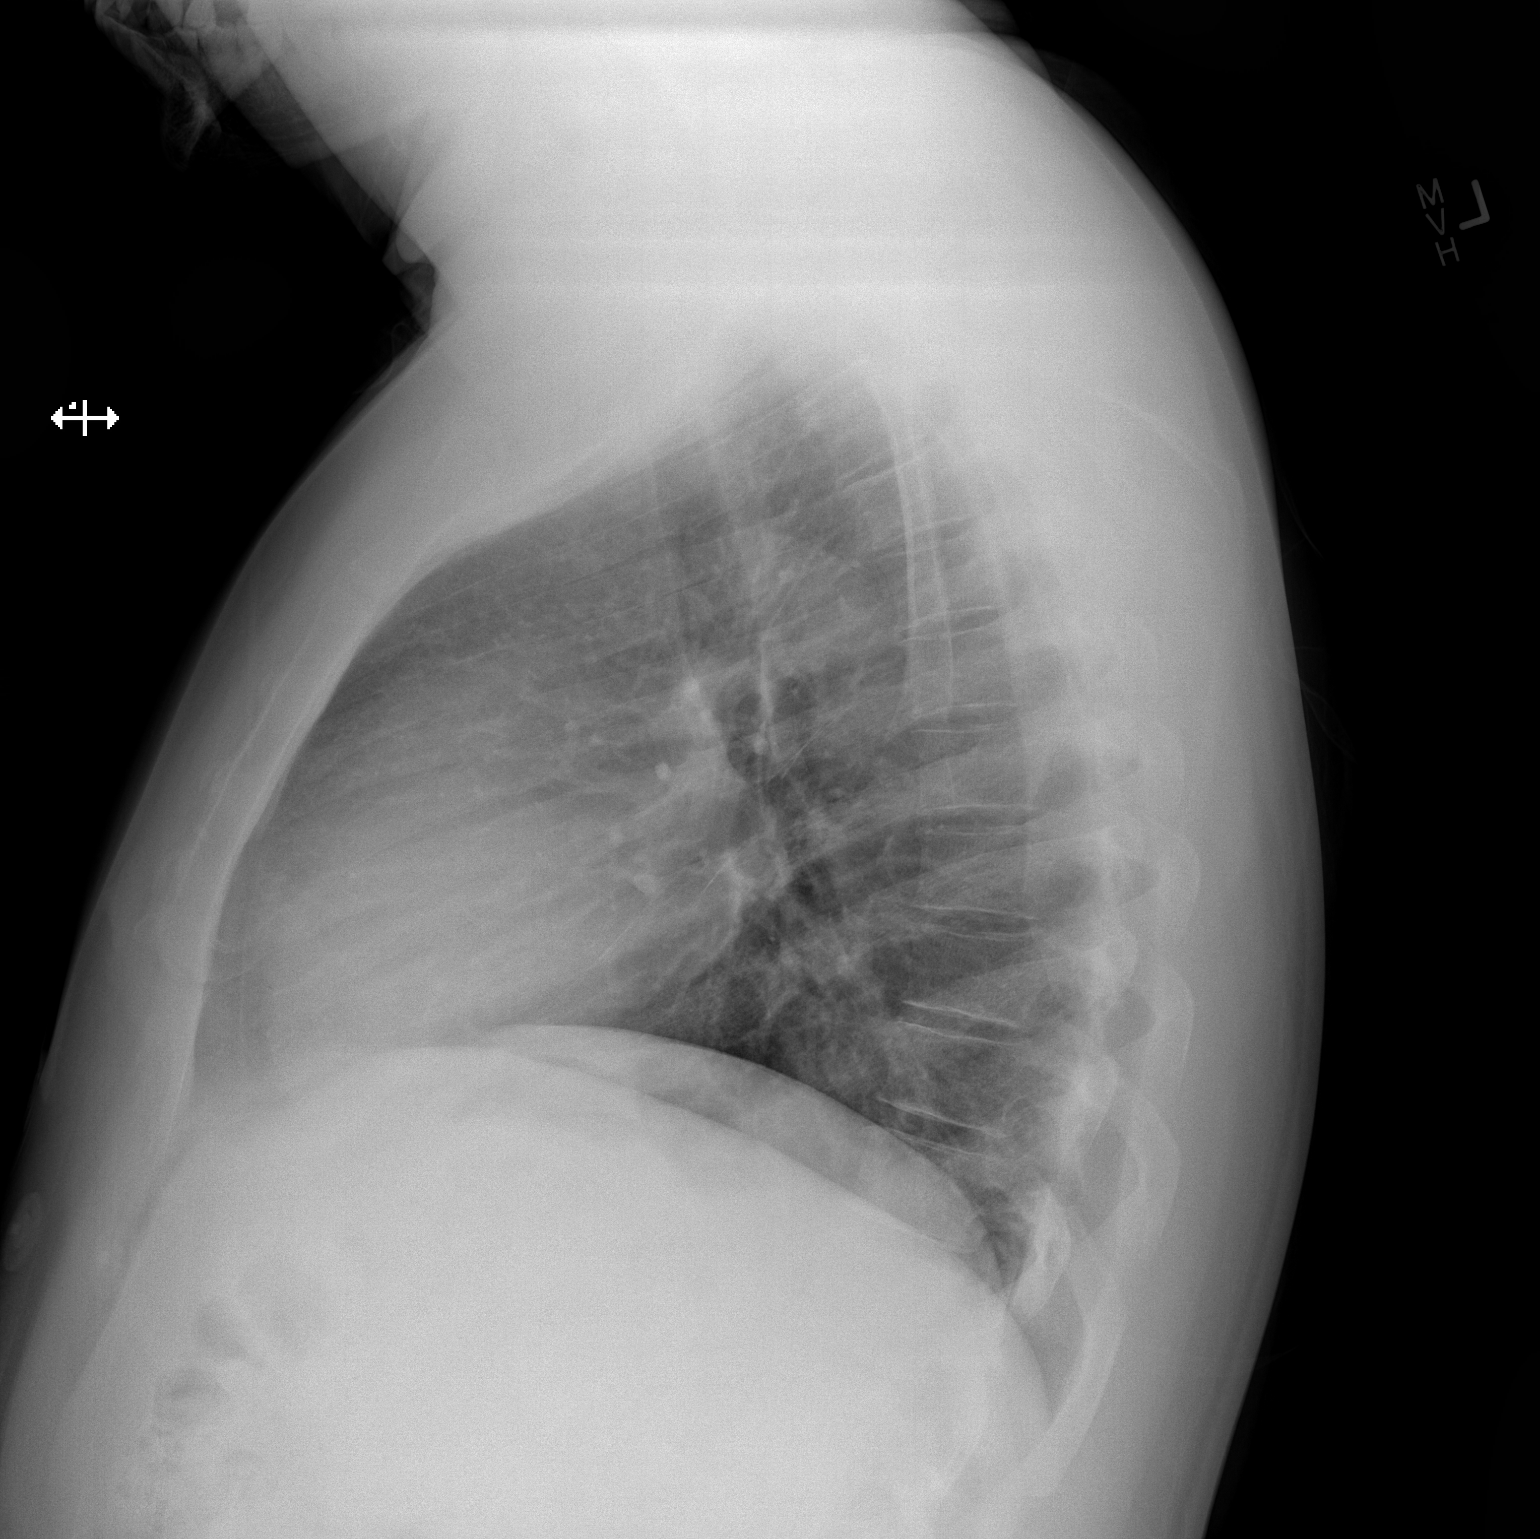

[2 of 2 positions shown; findings below may reference images not displayed]

FINDINGS: Cardiomediastinal silhouette is normal. The lungs are clear without
pleural effusions or focal consolidations. Trachea projects midline
and there is no pneumothorax. Soft tissue planes and included
osseous structures are non-suspicious.
IMPRESSION: Normal chest.

## 2015-08-13 NOTE — ED Triage Notes (Signed)
Pt here with complaints of central chest pain that radiates to his back. Pt smokes and has a hx of MI in 2016. Pt states that he feels dizzy and feels nausea but denies emesis

## 2015-08-13 NOTE — ED Provider Notes (Signed)
[facility]-EMERGENCY DEPT Provider Note   CSN: [id number] Arrival date & time: 08/13/15  0032  By signing my name below, I, Alyssa Grove, attest that this documentation has been prepared under the direction and in the presence of Gilda Crease, MD. Electronically Signed: Alyssa Grove, ED Scribe. 08/13/15. 1:26 AM.   First MD Initiated Contact with Patient 08/13/15 0122    History   Chief Complaint Chief Complaint  Patient presents with  . Chest Pain   The history is provided by the patient. No language interpreter was used.    HPI Comments: Kyle Silva is a 35 y.o. male with PMHx of MI in 2016 who presents to the Emergency Department complaining of gradual onset, intermittent, central chest pain onset a few days. Pt states occasionally the pain radiates to his middle back. Pt states he has sensation of tingling in his back and left arm. He states his pain is brought on when he has a physically demanding day at work. Pt denies having a stent placed after previous MI. Pt is a current smoker. Pt reports associated dizziness and nausea. Pt denies vomiting.  Past Medical History:  Diagnosis Date  . Anginal pain (HCC) 07/13/2014   "mild"  . Diverticulitis   . Family history of adverse reaction to anesthesia    "dad had allergic reaction to anesthesia"   . GERD (gastroesophageal reflux disease)   . MI (mitral incompetence)     Patient Active Problem List   Diagnosis Date Noted  . Current smoker 07/13/2014  . Family history of premature CAD 07/13/2014  . Obese 07/13/2014  . Unstable angina (HCC) 07/13/2014  . Chest pain     Past Surgical History:  Procedure Laterality Date  . CARDIAC CATHETERIZATION N/A 07/13/2014   Procedure: Left Heart Cath and Coronary Angiography;  Surgeon: Marykay Lex, MD;  Location: [facility] INVASIVE CV LAB;  Service: Cardiovascular;  Laterality: N/A;  . CYSTECTOMY Left ~ 2014   "wrist"  . CYSTECTOMY Left ~ 2014   "2 on my foot""     Home Medications     Prior to Admission medications   Medication Sig Start Date End Date Taking? Authorizing Provider  pantoprazole (PROTONIX) 40 MG tablet Take 1 tablet (40 mg total) by mouth daily. Patient taking differently: Take 40 mg by mouth daily as needed (for heartburn/acid reflux.).  07/14/14  Yes Azalee Course, PA    Family History No family history on file.  Social History Social History  Substance Use Topics  . Smoking status: Current Every Day Smoker    Packs/day: 0.50    Years: 0.00    Types: Cigarettes  . Smokeless tobacco: Never Used  . Alcohol use No     Allergies   Penicillins   Review of Systems Review of Systems  Constitutional: Negative for fever.  Cardiovascular: Positive for chest pain.  Gastrointestinal: Positive for nausea. Negative for vomiting.  Neurological: Positive for dizziness.       + Tingling sensation in back and left arm  All other systems reviewed and are negative.  Physical Exam Updated Vital Signs BP 98/61 Comment: Pt states his basline HR is in 40-50 range.  +2 pedal pulses  Pulse (!) 45 Comment: Pt states his basline HR is in 40-50 range.  +2 pedal pulses  Temp 97.9 F (36.6 C) (Oral)   Resp 17 Comment: Pt states his basline HR is in 40-50 range.  +2 pedal pulses  Ht 5\' 10"  (1.778 m)   Wt 250 lb (  113.4 kg)   SpO2 100% Comment: Pt states his basline HR is in 40-50 range.  +2 pedal pulses  BMI 35.87 kg/m   Physical Exam  Constitutional: He is oriented to person, place, and time. He appears well-developed and well-nourished. No distress.  HENT:  Head: Normocephalic and atraumatic.  Right Ear: Hearing normal.  Left Ear: Hearing normal.  Nose: Nose normal.  Mouth/Throat: Oropharynx is clear and moist and mucous membranes are normal.  Eyes: Conjunctivae and EOM are normal. Pupils are equal, round, and reactive to light.  Neck: Normal range of motion. Neck supple.  Cardiovascular: Regular rhythm, S1 normal and S2 normal.  Bradycardia present.  Exam  reveals no gallop and no friction rub.   No murmur heard. Pulmonary/Chest: Effort normal and breath sounds normal. No respiratory distress. He exhibits no tenderness.  Abdominal: Soft. Normal appearance and bowel sounds are normal. There is no hepatosplenomegaly. There is no tenderness. There is no rebound, no guarding, no tenderness at McBurney's point and negative Murphy's sign. No hernia.  Musculoskeletal: Normal range of motion.  Neurological: He is alert and oriented to person, place, and time. He has normal strength. No cranial nerve deficit or sensory deficit. Coordination normal. GCS eye subscore is 4. GCS verbal subscore is 5. GCS motor subscore is 6.  Skin: Skin is warm, dry and intact. No rash noted. No cyanosis.  Psychiatric: He has a normal mood and affect. His speech is normal and behavior is normal. Thought content normal.  Nursing note and vitals reviewed.    ED Treatments / Results  DIAGNOSTIC STUDIES: Oxygen Saturation is 100% on RA, normal by my interpretation.    COORDINATION OF CARE: 1:24 AM Discussed treatment plan with pt at bedside which includes lab work, EKG, and DG Chest 2 View and pt agreed to plan.  Labs (all labs ordered are listed, but only abnormal results are displayed) Labs Reviewed  BASIC METABOLIC PANEL - Abnormal; Notable for the following:       Result Value   Glucose, Bld 120 (*)    All other components within normal limits  CBC - Abnormal; Notable for the following:    HCT 38.9 (*)    All other components within normal limits  TROPONIN I  I-STAT TROPOININ, ED    EKG  EKG Interpretation None       Radiology Dg Chest 2 View  Result Date: 08/13/2015 CLINICAL DATA:  Chest pain for 1 week, worsening today. Status post cardiac catheterization 2016. EXAM: CHEST  2 VIEW COMPARISON:  Chest radiograph July 17, 2015 FINDINGS: Cardiomediastinal silhouette is normal. The lungs are clear without pleural effusions or focal consolidations. Trachea  projects midline and there is no pneumothorax. Soft tissue planes and included osseous structures are non-suspicious. IMPRESSION: Normal chest. Electronically Signed   By: Awilda Metro M.D.   On: 08/13/2015 01:15    Procedures Procedures (including critical care time)  Medications Ordered in ED Medications - No data to display   Initial Impression / Assessment and Plan / ED Course  I have reviewed the triage vital signs and the nursing notes.  Pertinent labs & imaging results that were available during my care of the patient were reviewed by me and considered in my medical decision making (see chart for details).  Clinical Course    Patient presents to the emergency department with complaints of chest pain. Patient reported that he had a heart attack last year. Reviewing his records, however, reveals that he was admitted for  a single elevated troponin and had cardiac catheterization performed and did not show any evidence of coronary artery disease. Based on this catheterization last year, patient is felt to have very low likelihood of coronary artery disease causing his symptoms currently. EKG does not show any evidence of ischemia or infarct. He is bradycardic, however, reviewing her records this is baseline for him. Patient has had 2 troponins here in the ER that are negative. Patient is felt to be safe for discharge and outpatient follow-up.  Final Clinical Impressions(s) / ED Diagnoses   Final diagnoses:  Chest pain, unspecified chest pain type    New Prescriptions New Prescriptions   No medications on file   I personally performed the services described in this documentation, which was scribed in my presence. The recorded information has been reviewed and is accurate.     Gilda Crease, MD 08/13/15 [phone]

## 2015-08-30 ENCOUNTER — Emergency Department (HOSPITAL_COMMUNITY): Payer: 59

## 2015-08-30 ENCOUNTER — Encounter (HOSPITAL_COMMUNITY): Payer: Self-pay

## 2015-08-30 ENCOUNTER — Observation Stay (HOSPITAL_COMMUNITY)
Admission: EM | Admit: 2015-08-30 | Discharge: 2015-08-31 | Disposition: A | Discharge status: Home / Self Care | Payer: 59 | Attending: Internal Medicine | Admitting: Internal Medicine

## 2015-08-30 DIAGNOSIS — I252 Old myocardial infarction: Secondary | ICD-10-CM | POA: Insufficient documentation

## 2015-08-30 DIAGNOSIS — F17210 Nicotine dependence, cigarettes, uncomplicated: Secondary | ICD-10-CM | POA: Insufficient documentation

## 2015-08-30 DIAGNOSIS — K5732 Diverticulitis of large intestine without perforation or abscess without bleeding: Principal | ICD-10-CM | POA: Insufficient documentation

## 2015-08-30 DIAGNOSIS — R109 Unspecified abdominal pain: Secondary | ICD-10-CM | POA: Diagnosis present

## 2015-08-30 DIAGNOSIS — Z720 Tobacco use: Secondary | ICD-10-CM | POA: Diagnosis not present

## 2015-08-30 DIAGNOSIS — K219 Gastro-esophageal reflux disease without esophagitis: Secondary | ICD-10-CM | POA: Insufficient documentation

## 2015-08-30 DIAGNOSIS — F17200 Nicotine dependence, unspecified, uncomplicated: Secondary | ICD-10-CM | POA: Diagnosis present

## 2015-08-30 DIAGNOSIS — K5792 Diverticulitis of intestine, part unspecified, without perforation or abscess without bleeding: Secondary | ICD-10-CM | POA: Diagnosis present

## 2015-08-30 HISTORY — DX: Diverticulitis of intestine, part unspecified, without perforation or abscess without bleeding: K57.92

## 2015-08-30 LAB — COMPREHENSIVE METABOLIC PANEL
ALK PHOS: 60 U/L (ref 38–126)
ALT: 43 U/L (ref 17–63)
AST: 25 U/L (ref 15–41)
Albumin: 4.1 g/dL (ref 3.5–5.0)
Anion gap: 5 (ref 5–15)
BILIRUBIN TOTAL: 0.5 mg/dL (ref 0.3–1.2)
BUN: 8 mg/dL (ref 6–20)
CALCIUM: 9.1 mg/dL (ref 8.9–10.3)
CHLORIDE: 106 mmol/L (ref 101–111)
CO2: 27 mmol/L (ref 22–32)
CREATININE: 0.9 mg/dL (ref 0.61–1.24)
Glucose, Bld: 159 mg/dL — ABNORMAL HIGH (ref 65–99)
Potassium: 3.5 mmol/L (ref 3.5–5.1)
Sodium: 138 mmol/L (ref 135–145)
Total Protein: 7.4 g/dL (ref 6.5–8.1)

## 2015-08-30 LAB — LIPASE, BLOOD: LIPASE: 22 U/L (ref 11–51)

## 2015-08-30 LAB — CBC
HCT: 43.5 % (ref 39.0–52.0)
Hemoglobin: 14.8 g/dL (ref 13.0–17.0)
MCH: 30.5 pg (ref 26.0–34.0)
MCHC: 34 g/dL (ref 30.0–36.0)
MCV: 89.7 fL (ref 78.0–100.0)
PLATELETS: 275 10*3/uL (ref 150–400)
RBC: 4.85 MIL/uL (ref 4.22–5.81)
RDW: 13.5 % (ref 11.5–15.5)
WBC: 8.9 10*3/uL (ref 4.0–10.5)

## 2015-08-30 LAB — URINALYSIS, ROUTINE W REFLEX MICROSCOPIC
Bilirubin Urine: NEGATIVE
GLUCOSE, UA: NEGATIVE mg/dL
HGB URINE DIPSTICK: NEGATIVE
KETONES UR: NEGATIVE mg/dL
LEUKOCYTES UA: NEGATIVE
Nitrite: NEGATIVE
PROTEIN: NEGATIVE mg/dL
Specific Gravity, Urine: 1.027 (ref 1.005–1.030)
pH: 6 (ref 5.0–8.0)

## 2015-08-30 IMAGING — CT CT ABD-PELV W/ CM
2 of 4 series · 15 of 46 positions shown, 17 images · IV contrast (iopamidol)
Comparison: None.

CLINICAL DATA: Right lower quadrant pain and nausea for 1 day,
diarrhea

EXAM:
CT ABDOMEN AND PELVIS WITH CONTRAST
TECHNIQUE: Multidetector CT imaging of the abdomen and pelvis was performed
using the standard protocol following bolus administration of
intravenous contrast.
CONTRAST:  100mL [2Y] IOPAMIDOL ([2Y]) INJECTION 61%

[Series 2: abd/pel with · axial · 0.77mm/px · z∈[-504,-74]mm · 12 of 98 slices shown, 14 images]
[im 6/98  soft-tissue]
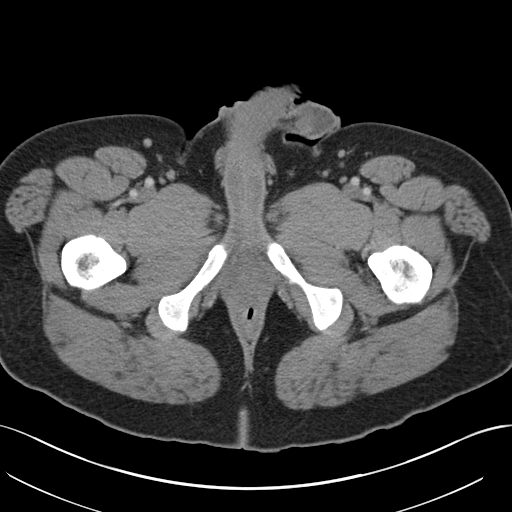
[im 6/98  bone]
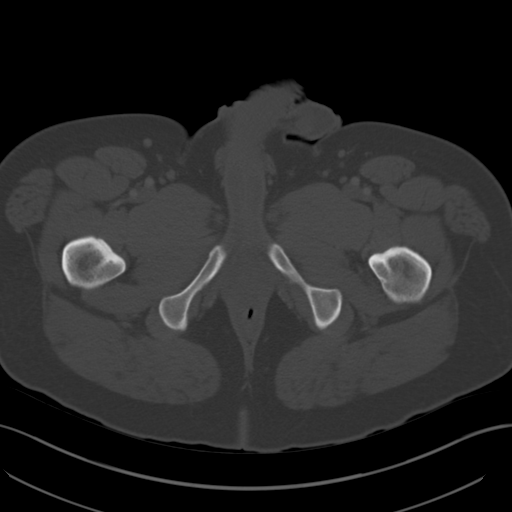
[im 17/98  soft-tissue]
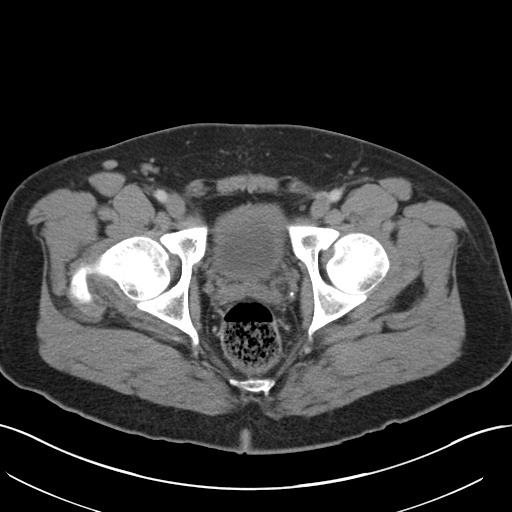
[im 22/98  soft-tissue]
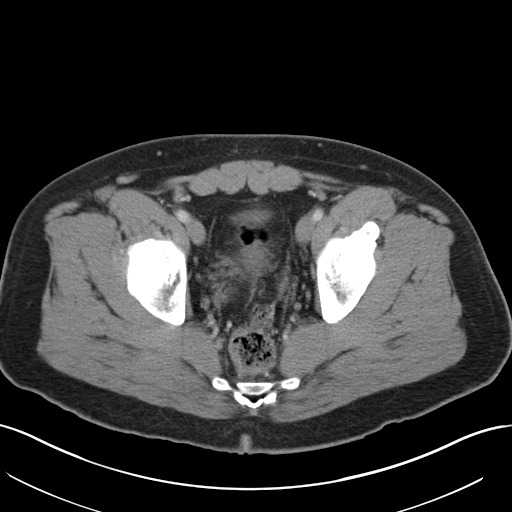
[im 27/98  soft-tissue]
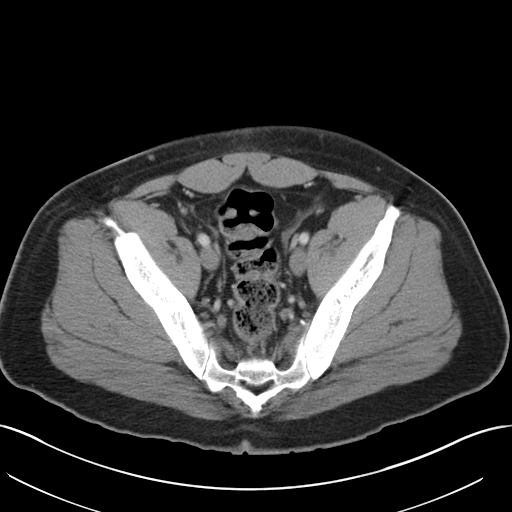
[im 38/98  soft-tissue]
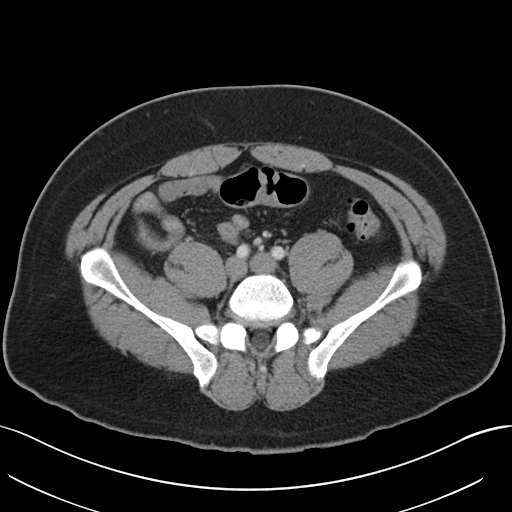
[im 44/98  soft-tissue]
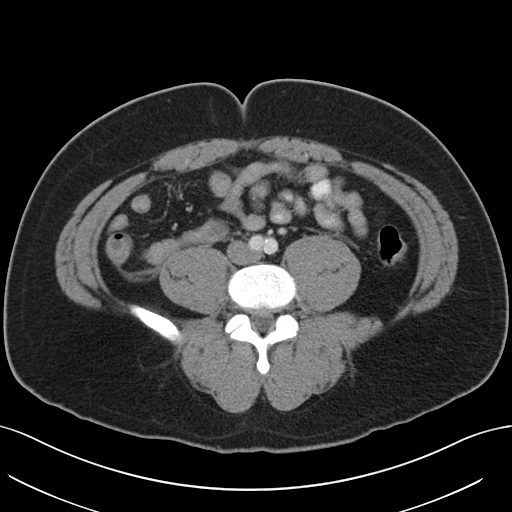
[im 54/98  soft-tissue]
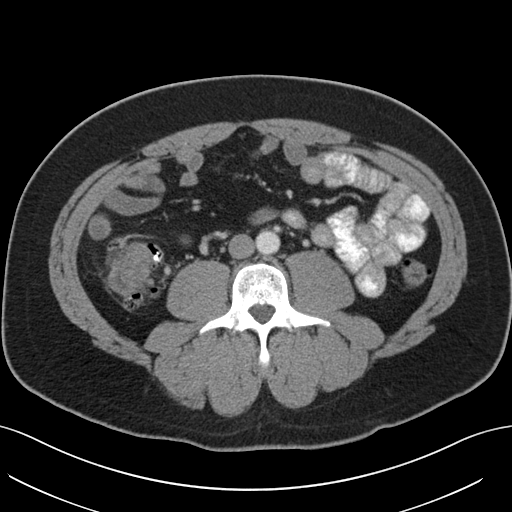
[im 60/98  soft-tissue]
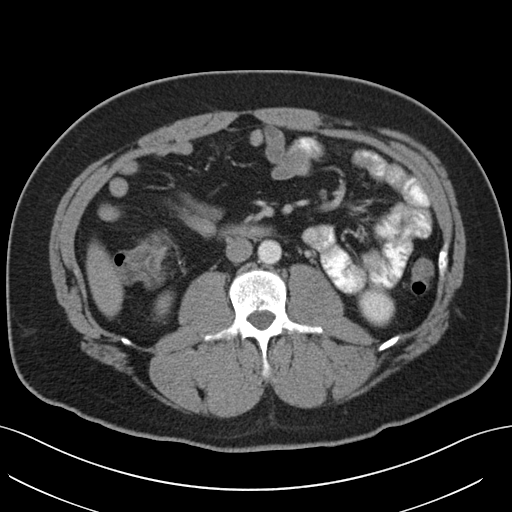
[im 71/98  soft-tissue]
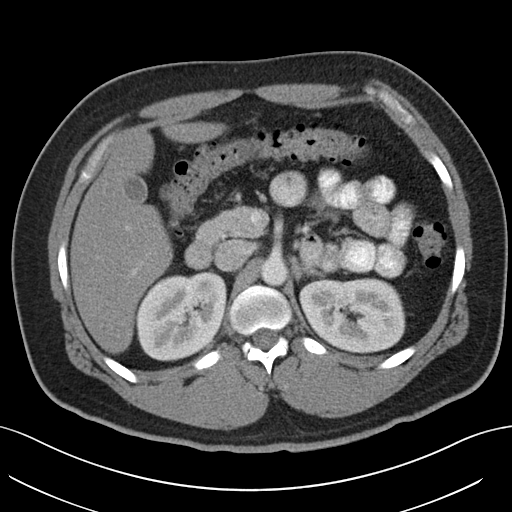
[im 71/98  bone]
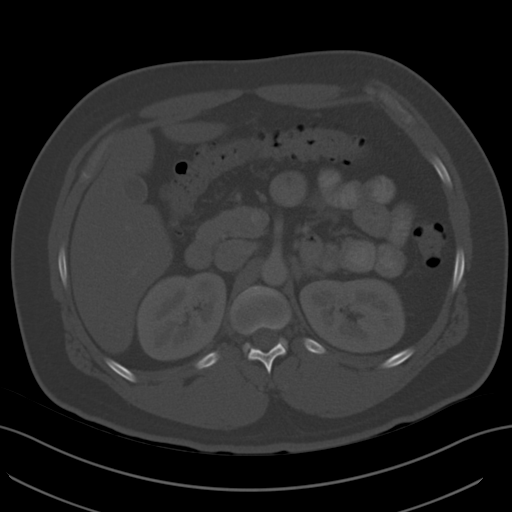
[im 76/98  soft-tissue]
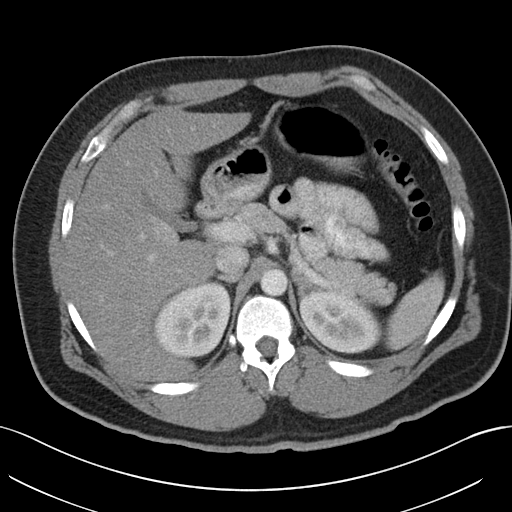
[im 81/98  soft-tissue]
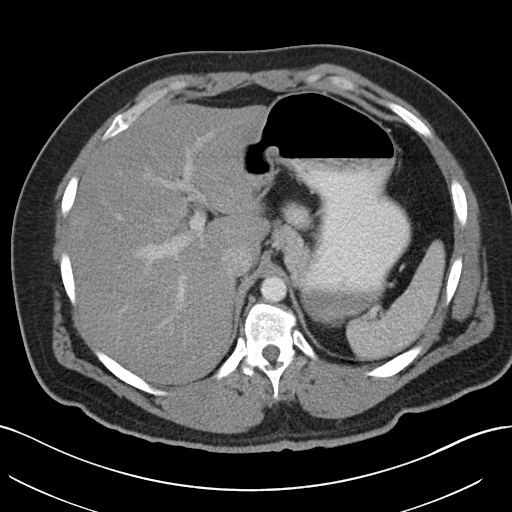
[im 92/98  soft-tissue]
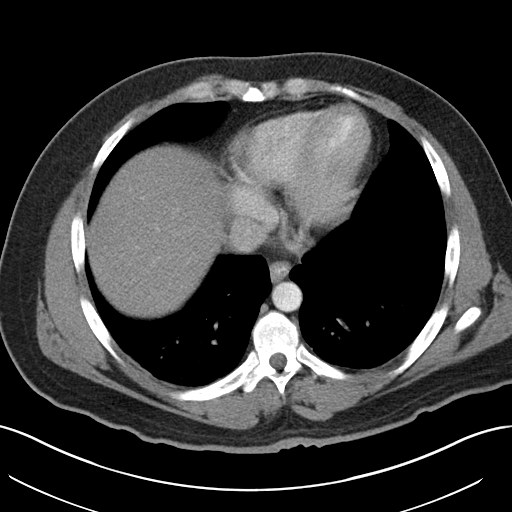

[Series 5: coronal a/|p · coronal · 0.76mm/px · 3 of 154 slices shown]
[im 52/154  soft-tissue]
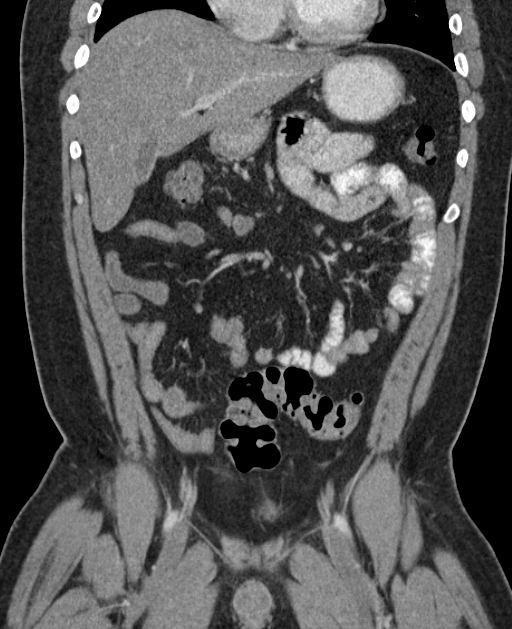
[im 69/154  soft-tissue]
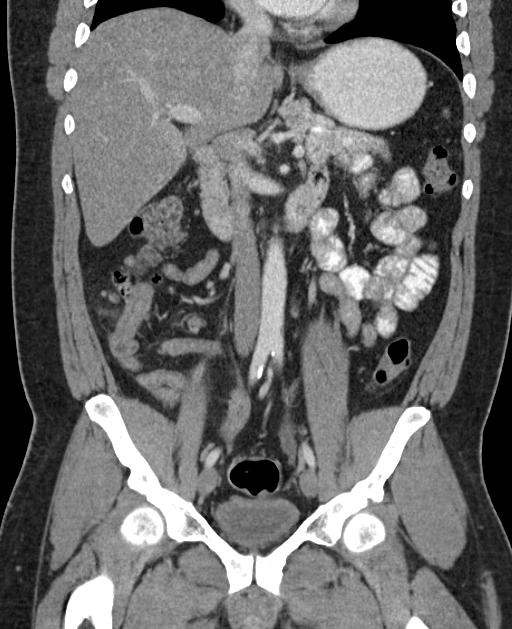
[im 86/154  soft-tissue]
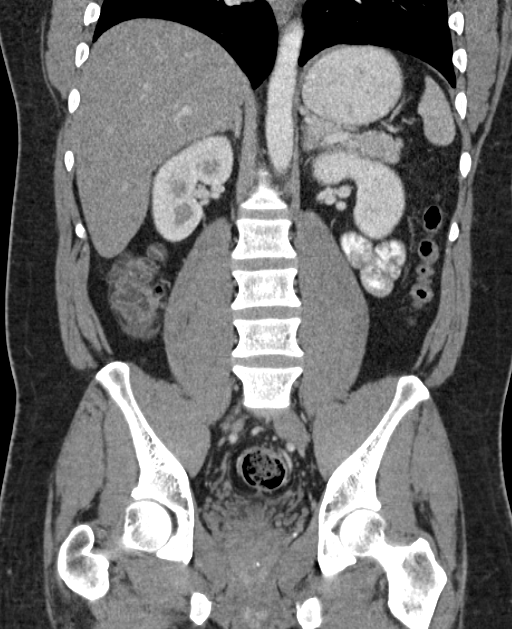

[15 of 46 positions shown; findings below may reference images not displayed]

FINDINGS: Lung bases are unremarkable.

Mild fatty infiltration of the liver.  Gallbladder is contracted.

Enhanced pancreas is unremarkable.

Enhanced spleen is unremarkable.

Tiny hiatal hernia.

No adrenal gland mass is noted. Enhanced kidneys are symmetrical in
size. No hydronephrosis or hydroureter. The urinary bladder is under
distended. Nonspecific thickening of urinary bladder wall.

Oral contrast material was given to the patient. No gastric outlet
obstruction. No small bowel obstruction. The terminal ileum is
unremarkable.

Colonic diverticula are noted right colon. Axial image 49 there is
mild thickening and enhancement of diverticular wall just lateral to
the cecum. This is confirmed in coronal image 79. There is small
stranding of pericolonic fat. Tiny amount of fluid/stranding is
noted just inferior to the cecum. Findings are highly suspicious for
acute diverticulitis. Normal appendix is partially visualized in
axial image 52 just above the terminal ileum. Few diverticula are
noted descending colon. Moderate stool noted in distal sigmoid colon
and rectum. The prostate and seminal vesicles are unremarkable.

There is small loculated simple fluid collection in left pelvis just
medial to external iliac artery measures 2.8 by 1 cm probable small
loculated ascites. No peripheral enhancement to suggest an abscess.

Sagittal images of the spine shows no destructive bony lesions.
Degenerative changes lower thoracic spine.

There is no evidence of free abdominal air. No inguinal adenopathy.
Small nonspecific bilateral inguinal lymph nodes are noted.
IMPRESSION: 1. Colonic diverticula are noted right colon. Axial image 49 there
is mild thickening and enhancement of diverticular wall just lateral
to the cecum. This is confirmed in coronal image 79. There is small
stranding of pericolonic fat. Tiny amount of fluid/stranding is
noted just inferior to the cecum. Findings are highly suspicious for
acute diverticulitis. Normal appendix is partially visualized in
axial image 52 just above the terminal ileum.
2. No small bowel obstruction.
3. Fatty infiltration of the liver.
4. No hydronephrosis or hydroureter.
5. Small loculated fluid in left anterior pelvis measures 2.8 x 1
cm. No evidence of peripheral enhancement to suggest an abscess.
This may represent loculated ascites or a small seroma.

## 2015-08-30 MED ORDER — SODIUM CHLORIDE 0.9% FLUSH
3.0000 mL | Freq: Two times a day (BID) | INTRAVENOUS | Status: DC
  Administered 2015-08-30 – 2015-08-31 (×2): 3 mL via INTRAVENOUS

## 2015-08-30 MED ORDER — SODIUM CHLORIDE 0.9 % IV BOLUS (SEPSIS)
1000.0000 mL | Freq: Once | INTRAVENOUS | Status: AC
  Administered 2015-08-30: 1000 mL via INTRAVENOUS

## 2015-08-30 MED ORDER — SODIUM CHLORIDE 0.9 % IV BOLUS (SEPSIS)
500.0000 mL | Freq: Once | INTRAVENOUS | Status: AC
  Administered 2015-08-30: 09:00:00 via INTRAVENOUS

## 2015-08-30 MED ORDER — HYDROCODONE-ACETAMINOPHEN 5-325 MG PO TABS
1.0000 | ORAL_TABLET | ORAL | Status: DC | PRN
  Administered 2015-08-30 – 2015-08-31 (×2): 2 via ORAL
  Filled 2015-08-30 (×2): qty 2

## 2015-08-30 MED ORDER — METRONIDAZOLE IN NACL 5-0.79 MG/ML-% IV SOLN
500.0000 mg | Freq: Three times a day (TID) | INTRAVENOUS | Status: DC
  Administered 2015-08-30 – 2015-08-31 (×2): 500 mg via INTRAVENOUS
  Filled 2015-08-30 (×2): qty 100

## 2015-08-30 MED ORDER — ONDANSETRON HCL 4 MG/2ML IJ SOLN
4.0000 mg | Freq: Four times a day (QID) | INTRAMUSCULAR | Status: DC | PRN
  Administered 2015-08-31: 4 mg via INTRAVENOUS
  Filled 2015-08-30: qty 2

## 2015-08-30 MED ORDER — PANTOPRAZOLE SODIUM 40 MG PO TBEC
40.0000 mg | DELAYED_RELEASE_TABLET | Freq: Every day | ORAL | Status: DC
  Administered 2015-08-30 – 2015-08-31 (×2): 40 mg via ORAL
  Filled 2015-08-30 (×2): qty 1

## 2015-08-30 MED ORDER — CIPROFLOXACIN IN D5W 400 MG/200ML IV SOLN
400.0000 mg | Freq: Two times a day (BID) | INTRAVENOUS | Status: DC
  Administered 2015-08-31: 400 mg via INTRAVENOUS
  Filled 2015-08-30: qty 200

## 2015-08-30 MED ORDER — KETOROLAC TROMETHAMINE 30 MG/ML IJ SOLN
30.0000 mg | Freq: Four times a day (QID) | INTRAMUSCULAR | Status: DC | PRN
  Administered 2015-08-30 – 2015-08-31 (×2): 30 mg via INTRAVENOUS
  Filled 2015-08-30 (×2): qty 1

## 2015-08-30 MED ORDER — ONDANSETRON HCL 4 MG PO TABS: 4.0000 mg | ORAL_TABLET | Freq: Four times a day (QID) | ORAL | Status: DC | PRN

## 2015-08-30 MED ORDER — PROMETHAZINE HCL 25 MG PO TABS: 25.0000 mg | ORAL_TABLET | Freq: Four times a day (QID) | ORAL | 0 refills | Status: DC | PRN

## 2015-08-30 MED ORDER — NICOTINE 14 MG/24HR TD PT24
14.0000 mg | MEDICATED_PATCH | Freq: Every day | TRANSDERMAL | Status: DC
  Administered 2015-08-31: 14 mg via TRANSDERMAL
  Filled 2015-08-30: qty 1

## 2015-08-30 MED ORDER — DIATRIZOATE MEGLUMINE & SODIUM 66-10 % PO SOLN
15.0000 mL | Freq: Once | ORAL | Status: AC
  Administered 2015-08-30: 15 mL via ORAL

## 2015-08-30 MED ORDER — SODIUM CHLORIDE 0.9% FLUSH: 3.0000 mL | INTRAVENOUS | Status: DC | PRN

## 2015-08-30 MED ORDER — CIPROFLOXACIN HCL 500 MG PO TABS: 500.0000 mg | ORAL_TABLET | Freq: Two times a day (BID) | ORAL | 0 refills | Status: DC

## 2015-08-30 MED ORDER — ONDANSETRON HCL 4 MG/2ML IJ SOLN
4.0000 mg | Freq: Once | INTRAMUSCULAR | Status: AC
  Administered 2015-08-30: 4 mg via INTRAVENOUS
  Filled 2015-08-30: qty 2

## 2015-08-30 MED ORDER — HYDROMORPHONE HCL 1 MG/ML IJ SOLN
1.0000 mg | Freq: Once | INTRAMUSCULAR | Status: AC
  Administered 2015-08-30: 1 mg via INTRAVENOUS
  Filled 2015-08-30: qty 1

## 2015-08-30 MED ORDER — METRONIDAZOLE IN NACL 5-0.79 MG/ML-% IV SOLN
500.0000 mg | Freq: Once | INTRAVENOUS | Status: AC
  Administered 2015-08-30: 500 mg via INTRAVENOUS
  Filled 2015-08-30: qty 100

## 2015-08-30 MED ORDER — POLYETHYLENE GLYCOL 3350 17 G PO PACK: 17.0000 g | PACK | Freq: Every day | ORAL | 0 refills | Status: DC

## 2015-08-30 MED ORDER — METRONIDAZOLE 500 MG PO TABS: 500.0000 mg | ORAL_TABLET | Freq: Three times a day (TID) | ORAL | 0 refills | Status: DC

## 2015-08-30 MED ORDER — CIPROFLOXACIN IN D5W 400 MG/200ML IV SOLN
400.0000 mg | Freq: Once | INTRAVENOUS | Status: AC
  Administered 2015-08-30: 400 mg via INTRAVENOUS
  Filled 2015-08-30: qty 200

## 2015-08-30 MED ORDER — ENOXAPARIN SODIUM 60 MG/0.6ML ~~LOC~~ SOLN
0.5000 mg/kg | SUBCUTANEOUS | Status: DC
  Administered 2015-08-30: 55 mg via SUBCUTANEOUS
  Filled 2015-08-30: qty 0.6

## 2015-08-30 MED ORDER — SODIUM CHLORIDE 0.9 % IV SOLN: 250.0000 mL | INTRAVENOUS | Status: DC | PRN

## 2015-08-30 MED ORDER — IOPAMIDOL (ISOVUE-300) INJECTION 61%
100.0000 mL | Freq: Once | INTRAVENOUS | Status: AC | PRN
  Administered 2015-08-30: 100 mL via INTRAVENOUS

## 2015-08-30 NOTE — H&P (Signed)
History and Physical    Kyle Silva ZOX:[id number] DOB: 1980-11-01 DOA: 08/30/2015  PCP: No PCP Per Patient None Patient coming from: Home  Chief Complaint: Nausea, vomiting and diarrhea  HPI: Kyle Silva is a 35 y.o. male with past medical history of acute diverticulitis 4 years ago that comes to the ED for abdominal pain nausea vomiting and diarrhea that started the day prior to admission. He denies any hematemesis or bloody diarrhea. He relates that the day prior to admission he was nauseated and anorexic and it progressively got worse to the point where he started vomiting everything that he put in his stomach. This morning the pain got so severe that he decided to come to the ED. He denies any fever, chest pain or shortness of breath.  ED Course: He was given several doses of IV Dilaudid 2 L of IV fluid and IV antibiotics with no improvement.  Review of Systems: As per HPI otherwise 10 point review of systems negative.    Past Medical History:  Diagnosis Date  . Anginal pain (HCC) 07/13/2014   "mild"  . Diverticulitis   . Family history of adverse reaction to anesthesia    "dad had allergic reaction to anesthesia"   . GERD (gastroesophageal reflux disease)   . MI (mitral incompetence)     Past Surgical History:  Procedure Laterality Date  . CARDIAC CATHETERIZATION N/A 07/13/2014   Procedure: Left Heart Cath and Coronary Angiography;  Surgeon: Marykay Lex, MD;  Location: [facility] INVASIVE CV LAB;  Service: Cardiovascular;  Laterality: N/A;  . CYSTECTOMY Left ~ 2014   "wrist"  . CYSTECTOMY Left ~ 2014   "2 on my foot""     reports that he has been smoking Cigarettes.  He has been smoking about 0.50 packs per day for the past 0.00 years. He has never used smokeless tobacco. He reports that he does not drink alcohol or use drugs.  Allergies  Allergen Reactions  . Penicillins Hives    Has patient had a PCN reaction causing immediate rash, facial/tongue/throat swelling, SOB or  lightheadedness with hypotension:Yes Has patient had a PCN reaction causing severe rash involving mucus membranes or skin necrosis:unsure Has patient had a PCN reaction that required hospitalization:Yes Has patient had a PCN reaction occurring within the last 10 years:Yes If all of the above answers are "NO", then may proceed with Cephalosporin use.      Family History  Problem Relation Age of Onset  . AAA (abdominal aortic aneurysm) Mother   . Heart failure Father      Prior to Admission medications   Medication Sig Start Date End Date Taking? Authorizing Provider  omeprazole (PRILOSEC) 20 MG capsule Take 20 mg by mouth daily as needed (heartburn/acid reflux).   Yes Historical Provider, MD  ciprofloxacin (CIPRO) 500 MG tablet Take 1 tablet (500 mg total) by mouth 2 (two) times daily. 08/30/15   Benjiman Core, MD  metroNIDAZOLE (FLAGYL) 500 MG tablet Take 1 tablet (500 mg total) by mouth 3 (three) times daily. 08/30/15   Benjiman Core, MD  pantoprazole (PROTONIX) 40 MG tablet Take 1 tablet (40 mg total) by mouth daily. Patient not taking: Reported on 08/30/2015 07/14/14   Azalee Course, PA  polyethylene glycol [facility]) packet Take 17 g by mouth daily. 08/30/15   Benjiman Core, MD  promethazine (PHENERGAN) 25 MG tablet Take 1 tablet (25 mg total) by mouth every 6 (six) hours as needed for nausea or vomiting. 08/30/15   Benjiman Core, MD  Physical Exam: Vitals:   08/30/15 0812 08/30/15 1203 08/30/15 1315  BP: 129/63 108/63 131/75  Pulse: 91 61   Resp: 18 17   Temp: 99 F (37.2 C)  98.6 F (37 C)  TempSrc: Oral  Oral  SpO2: 100% 100% 100%  Weight: 113.4 kg (250 lb)    Height: 5\' 10"  (1.778 m)        Constitutional: NAD, calm, comfortable Vitals:   08/30/15 0812 08/30/15 1203 08/30/15 1315  BP: 129/63 108/63 131/75  Pulse: 91 61   Resp: 18 17   Temp: 99 F (37.2 C)  98.6 F (37 C)  TempSrc: Oral  Oral  SpO2: 100% 100% 100%  Weight: 113.4 kg (250 lb)    Height: 5'  10" (1.778 m)     Eyes: PERRL, lids and conjunctivae normal ENMT: Mucous membranes are moist. Posterior pharynx clear of any exudate or lesions.Normal dentition.  Neck: normal, supple, no masses, no thyromegaly Respiratory: clear to auscultation bilaterally, no wheezing, no crackles. Normal respiratory effort. No accessory muscle use.  Cardiovascular: Regular rate and rhythm, no murmurs / rubs / gallops. No extremity edema. 2+ pedal pulses. No carotid bruits.  Abdomen: Soft with tenderness in the right upper quadrant and lower quadrant some guarding and exquisite tenderness  Musculoskeletal: no clubbing / cyanosis. No joint deformity upper and lower extremities. Good ROM, no contractures. Normal muscle tone.  Skin: no rashes, lesions, ulcers. No induration Neurologic: CN 2-12 grossly intact. Sensation intact, DTR normal. Strength 5/5 in all 4.  Psychiatric: Normal judgment and insight. Alert and oriented x 3. Normal mood.   Labs on Admission: I have personally reviewed following labs and imaging studies  CBC:  Recent Labs Lab 08/30/15 0815  WBC 8.9  HGB 14.8  HCT 43.5  MCV 89.7  PLT 275   Basic Metabolic Panel:  Recent Labs Lab 08/30/15 0815  NA 138  K 3.5  CL 106  CO2 27  GLUCOSE 159*  BUN 8  CREATININE 0.90  CALCIUM 9.1   GFR: Estimated Creatinine Clearance: 143.2 mL/min (by C-G formula based on SCr of 0.9 mg/dL). Liver Function Tests:  Recent Labs Lab 08/30/15 0815  AST 25  ALT 43  ALKPHOS 60  BILITOT 0.5  PROT 7.4  ALBUMIN 4.1    Recent Labs Lab 08/30/15 0815  LIPASE 22   No results for input(s): AMMONIA in the last 168 hours. Coagulation Profile: No results for input(s): INR, PROTIME in the last 168 hours. Cardiac Enzymes: No results for input(s): CKTOTAL, CKMB, CKMBINDEX, TROPONINI in the last 168 hours. BNP (last 3 results) No results for input(s): PROBNP in the last 8760 hours. HbA1C: No results for input(s): HGBA1C in the last 72  hours. CBG: No results for input(s): GLUCAP in the last 168 hours. Lipid Profile: No results for input(s): CHOL, HDL, LDLCALC, TRIG, CHOLHDL, LDLDIRECT in the last 72 hours. Thyroid Function Tests: No results for input(s): TSH, T4TOTAL, FREET4, T3FREE, THYROIDAB in the last 72 hours. Anemia Panel: No results for input(s): VITAMINB12, FOLATE, FERRITIN, TIBC, IRON, RETICCTPCT in the last 72 hours. Urine analysis:    Component Value Date/Time   COLORURINE YELLOW 08/30/2015 0815   APPEARANCEUR CLEAR 08/30/2015 0815   LABSPEC 1.027 08/30/2015 0815   PHURINE 6.0 08/30/2015 0815   GLUCOSEU NEGATIVE 08/30/2015 0815   HGBUR NEGATIVE 08/30/2015 0815   BILIRUBINUR NEGATIVE 08/30/2015 0815   KETONESUR NEGATIVE 08/30/2015 0815   PROTEINUR NEGATIVE 08/30/2015 0815   NITRITE NEGATIVE 08/30/2015 0815   LEUKOCYTESUR  NEGATIVE 08/30/2015 0815   Sepsis Labs: !!!!!!!!!!!!!!!!!!!!!!!!!!!!!!!!!!!!!!!!!!!! @LABRCNTIP (procalcitonin:4,lacticidven:4) )No results found for this or any previous visit (from the past 240 hour(s)).   Radiological Exams on Admission: Ct Abdomen Pelvis W Contrast  Result Date: 08/30/2015 CLINICAL DATA:  Right lower quadrant pain and nausea for 1 day, diarrhea EXAM: CT ABDOMEN AND PELVIS WITH CONTRAST TECHNIQUE: Multidetector CT imaging of the abdomen and pelvis was performed using the standard protocol following bolus administration of intravenous contrast. CONTRAST:  100mL ISOVUE-300 IOPAMIDOL (ISOVUE-300) INJECTION 61% COMPARISON:  None. FINDINGS: Lung bases are unremarkable. Mild fatty infiltration of the liver.  Gallbladder is contracted. Enhanced pancreas is unremarkable. Enhanced spleen is unremarkable. Tiny hiatal hernia. No adrenal gland mass is noted. Enhanced kidneys are symmetrical in size. No hydronephrosis or hydroureter. The urinary bladder is under distended. Nonspecific thickening of urinary bladder wall. Oral contrast material was given to the patient. No gastric  outlet obstruction. No small bowel obstruction. The terminal ileum is unremarkable. Colonic diverticula are noted right colon. Axial image 49 there is mild thickening and enhancement of diverticular wall just lateral to the cecum. This is confirmed in coronal image 79. There is small stranding of pericolonic fat. Tiny amount of fluid/stranding is noted just inferior to the cecum. Findings are highly suspicious for acute diverticulitis. Normal appendix is partially visualized in axial image 52 just above the terminal ileum. Few diverticula are noted descending colon. Moderate stool noted in distal sigmoid colon and rectum. The prostate and seminal vesicles are unremarkable. There is small loculated simple fluid collection in left pelvis just medial to external iliac artery measures 2.8 by 1 cm probable small loculated ascites. No peripheral enhancement to suggest an abscess. Sagittal images of the spine shows no destructive bony lesions. Degenerative changes lower thoracic spine. There is no evidence of free abdominal air. No inguinal adenopathy. Small nonspecific bilateral inguinal lymph nodes are noted. IMPRESSION: 1. Colonic diverticula are noted right colon. Axial image 49 there is mild thickening and enhancement of diverticular wall just lateral to the cecum. This is confirmed in coronal image 79. There is small stranding of pericolonic fat. Tiny amount of fluid/stranding is noted just inferior to the cecum. Findings are highly suspicious for acute diverticulitis. Normal appendix is partially visualized in axial image 52 just above the terminal ileum. 2. No small bowel obstruction. 3. Fatty infiltration of the liver. 4. No hydronephrosis or hydroureter. 5. Small loculated fluid in left anterior pelvis measures 2.8 x 1 cm. No evidence of peripheral enhancement to suggest an abscess. This may represent loculated ascites or a small seroma. Electronically Signed   By: Natasha MeadLiviu  Pop M.D.   On: 08/30/2015 10:47     EKG: Independently reviewednone  Assessment/Plan Acute diverticulitis: Start him on IV antibiotics ciprofloxacin and Flagyl, will continue oral narcotics and IV ketorolac for pain. The patient would like to try at least a liquid diet. He is not septic, and has no leukocytosis or fever, so we'll have to manage his symptoms by pain mainly. I have explained to him that we would try to minimize IV narcotics.  Current smoker: Counseling will place a nicotine patch.    DVT prophylaxis: lovenox Code Status: full Family Communication: none Disposition Plan: home in 3 day Consults called: none Admission status: inaptient telemetry   Marinda ElkFELIZ ORTIZ, Aubriee Szeto MD [facility] Pager [phone]  If 7PM-7AM, please contact night-coverage [email or web address] Password [email or web address]  08/30/2015, 2:20 PM

## 2015-08-30 NOTE — ED Notes (Signed)
Patient transported to CT 

## 2015-08-30 NOTE — ED Notes (Addendum)
Pt states he has low HR however has never told PCP or had formal work up. EDP Pickering made aware and Admitting MD paged to update. Pt is without complaint. EDP Pickering did not give any orders.

## 2015-08-30 NOTE — ED Provider Notes (Signed)
[facility]-EMERGENCY DEPT Provider Note   CSN: [id number] Arrival date & time: 08/30/15  0755     History   Chief Complaint Chief Complaint  Patient presents with  . Abdominal Pain  . Emesis  . Diarrhea    HPI Kyle Silva is a 35 y.o. male.  The history is provided by the patient.  Abdominal Pain   This is a new problem. Associated symptoms include diarrhea and vomiting. Pertinent negatives include fever and dysuria.  Emesis   Associated symptoms include abdominal pain, chills and diarrhea. Pertinent negatives include no fever.  Diarrhea   Associated symptoms include abdominal pain, vomiting and chills.   Patient presents with abdominal pain. Began yesterday. It is moderate in his right abdomen. She's also had nausea vomiting diarrhea. No fevers. He's had decreased appetite. Previous history of diverticulitis. States this feels somewhat like that. States he's had some chills but no fever. He had mild brief pain relief with pain pill that he had. Movement makes the pain worse. He has a decreased appetite. Past Medical History:  Diagnosis Date  . Anginal pain (HCC) 07/13/2014   "mild"  . Diverticulitis   . Family history of adverse reaction to anesthesia    "dad had allergic reaction to anesthesia"   . GERD (gastroesophageal reflux disease)   . MI (mitral incompetence)     Patient Active Problem List   Diagnosis Date Noted  . Current smoker 07/13/2014  . Family history of premature CAD 07/13/2014  . Obese 07/13/2014  . Unstable angina (HCC) 07/13/2014  . Chest pain     Past Surgical History:  Procedure Laterality Date  . CARDIAC CATHETERIZATION N/A 07/13/2014   Procedure: Left Heart Cath and Coronary Angiography;  Surgeon: Marykay Lexavid W Harding, MD;  [facility];  Service: Cardiovascular;  Laterality: N/A;  . CYSTECTOMY Left ~ 2014   "wrist"  . CYSTECTOMY Left ~ 2014   "2 on my foot""       Home Medications    Prior to Admission medications   Medication  Sig Start Date End Date Taking? Authorizing Provider  omeprazole (PRILOSEC) 20 MG capsule Take 20 mg by mouth daily as needed (heartburn/acid reflux).   Yes Historical Provider, MD  ciprofloxacin (CIPRO) 500 MG tablet Take 1 tablet (500 mg total) by mouth 2 (two) times daily. 08/30/15   Benjiman CoreNathan Midas Daughety, MD  metroNIDAZOLE (FLAGYL) 500 MG tablet Take 1 tablet (500 mg total) by mouth 3 (three) times daily. 08/30/15   Benjiman CoreNathan Austin Pongratz, MD  pantoprazole (PROTONIX) 40 MG tablet Take 1 tablet (40 mg total) by mouth daily. Patient not taking: Reported on 08/30/2015 07/14/14   Azalee CourseHao Meng, PA  polyethylene glycol [facility](MIRALAX) packet Take 17 g by mouth daily. 08/30/15   Benjiman CoreNathan Naylah Cork, MD  promethazine (PHENERGAN) 25 MG tablet Take 1 tablet (25 mg total) by mouth every 6 (six) hours as needed for nausea or vomiting. 08/30/15   Benjiman CoreNathan Shahmeer Bunn, MD    Family History History reviewed. No pertinent family history.  Social History Social History  Substance Use Topics  . Smoking status: Current Every Day Smoker    Packs/day: 0.25    Years: 0.00    Types: Cigarettes  . Smokeless tobacco: Never Used  . Alcohol use No     Allergies   Penicillins   Review of Systems Review of Systems  Constitutional: Positive for appetite change and chills. Negative for fever.  HENT: Negative for sore throat.   Respiratory: Negative for shortness of breath.  Cardiovascular: Negative for chest pain.  Gastrointestinal: Positive for abdominal pain, diarrhea and vomiting.  Genitourinary: Negative for difficulty urinating, dysuria and testicular pain.  Musculoskeletal: Negative for gait problem.  Neurological: Negative for weakness and light-headedness.  Hematological: Negative for adenopathy.  Psychiatric/Behavioral: Negative for confusion.     Physical Exam Updated Vital Signs BP 108/63 (BP Location: Right Arm)   Pulse 61   Temp 99 F (37.2 C) (Oral)   Resp 17   Ht 5\' 10"  (1.778 m)   Wt 250 lb (113.4 kg)   SpO2  100%   BMI 35.87 kg/m   Physical Exam  Constitutional: He appears well-developed.  Patient appears uncomfortable  HENT:  Head: Normocephalic.  Eyes: EOM are normal.  Cardiovascular: Normal rate.   Pulmonary/Chest: Effort normal.  Abdominal: Soft. There is tenderness. No hernia.  Moderate tenderness on right side of abdomen and suprapubic area.  Musculoskeletal: Normal range of motion.  Neurological: He is alert.  Skin: Skin is warm.  Psychiatric: He has a normal mood and affect.     ED Treatments / Results  Labs (all labs ordered are listed, but only abnormal results are displayed) Labs Reviewed  COMPREHENSIVE METABOLIC PANEL - Abnormal; Notable for the following:       Result Value   Glucose, Bld 159 (*)    All other components within normal limits  LIPASE, BLOOD  CBC  URINALYSIS, ROUTINE W REFLEX MICROSCOPIC (NOT AT [facility])    EKG  EKG Interpretation None       Radiology Ct Abdomen Pelvis W Contrast  Result Date: 08/30/2015 CLINICAL DATA:  Right lower quadrant pain and nausea for 1 day, diarrhea EXAM: CT ABDOMEN AND PELVIS WITH CONTRAST TECHNIQUE: Multidetector CT imaging of the abdomen and pelvis was performed using the standard protocol following bolus administration of intravenous contrast. CONTRAST:  ISOVUE-300 IOPAMIDOL (ISOVUE-300) INJECTION 61% COMPARISON:  None. FINDINGS: Lung bases are unremarkable. Mild fatty infiltration of the liver.  Gallbladder is contracted. Enhanced pancreas is unremarkable. Enhanced spleen is unremarkable. Tiny hiatal hernia. No adrenal gland mass is noted. Enhanced kidneys are symmetrical in size. No hydronephrosis or hydroureter. The urinary bladder is under distended. Nonspecific thickening of urinary bladder wall. Oral contrast material was given to the patient. No gastric outlet obstruction. No small bowel obstruction. The terminal ileum is unremarkable. Colonic diverticula are noted right colon. Axial image 49 there is mild  thickening and enhancement of diverticular wall just lateral to the cecum. This is confirmed in coronal image 79. There is small stranding of pericolonic fat. Tiny amount of fluid/stranding is noted just inferior to the cecum. Findings are highly suspicious for acute diverticulitis. Normal appendix is partially visualized in axial image 52 just above the terminal ileum. Few diverticula are noted descending colon. Moderate stool noted in distal sigmoid colon and rectum. The prostate and seminal vesicles are unremarkable. There is small loculated simple fluid collection in left pelvis just medial to external iliac artery measures 2.8 by 1 cm probable small loculated ascites. No peripheral enhancement to suggest an abscess. Sagittal images of the spine shows no destructive bony lesions. Degenerative changes lower thoracic spine. There is no evidence of free abdominal air. No inguinal adenopathy. Small nonspecific bilateral inguinal lymph nodes are noted. IMPRESSION: 1. Colonic diverticula are noted right colon. Axial image 49 there is mild thickening and enhancement of diverticular wall just lateral to the cecum. This is confirmed in coronal image 79. There is small stranding of pericolonic fat. Tiny amount of fluid/stranding  is noted just inferior to the cecum. Findings are highly suspicious for acute diverticulitis. Normal appendix is partially visualized in axial image 52 just above the terminal ileum. 2. No small bowel obstruction. 3. Fatty infiltration of the liver. 4. No hydronephrosis or hydroureter. 5. Small loculated fluid in left anterior pelvis measures 2.8 x 1 cm. No evidence of peripheral enhancement to suggest an abscess. This may represent loculated ascites or a small seroma. Electronically Signed   By: Natasha MeadLiviu  Pop M.D.   On: 08/30/2015 10:47    Procedures Procedures (including critical care time)  Medications Ordered in ED Medications  sodium chloride 0.9 % bolus 500 mL ( Intravenous New  Bag/Given 08/30/15 0849)  HYDROmorphone (DILAUDID) injection 1 mg (1 mg Intravenous Given 08/30/15 0849)  ondansetron (ZOFRAN) injection 4 mg (4 mg Intravenous Given 08/30/15 0849)  diatrizoate meglumine-sodium (GASTROGRAFIN) 66-10 % solution 15 mL (15 mLs Oral Given 08/30/15 0851)  iopamidol (ISOVUE-300) 61 % injection 100 mL (100 mLs Intravenous Contrast Given 08/30/15 1001)     Initial Impression / Assessment and Plan / ED Course  I have reviewed the triage vital signs and the nursing notes.  Pertinent labs & imaging results that were available during my care of the patient were reviewed by me and considered in my medical decision making (see chart for details).  Clinical Course    Patient with abdominal pain and diverticulitis. History of same. Continued pain. Not controlled orally. Admit to internal medicine.  Final Clinical Impressions(s) / ED Diagnoses   Final diagnoses:  Diverticulitis of large intestine without perforation or abscess without bleeding    New Prescriptions New Prescriptions   CIPROFLOXACIN (CIPRO) 500 MG TABLET    Take 1 tablet (500 mg total) by mouth 2 (two) times daily.   METRONIDAZOLE (FLAGYL) 500 MG TABLET    Take 1 tablet (500 mg total) by mouth 3 (three) times daily.   POLYETHYLENE GLYCOL (MIRALAX) PACKET    Take 17 g by mouth daily.   PROMETHAZINE (PHENERGAN) 25 MG TABLET    Take 1 tablet (25 mg total) by mouth every 6 (six) hours as needed for nausea or vomiting.     Benjiman CoreNathan Rindi Beechy, MD 08/30/15 [id number]

## 2015-08-30 NOTE — ED Notes (Signed)
DRINKING CT CONRAST

## 2015-08-30 NOTE — ED Triage Notes (Signed)
Pt c/o generalized abdominal pain and n/v/d x 1 day.  Pain score 9/10.  Pt reports taking pain medication w/ relief, but sts "it wore off quick."

## 2015-08-31 DIAGNOSIS — Z720 Tobacco use: Secondary | ICD-10-CM | POA: Diagnosis not present

## 2015-08-31 DIAGNOSIS — K5792 Diverticulitis of intestine, part unspecified, without perforation or abscess without bleeding: Secondary | ICD-10-CM | POA: Diagnosis not present

## 2015-08-31 LAB — BASIC METABOLIC PANEL
ANION GAP: 4 — AB (ref 5–15)
BUN: 7 mg/dL (ref 6–20)
CHLORIDE: 107 mmol/L (ref 101–111)
CO2: 27 mmol/L (ref 22–32)
Calcium: 8.7 mg/dL — ABNORMAL LOW (ref 8.9–10.3)
Creatinine, Ser: 0.82 mg/dL (ref 0.61–1.24)
GFR calc non Af Amer: >60 mL/min (ref >60)
Glucose, Bld: 83 mg/dL (ref 65–99)
POTASSIUM: 4.2 mmol/L (ref 3.5–5.1)
SODIUM: 138 mmol/L (ref 135–145)

## 2015-08-31 LAB — TROPONIN I: Troponin I: <0.03 ng/mL (ref <0.03)

## 2015-08-31 MED ORDER — TRAMADOL HCL 50 MG PO TABS: 25.0000 mg | ORAL_TABLET | Freq: Four times a day (QID) | ORAL | 0 refills | Status: DC | PRN

## 2015-08-31 MED ORDER — METRONIDAZOLE 500 MG PO TABS
500.0000 mg | ORAL_TABLET | Freq: Three times a day (TID) | ORAL | Status: DC
  Administered 2015-08-31: 500 mg via ORAL
  Filled 2015-08-31: qty 1

## 2015-08-31 MED ORDER — CIPROFLOXACIN HCL 500 MG PO TABS: 500.0000 mg | ORAL_TABLET | Freq: Two times a day (BID) | ORAL | 0 refills | Status: DC

## 2015-08-31 MED ORDER — CIPROFLOXACIN HCL 500 MG PO TABS
500.0000 mg | ORAL_TABLET | Freq: Two times a day (BID) | ORAL | Status: DC
  Administered 2015-08-31: 500 mg via ORAL
  Filled 2015-08-31: qty 1

## 2015-08-31 MED ORDER — GI COCKTAIL ~~LOC~~
30.0000 mL | Freq: Once | ORAL | Status: AC
  Administered 2015-08-31: 30 mL via ORAL
  Filled 2015-08-31: qty 30

## 2015-08-31 MED ORDER — METRONIDAZOLE 500 MG PO TABS: 500.0000 mg | ORAL_TABLET | Freq: Three times a day (TID) | ORAL | 0 refills | Status: DC

## 2015-08-31 NOTE — Progress Notes (Signed)
Pt began complaining of dull medial chest pain and a metallic taste. Night coverage notified. EKG completed. Troponin level ordered. Both came back negative. GI cocktail and Zofran given. Pt states chest pain has resolved. Will continue to monitor.

## 2015-08-31 NOTE — Progress Notes (Signed)
Discharge instructions reviewed with patient utilizing teach back method no questions at this. Patient discharged to home

## 2015-08-31 NOTE — Discharge Summary (Signed)
Physician Discharge Summary  Kyle Silva ZOX:[id number] DOB: 1980/01/27 DOA: 08/30/2015  PCP: No PCP Per Patient  Admit date: 08/30/2015 Discharge date: 08/31/2015  Admitted From: home Disposition:  home  Recommendations for Outpatient Follow-up:  1. Follow up with PCP in 1-2 weeks 2. Continue ciprofloxacin and metronidazole for 9 additional days for diverticulitis  Home Health: None Equipment/Devices: None  Discharge Condition: Stable CODE STATUS: Full code Diet recommendation: Regular  HPI: Kyle Silva is a 35 y.o. male with past medical history of acute diverticulitis 4 years ago that comes to the ED for abdominal pain nausea vomiting and diarrhea that started the day prior to admission. He denies any hematemesis or bloody diarrhea. He relates that the day prior to admission he was nauseated and anorexic and it progressively got worse to the point where he started vomiting everything that he put in his stomach. This morning the pain got so severe that he decided to come to the ED. He denies any fever, chest pain or shortness of breath.  Hospital Course: Discharge Diagnoses:  Active Problems:   Current smoker   Acute diverticulitis   Patient was admitted to the hospital with mild diverticulitis, he was placed initially on IV antibiotics ciprofloxacin and metronidazole, his condition has improved, his pain improved, his diet was advanced to clears >> full >> regular, he tolerated that well, he was transitioned to by mouth antibiotics, he was able to take them without further nausea vomiting or abdominal pain, he was discharged home in stable condition and will need 9 additional days of antibiotics to complete a 10 day course.  Discharge Instructions     Medication List    TAKE these medications   ciprofloxacin 500 MG tablet Commonly known as:  CIPRO Take 1 tablet (500 mg total) by mouth 2 (two) times daily.   metroNIDAZOLE 500 MG tablet Commonly known as:  FLAGYL Take 1  tablet (500 mg total) by mouth 3 (three) times daily.   omeprazole 20 MG capsule Commonly known as:  PRILOSEC Take 20 mg by mouth daily as needed (heartburn/acid reflux).   pantoprazole 40 MG tablet Commonly known as:  PROTONIX Take 1 tablet (40 mg total) by mouth daily.   polyethylene glycol packet Commonly known as:  MIRALAX Take 17 g by mouth daily.   promethazine 25 MG tablet Commonly known as:  PHENERGAN Take 1 tablet (25 mg total) by mouth every 6 (six) hours as needed for nausea or vomiting.   traMADol 50 MG tablet Commonly known as:  ULTRAM Take 0.5-1 tablets (25-50 mg total) by mouth every 6 (six) hours as needed.       Allergies  Allergen Reactions  . Penicillins Hives    Has patient had a PCN reaction causing immediate rash, facial/tongue/throat swelling, SOB or lightheadedness with hypotension:Yes Has patient had a PCN reaction causing severe rash involving mucus membranes or skin necrosis:unsure Has patient had a PCN reaction that required hospitalization:Yes Has patient had a PCN reaction occurring within the last 10 years:Yes If all of the above answers are "NO", then may proceed with Cephalosporin use.      Consultations:  None   Procedures/Studies:  none  Dg Chest 2 View  Result Date: 08/13/2015 CLINICAL DATA:  Chest pain for 1 week, worsening today. Status post cardiac catheterization 2016. EXAM: CHEST  2 VIEW COMPARISON:  Chest radiograph July 17, 2015 FINDINGS: Cardiomediastinal silhouette is normal. The lungs are clear without pleural effusions or focal consolidations. Trachea projects midline and there  is no pneumothorax. Soft tissue planes and included osseous structures are non-suspicious. IMPRESSION: Normal chest. Electronically Signed   By: Awilda Metroourtnay  Bloomer M.D.   On: 08/13/2015 01:15   Ct Abdomen Pelvis W Contrast  Result Date: 08/30/2015 CLINICAL DATA:  Right lower quadrant pain and nausea for 1 day, diarrhea EXAM: CT ABDOMEN AND PELVIS  WITH CONTRAST TECHNIQUE: Multidetector CT imaging of the abdomen and pelvis was performed using the standard protocol following bolus administration of intravenous contrast. CONTRAST:  100mL ISOVUE-300 IOPAMIDOL (ISOVUE-300) INJECTION 61% COMPARISON:  None. FINDINGS: Lung bases are unremarkable. Mild fatty infiltration of the liver.  Gallbladder is contracted. Enhanced pancreas is unremarkable. Enhanced spleen is unremarkable. Tiny hiatal hernia. No adrenal gland mass is noted. Enhanced kidneys are symmetrical in size. No hydronephrosis or hydroureter. The urinary bladder is under distended. Nonspecific thickening of urinary bladder wall. Oral contrast material was given to the patient. No gastric outlet obstruction. No small bowel obstruction. The terminal ileum is unremarkable. Colonic diverticula are noted right colon. Axial image 49 there is mild thickening and enhancement of diverticular wall just lateral to the cecum. This is confirmed in coronal image 79. There is small stranding of pericolonic fat. Tiny amount of fluid/stranding is noted just inferior to the cecum. Findings are highly suspicious for acute diverticulitis. Normal appendix is partially visualized in axial image 52 just above the terminal ileum. Few diverticula are noted descending colon. Moderate stool noted in distal sigmoid colon and rectum. The prostate and seminal vesicles are unremarkable. There is small loculated simple fluid collection in left pelvis just medial to external iliac artery measures 2.8 by 1 cm probable small loculated ascites. No peripheral enhancement to suggest an abscess. Sagittal images of the spine shows no destructive bony lesions. Degenerative changes lower thoracic spine. There is no evidence of free abdominal air. No inguinal adenopathy. Small nonspecific bilateral inguinal lymph nodes are noted. IMPRESSION: 1. Colonic diverticula are noted right colon. Axial image 49 there is mild thickening and enhancement of  diverticular wall just lateral to the cecum. This is confirmed in coronal image 79. There is small stranding of pericolonic fat. Tiny amount of fluid/stranding is noted just inferior to the cecum. Findings are highly suspicious for acute diverticulitis. Normal appendix is partially visualized in axial image 52 just above the terminal ileum. 2. No small bowel obstruction. 3. Fatty infiltration of the liver. 4. No hydronephrosis or hydroureter. 5. Small loculated fluid in left anterior pelvis measures 2.8 x 1 cm. No evidence of peripheral enhancement to suggest an abscess. This may represent loculated ascites or a small seroma. Electronically Signed   By: Natasha MeadLiviu  Pop M.D.   On: 08/30/2015 10:47      Subjective: - no chest pain, shortness of breath, no abdominal pain, nausea or vomiting.    Discharge Exam: Vitals:   08/31/15 0517 08/31/15 1407  BP: 106/67 140/86  Pulse: (!) 45 (!) 52  Resp: 18 18  Temp: 98.1 F (36.7 C) 98.7 F (37.1 C)   Vitals:   08/30/15 1535 08/30/15 2000 08/31/15 0517 08/31/15 1407  BP: 119/76 119/63 106/67 140/86  Pulse: 97 (!) 50 (!) 45 (!) 52  Resp: 18 20 18 18   Temp: 97.7 F (36.5 C) 98.3 F (36.8 C) 98.1 F (36.7 C) 98.7 F (37.1 C)  TempSrc: Oral Oral Oral Oral  SpO2: 100% 100% 100% 100%  Weight:      Height:        General: Pt is alert, awake, not in acute  distress Cardiovascular: RRR, S1/S2 +, no rubs, no gallops Respiratory: CTA bilaterally, no wheezing, no rhonchi Abdominal: Soft, NT, ND, bowel sounds +   The results of significant diagnostics from this hospitalization (including imaging, microbiology, ancillary and laboratory) are listed below for reference.     Microbiology: No results found for this or any previous visit (from the past 240 hour(s)).   Labs: BNP (last 3 results) No results for input(s): BNP in the last 8760 hours. Basic Metabolic Panel:  Recent Labs Lab 08/30/15 0815 08/31/15 0559  NA 138 138  K 3.5 4.2  CL 106  107  CO2 27 27  GLUCOSE 159* 83  BUN 8 7  CREATININE 0.90 0.82  CALCIUM 9.1 8.7*   Liver Function Tests:  Recent Labs Lab 08/30/15 0815  AST 25  ALT 43  ALKPHOS 60  BILITOT 0.5  PROT 7.4  ALBUMIN 4.1    Recent Labs Lab 08/30/15 0815  LIPASE 22   No results for input(s): AMMONIA in the last 168 hours. CBC:  Recent Labs Lab 08/30/15 0815  WBC 8.9  HGB 14.8  HCT 43.5  MCV 89.7  PLT 275   Cardiac Enzymes:  Recent Labs Lab 08/31/15 0024  TROPONINI <0.03   BNP: Invalid input(s): POCBNP CBG: No results for input(s): GLUCAP in the last 168 hours. D-Dimer No results for input(s): DDIMER in the last 72 hours. Hgb A1c No results for input(s): HGBA1C in the last 72 hours. Lipid Profile No results for input(s): CHOL, HDL, LDLCALC, TRIG, CHOLHDL, LDLDIRECT in the last 72 hours. Thyroid function studies No results for input(s): TSH, T4TOTAL, T3FREE, THYROIDAB in the last 72 hours.  Invalid input(s): FREET3 Anemia work up No results for input(s): VITAMINB12, FOLATE, FERRITIN, TIBC, IRON, RETICCTPCT in the last 72 hours. Urinalysis    Component Value Date/Time   COLORURINE YELLOW 08/30/2015 0815   APPEARANCEUR CLEAR 08/30/2015 0815   LABSPEC 1.027 08/30/2015 0815   PHURINE 6.0 08/30/2015 0815   GLUCOSEU NEGATIVE 08/30/2015 0815   HGBUR NEGATIVE 08/30/2015 0815   BILIRUBINUR NEGATIVE 08/30/2015 0815   KETONESUR NEGATIVE 08/30/2015 0815   PROTEINUR NEGATIVE 08/30/2015 0815   NITRITE NEGATIVE 08/30/2015 0815   LEUKOCYTESUR NEGATIVE 08/30/2015 0815   Sepsis Labs Invalid input(s): PROCALCITONIN,  WBC,  LACTICIDVEN Microbiology No results found for this or any previous visit (from the past 240 hour(s)).   Time coordinating discharge: Over 30 minutes  SIGNED:  Pamella PertGHERGHE, COSTIN, MD  [facility] 08/31/2015, 3:13 PM Pager [phone]  If 7PM-7AM, please contact night-coverage [email or web address] Password [email or web address]

## 2015-08-31 NOTE — Discharge Instructions (Signed)
Diverticulitis °Diverticulitis is inflammation or infection of small pouches in your colon that form when you have a condition called diverticulosis. The pouches in your colon are called diverticula. Your colon, or large intestine, is where water is absorbed and stool is formed. °Complications of diverticulitis can include: °· Bleeding. °· Severe infection. °· Severe pain. °· Perforation of your colon. °· Obstruction of your colon. °CAUSES  °Diverticulitis is caused by bacteria. °Diverticulitis happens when stool becomes trapped in diverticula. This allows bacteria to grow in the diverticula, which can lead to inflammation and infection. °RISK FACTORS °People with diverticulosis are at risk for diverticulitis. Eating a diet that does not include enough fiber from fruits and vegetables may make diverticulitis more likely to develop. °SYMPTOMS  °Symptoms of diverticulitis may include: °· Abdominal pain and tenderness. The pain is normally located on the left side of the abdomen, but may occur in other areas. °· Fever and chills. °· Bloating. °· Cramping. °· Nausea. °· Vomiting. °· Constipation. °· Diarrhea. °· Blood in your stool. °DIAGNOSIS  °Your health care provider will ask you about your medical history and do a physical exam. You may need to have tests done because many medical conditions can cause the same symptoms as diverticulitis. Tests may include: °· Blood tests. °· Urine tests. °· Imaging tests of the abdomen, including X-rays and CT scans. °When your condition is under control, your health care provider may recommend that you have a colonoscopy. A colonoscopy can show how severe your diverticula are and whether something else is causing your symptoms. °TREATMENT  °Most cases of diverticulitis are mild and can be treated at home. Treatment may include: °· Taking over-the-counter pain medicines. °· Following a clear liquid diet. °· Taking antibiotic medicines by mouth for 7-10 days. °More severe cases may  be treated at a hospital. Treatment may include: °· Not eating or drinking. °· Taking prescription pain medicine. °· Receiving antibiotic medicines through an IV tube. °· Receiving fluids and nutrition through an IV tube. °· Surgery. °HOME CARE INSTRUCTIONS  °· Follow your health care provider's instructions carefully. °· Follow a full liquid diet or other diet as directed by your health care provider. After your symptoms improve, your health care provider may tell you to change your diet. He or she may recommend you eat a high-fiber diet. Fruits and vegetables are good sources of fiber. Fiber makes it easier to pass stool. °· Take fiber supplements or probiotics as directed by your health care provider. °· Only take medicines as directed by your health care provider. °· Keep all your follow-up appointments. °SEEK MEDICAL CARE IF:  °· Your pain does not improve. °· You have a hard time eating food. °· Your bowel movements do not return to normal. °SEEK IMMEDIATE MEDICAL CARE IF:  °· Your pain becomes worse. °· Your symptoms do not get better. °· Your symptoms suddenly get worse. °· You have a fever. °· You have repeated vomiting. °· You have bloody or black, tarry stools. °MAKE SURE YOU:  °· Understand these instructions. °· Will watch your condition. °· Will get help right away if you are not doing well or get worse. °  °This information is not intended to replace advice given to you by your health care provider. Make sure you discuss any questions you have with your health care provider. °  °Document Released: 10/04/2004 Document Revised: 12/30/2012 Document Reviewed: 11/19/2012 °Elsevier Interactive Patient Education ©2016 [employer] ° °

## 2015-12-08 ENCOUNTER — Emergency Department (HOSPITAL_COMMUNITY)
Admission: EM | Admit: 2015-12-08 | Discharge: 2015-12-08 | Disposition: A | Discharge status: Home / Self Care | Payer: 59 | Attending: Emergency Medicine | Admitting: Emergency Medicine

## 2015-12-08 ENCOUNTER — Encounter (HOSPITAL_COMMUNITY): Payer: Self-pay | Admitting: Emergency Medicine

## 2015-12-08 ENCOUNTER — Emergency Department (HOSPITAL_COMMUNITY)
Admission: EM | Admit: 2015-12-08 | Discharge: 2015-12-08 | Disposition: A | Discharge status: Home / Self Care | Payer: 59 | Source: Home / Self Care

## 2015-12-08 DIAGNOSIS — Y990 Civilian activity done for income or pay: Secondary | ICD-10-CM | POA: Insufficient documentation

## 2015-12-08 DIAGNOSIS — Y929 Unspecified place or not applicable: Secondary | ICD-10-CM | POA: Insufficient documentation

## 2015-12-08 DIAGNOSIS — F17210 Nicotine dependence, cigarettes, uncomplicated: Secondary | ICD-10-CM | POA: Diagnosis not present

## 2015-12-08 DIAGNOSIS — X509XXA Other and unspecified overexertion or strenuous movements or postures, initial encounter: Secondary | ICD-10-CM

## 2015-12-08 DIAGNOSIS — Z5321 Procedure and treatment not carried out due to patient leaving prior to being seen by health care provider: Secondary | ICD-10-CM | POA: Insufficient documentation

## 2015-12-08 DIAGNOSIS — Y939 Activity, unspecified: Secondary | ICD-10-CM | POA: Insufficient documentation

## 2015-12-08 DIAGNOSIS — S161XXA Strain of muscle, fascia and tendon at neck level, initial encounter: Secondary | ICD-10-CM | POA: Diagnosis not present

## 2015-12-08 DIAGNOSIS — X58XXXA Exposure to other specified factors, initial encounter: Secondary | ICD-10-CM | POA: Diagnosis not present

## 2015-12-08 DIAGNOSIS — Z79899 Other long term (current) drug therapy: Secondary | ICD-10-CM | POA: Insufficient documentation

## 2015-12-08 DIAGNOSIS — M62838 Other muscle spasm: Secondary | ICD-10-CM

## 2015-12-08 DIAGNOSIS — M542 Cervicalgia: Secondary | ICD-10-CM

## 2015-12-08 DIAGNOSIS — S199XXA Unspecified injury of neck, initial encounter: Secondary | ICD-10-CM | POA: Diagnosis present

## 2015-12-08 MED ORDER — NAPROXEN 500 MG PO TABS: 500.0000 mg | ORAL_TABLET | Freq: Two times a day (BID) | ORAL | 0 refills | Status: DC

## 2015-12-08 MED ORDER — DIAZEPAM 5 MG PO TABS: 5.0000 mg | ORAL_TABLET | Freq: Two times a day (BID) | ORAL | 0 refills | Status: DC

## 2015-12-08 NOTE — ED Notes (Signed)
All pt's accounted for in the lobby.  This pt is no longer present.  Will d/c [facility]BS.

## 2015-12-08 NOTE — ED Notes (Signed)
Discharge instructions, follow up care, and rx x2 reviewed with patient. Patient verbalized understanding. 

## 2015-12-08 NOTE — ED Triage Notes (Signed)
Patient is complaining of right-sided neck pain x2 weeks. Patient came to the ED last night but [facility]BS due to wait time. Patient is conscious, alert, oriented, ambulatory.

## 2015-12-08 NOTE — ED Provider Notes (Signed)
[facility]-EMERGENCY DEPT Provider Note   CSN: [id number] Arrival date & time: 12/08/15  0719     History   Chief Complaint Chief Complaint  Patient presents with  . Neck Pain    HPI Kyle AmassJames Ozaki is a 35 y.o. male.  HPI Pt comes in with cc of neck pain. 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 Reports that for the past week he has had increased neck pain, especially when he turns his neck to the left side. Pt is having scapular region pain and sometimes numbness. Pt couldn't sleep last night because of the discomfort, thus he came to the ER. At work, pt does lift > 50 lbs all the time.  Past Medical History:  Diagnosis Date  . Anginal pain (HCC) 07/13/2014   "mild"  . Diverticulitis   . Family history of adverse reaction to anesthesia    "dad had allergic reaction to anesthesia"   . GERD (gastroesophageal reflux disease)   . MI (mitral incompetence)     Patient Active Problem List   Diagnosis Date Noted  . Acute diverticulitis 08/30/2015  . Current smoker 07/13/2014  . Family history of premature CAD 07/13/2014  . Obese 07/13/2014  . Unstable angina (HCC) 07/13/2014  . Chest pain     Past Surgical History:  Procedure Laterality Date  . CARDIAC CATHETERIZATION N/A 07/13/2014   Procedure: Left Heart Cath and Coronary Angiography;  Surgeon: Marykay Lexavid W Harding, MD;  [facility];  Service: Cardiovascular;  Laterality: N/A;  . CYSTECTOMY Left ~ 2014   "wrist"  . CYSTECTOMY Left ~ 2014   "2 on my foot""       Home Medications    Prior to Admission medications   Medication Sig Start Date End Date Taking? Authorizing Provider  ciprofloxacin (CIPRO) 500 MG tablet Take 1 tablet (500 mg total) by mouth 2 (two) times daily. 08/31/15   Costin Otelia SergeantM Gherghe, MD  diazepam (VALIUM) 5 MG tablet Take 1 tablet (5 mg total) by mouth 2 (two) times daily. 12/08/15   Derwood KaplanAnkit Judson Tsan, MD  metroNIDAZOLE (FLAGYL) 500 MG tablet Take 1 tablet (500 mg total) by mouth 3 (three)  times daily. 08/31/15   Costin Otelia SergeantM Gherghe, MD  naproxen (NAPROSYN) 500 MG tablet Take 1 tablet (500 mg total) by mouth 2 (two) times daily with a meal. 12/08/15   Derwood KaplanAnkit Britaney Espaillat, MD  omeprazole (PRILOSEC) 20 MG capsule Take 20 mg by mouth daily as needed (heartburn/acid reflux).    Historical Provider, MD  pantoprazole (PROTONIX) 40 MG tablet Take 1 tablet (40 mg total) by mouth daily. Patient not taking: Reported on 08/30/2015 07/14/14   Azalee CourseHao Meng, PA  polyethylene glycol [facility](MIRALAX) packet Take 17 g by mouth daily. 08/30/15   Benjiman CoreNathan Pickering, MD  promethazine (PHENERGAN) 25 MG tablet Take 1 tablet (25 mg total) by mouth every 6 (six) hours as needed for nausea or vomiting. 08/30/15   Benjiman CoreNathan Pickering, MD  traMADol (ULTRAM) 50 MG tablet Take 0.5-1 tablets (25-50 mg total) by mouth every 6 (six) hours as needed. 08/31/15   Costin Otelia SergeantM Gherghe, MD    Family History Family History  Problem Relation Age of Onset  . AAA (abdominal aortic aneurysm) Mother   . Heart failure Father     Social History Social History  Substance Use Topics  . Smoking status: Current Every Day Smoker    Packs/day: 0.50    Years: 0.00    Types: Cigarettes  . Smokeless tobacco: Never Used  .  Alcohol use No     Allergies   Penicillins   Review of Systems Review of Systems  Constitutional: Positive for activity change.  Musculoskeletal: Positive for myalgias, neck pain and neck stiffness.  Skin: Negative for wound.  Neurological: Negative for weakness and numbness.     Physical Exam Updated Vital Signs BP 125/85 (BP Location: Right Arm)   Pulse 84   Temp 98.3 F (36.8 C) (Oral)   Resp 16   Ht 5\' 10"  (1.778 m)   Wt 250 lb (113.4 kg)   SpO2 100%   BMI 35.87 kg/m   Physical Exam  Constitutional: He is oriented to person, place, and time. He appears well-developed.  HENT:  Head: Atraumatic.  Neck: Neck supple.  Cardiovascular: Normal rate.   Pulmonary/Chest: Effort normal.  Musculoskeletal: He exhibits  tenderness.  Palpable paracervical spasms and scapular spasms, tenderness is worse with palpation and movement.  Neurological: He is alert and oriented to person, place, and time.  Skin: Skin is warm.  Nursing note and vitals reviewed.    ED Treatments / Results  Labs (all labs ordered are listed, but only abnormal results are displayed) Labs Reviewed - No data to display  EKG  EKG Interpretation None       Radiology No results found.  Procedures Procedures (including critical care time)  Medications Ordered in ED Medications - No data to display   Initial Impression / Assessment and Plan / ED Course  I have reviewed the triage vital signs and the nursing notes.  Pertinent labs & imaging results that were available during my care of the patient were reviewed by me and considered in my medical decision making (see chart for details).  Clinical Course    Pt has neck spasm and scapular region muscle spasms. - RICE advised. - Warm showers/compressses advised. - Neck Stretches demonstrated and encouraged. - Muscle relaxant prescribed, side effects of drowsiness discussed. - Work note provided. Weight restriction for 1 week discussed.   Final Clinical Impressions(s) / ED Diagnoses   Final diagnoses:  Acute strain of neck muscle, initial encounter  Muscle spasms of neck    New Prescriptions Discharge Medication List as of 12/08/2015  8:05 AM    START taking these medications   Details  diazepam (VALIUM) 5 MG tablet Take 1 tablet (5 mg total) by mouth 2 (two) times daily., Starting Thu 12/08/2015, Print    naproxen (NAPROSYN) 500 MG tablet Take 1 tablet (500 mg total) by mouth 2 (two) times daily with a meal., Starting Thu 12/08/2015, Print         Derwood KaplanAnkit Yanni Ruberg, MD 12/08/15 [phone]

## 2015-12-08 NOTE — ED Notes (Signed)
Called for room however didn't answer.  Will call again.

## 2015-12-08 NOTE — Discharge Instructions (Signed)
Do all the stretching exercises we went over. You may use even reliable Internet sources to review those exercises. Take the meds prescribed. Laying in warm water should also help. Stretching is the most important concept however, along with giving your body rest from heavy lifting to recover.  VALIUM WILL MAKE YOU SLEEPY - SO WE RECOMMEND NOT DRIVING OR USING HEAVY MACHINERY AFTER TAKING VALIUM.

## 2015-12-08 NOTE — ED Triage Notes (Signed)
Pt from home with complaints of right sided neck pain x 3 weeks. 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 Pt denies radiation of pain.

## 2016-01-08 IMAGING — US US SCROTUM
1 series · 13 of 25 positions shown · non-contrast
Comparison: None.

CLINICAL DATA: Initial evaluation for acute right testicular pain.

EXAM:
SCROTAL ULTRASOUND
DOPPLER ULTRASOUND OF THE TESTICLES
TECHNIQUE: Complete ultrasound examination of the testicles, epididymis, and
other scrotal structures was performed. Color and spectral Doppler
ultrasound were also utilized to evaluate blood flow to the
testicles.

[Series 1: us scrotum · 0.07mm/px · 13 of 66 slices shown]
[im 1/66]
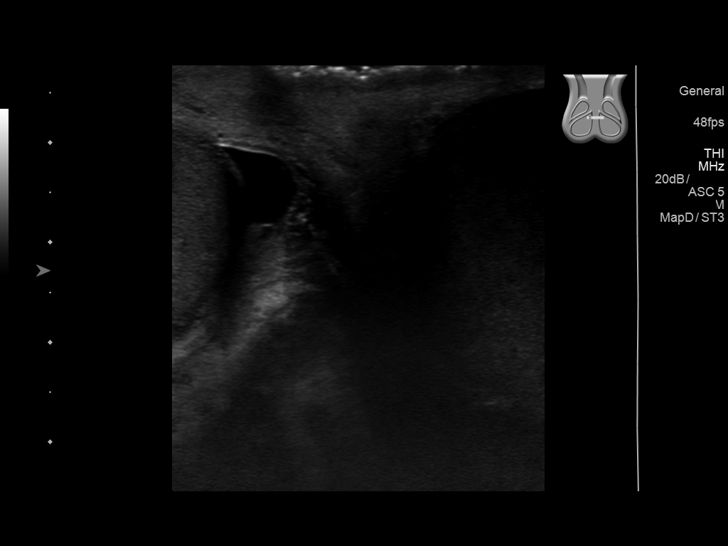
[im 6/66]
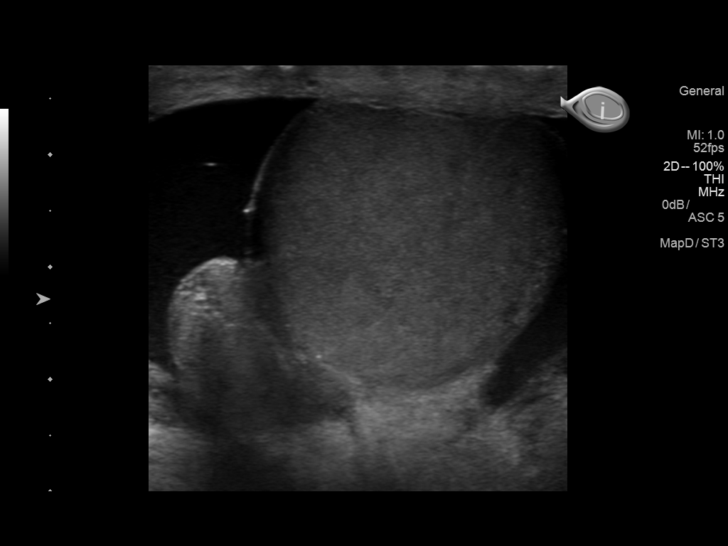
[im 11/66]
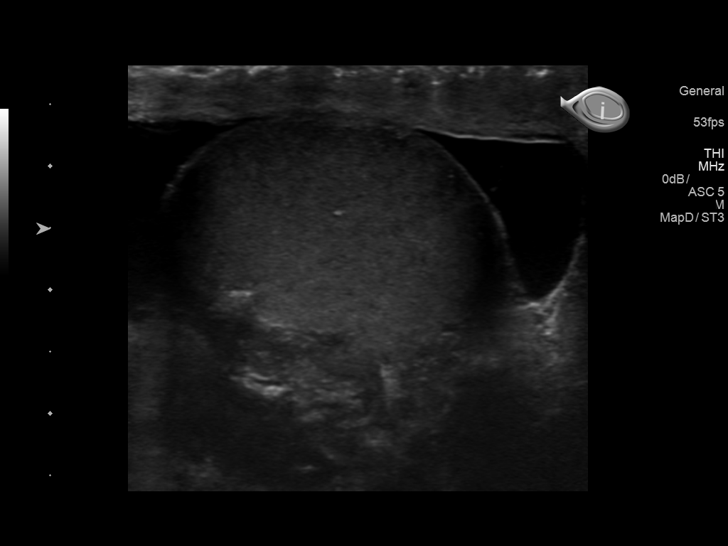
[im 17/66]
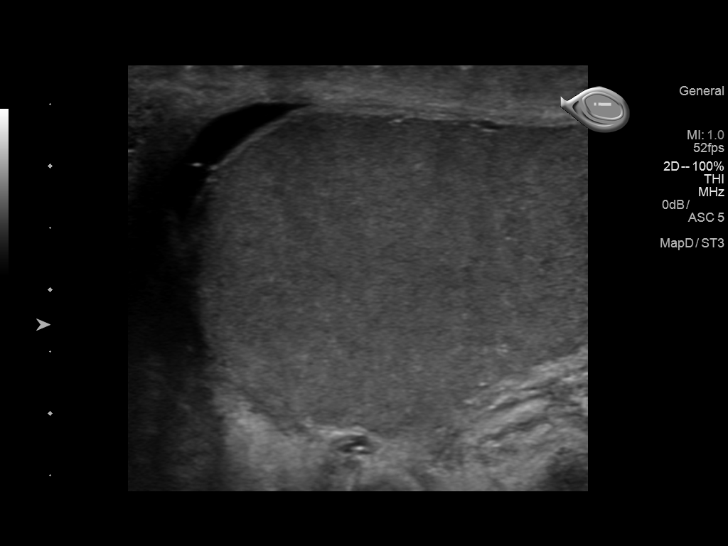
[im 22/66]
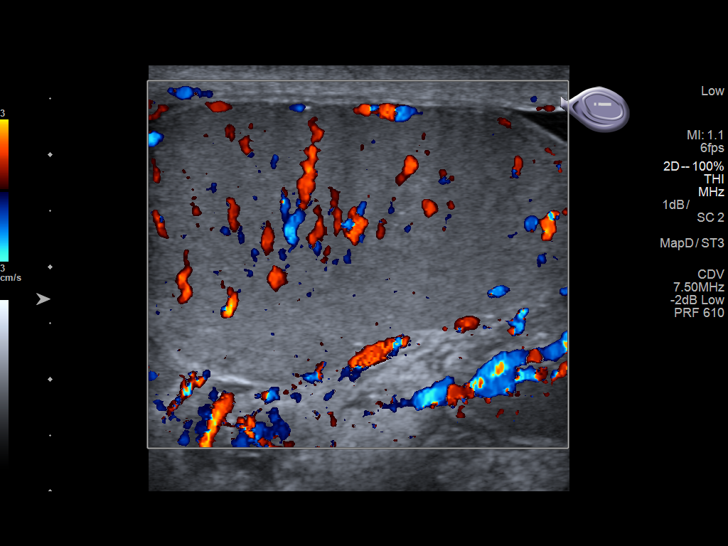
[im 28/66]
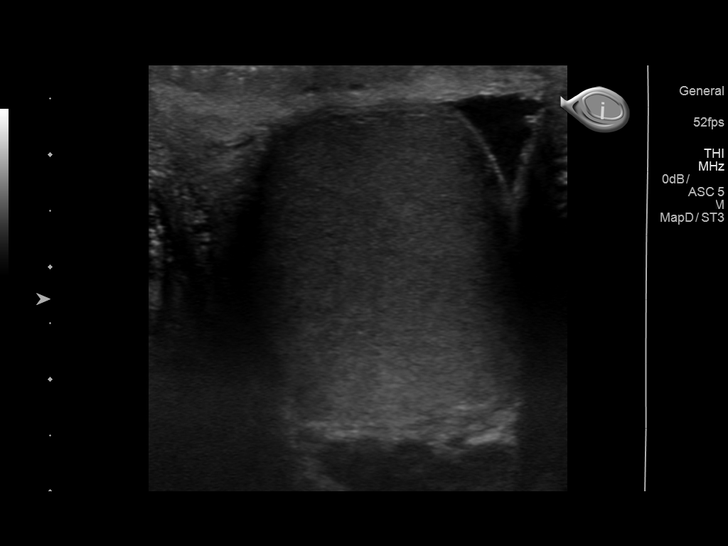
[im 33/66]
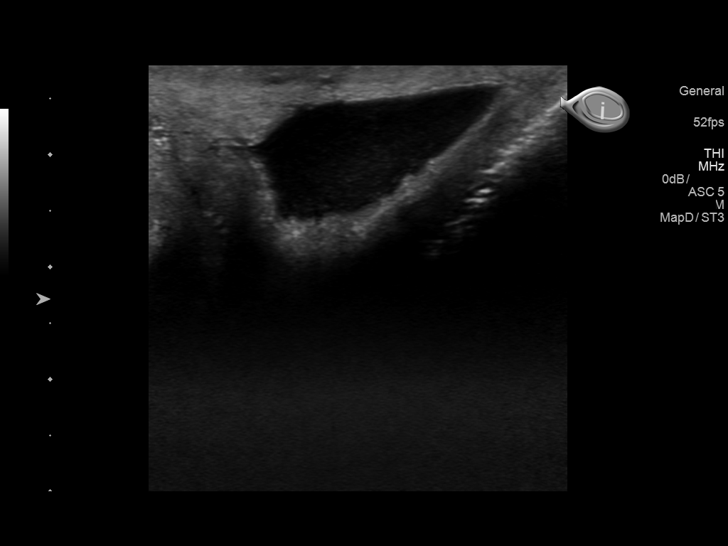
[im 38/66]
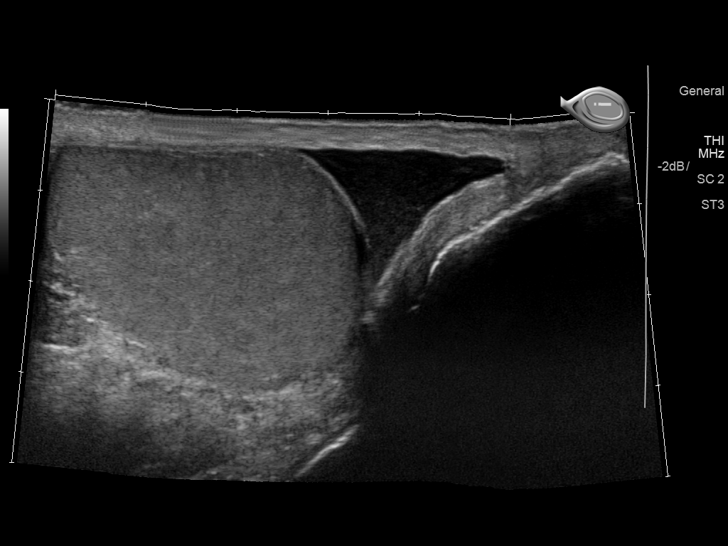
[im 44/66]
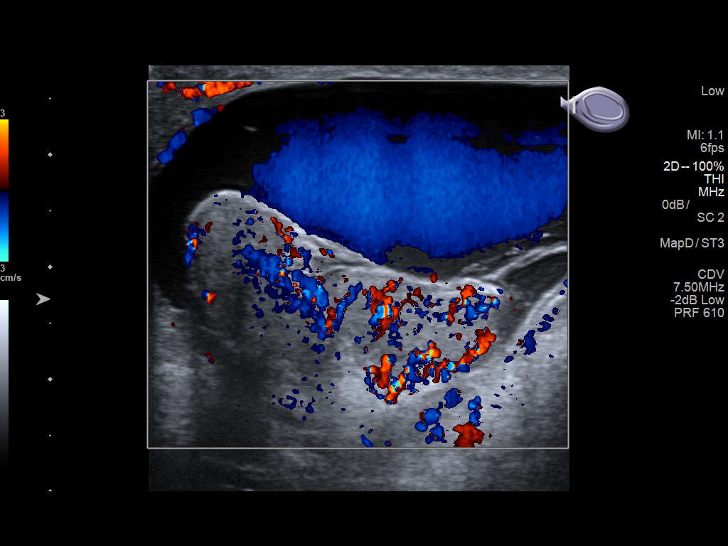
[im 49/66]
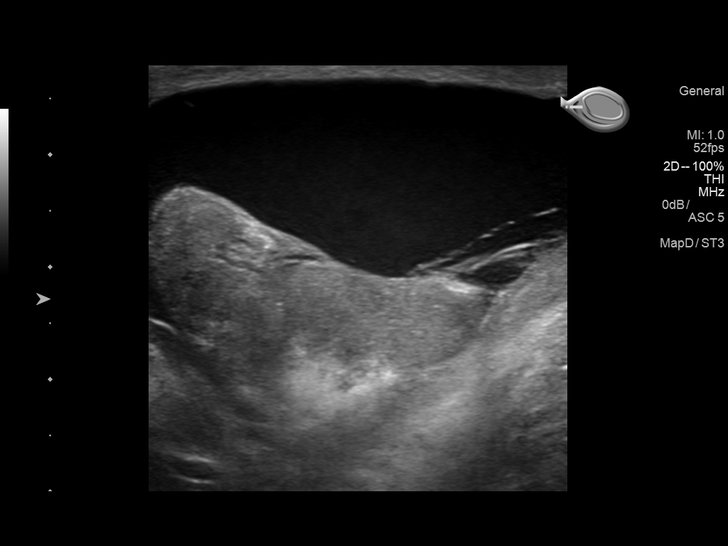
[im 55/66]
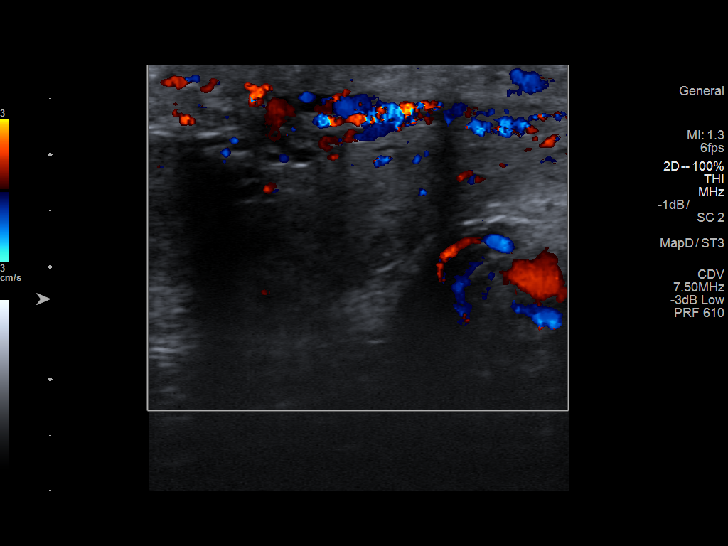
[im 60/66]
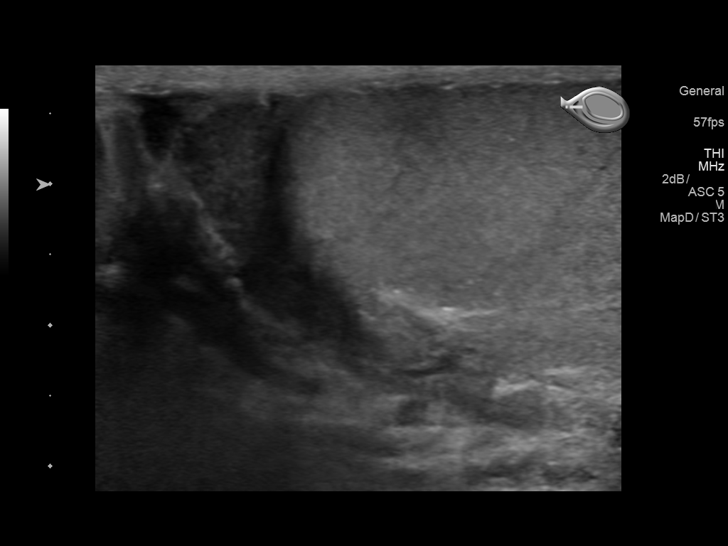
[im 66/66]
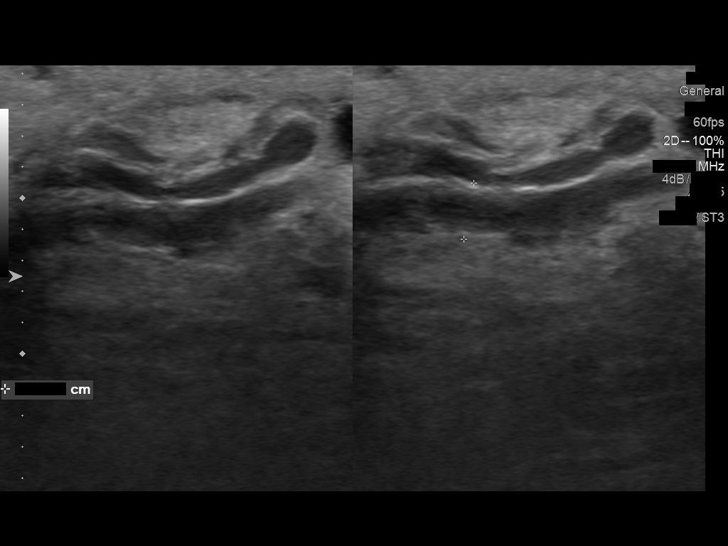

[13 of 25 positions shown; findings below may reference images not displayed]

FINDINGS: Right testicle

Measurements: 4.6 x 2.4 x 2.9 cm. No mass or microlithiasis
visualized. Mildly increased vascularity as compared to the left
testicle.

Left testicle

Measurements: 3.9 x 2.7 x 2.6 cm. No mass or microlithiasis
visualized.

Right epididymis: Increased prominence of the epididymal head and
tail with heterogeneous appearance and increased vascularity,
suggesting acute epididymitis.

Left epididymis:  Normal in size and appearance.

Hydrocele:  Bilateral hydroceles, right greater than left.

Varicocele:  Left-sided varicocele.

Pulsed Doppler interrogation of both testes demonstrates normal low
resistance arterial and venous waveforms bilaterally.
IMPRESSION: 1. Increased prominence of the right epididymal head and tail with
heterogeneous echotexture and increased vascularity, with associated
increased vascularity within the right testis. Findings suggestive
of acute right-sided epididymo-orchitis.
2. Bilateral hydroceles, right greater than left, likely reactive.
3. Small left varicocele.
4. No evidence for testicular torsion.

## 2016-02-08 ENCOUNTER — Emergency Department (HOSPITAL_COMMUNITY)
Admission: EM | Admit: 2016-02-08 | Discharge: 2016-02-08 | Disposition: A | Discharge status: Home / Self Care | Payer: Worker's Compensation | Attending: Emergency Medicine | Admitting: Emergency Medicine

## 2016-02-08 ENCOUNTER — Encounter (HOSPITAL_COMMUNITY): Payer: Self-pay

## 2016-02-08 DIAGNOSIS — S39012A Strain of muscle, fascia and tendon of lower back, initial encounter: Secondary | ICD-10-CM | POA: Diagnosis not present

## 2016-02-08 DIAGNOSIS — Y929 Unspecified place or not applicable: Secondary | ICD-10-CM | POA: Insufficient documentation

## 2016-02-08 DIAGNOSIS — Y939 Activity, unspecified: Secondary | ICD-10-CM | POA: Insufficient documentation

## 2016-02-08 DIAGNOSIS — Y999 Unspecified external cause status: Secondary | ICD-10-CM | POA: Diagnosis not present

## 2016-02-08 DIAGNOSIS — F17210 Nicotine dependence, cigarettes, uncomplicated: Secondary | ICD-10-CM | POA: Insufficient documentation

## 2016-02-08 DIAGNOSIS — X501XXA Overexertion from prolonged static or awkward postures, initial encounter: Secondary | ICD-10-CM | POA: Diagnosis not present

## 2016-02-08 DIAGNOSIS — S3992XA Unspecified injury of lower back, initial encounter: Secondary | ICD-10-CM | POA: Diagnosis present

## 2016-02-08 MED ORDER — OXYCODONE HCL 5 MG PO TABS
5.0000 mg | ORAL_TABLET | Freq: Once | ORAL | Status: AC
  Administered 2016-02-08: 5 mg via ORAL
  Filled 2016-02-08: qty 1

## 2016-02-08 MED ORDER — DIAZEPAM 5 MG PO TABS
5.0000 mg | ORAL_TABLET | Freq: Once | ORAL | Status: AC
  Administered 2016-02-08: 5 mg via ORAL
  Filled 2016-02-08: qty 1

## 2016-02-08 MED ORDER — KETOROLAC TROMETHAMINE 60 MG/2ML IM SOLN
60.0000 mg | Freq: Once | INTRAMUSCULAR | Status: AC
  Administered 2016-02-08: 60 mg via INTRAMUSCULAR
  Filled 2016-02-08: qty 2

## 2016-02-08 MED ORDER — ACETAMINOPHEN 500 MG PO TABS
1000.0000 mg | ORAL_TABLET | Freq: Once | ORAL | Status: AC
  Administered 2016-02-08: 1000 mg via ORAL
  Filled 2016-02-08: qty 2

## 2016-02-08 NOTE — Discharge Instructions (Signed)
Take 4 over the counter ibuprofen tablets 3 times a day or 2 over-the-counter naproxen tablets twice a day for pain. Also take tylenol 1000mg(2 extra strength) four times a day.    

## 2016-02-08 NOTE — ED Provider Notes (Signed)
[facility]-EMERGENCY DEPT Provider Note   CSN: [id number] Arrival date & time: 02/08/16  [id number]     History   Chief Complaint Chief Complaint  Patient presents with  . Back Pain    HPI Kyle Silva is a 36 y.o. male.  36 yo M with a cc of low back pain.  Started after twisting to reach something and felt a pop.  Unable to get out of bed since then without severe pain. Denies traumatic injury.  Denies loss of bowel, bladder. Denies loss of perirectal sensation. No abdominal pain, no fevers.  No radiation of the pain.    The history is provided by the patient.  Back Pain   This is a new problem. The current episode started 2 days ago. The problem occurs constantly. The problem has not changed since onset.The pain is associated with twisting. The pain is present in the lumbar spine. The quality of the pain is described as shooting. The pain does not radiate. The pain is at a severity of 8/10. The pain is severe. The symptoms are aggravated by twisting, bending and certain positions. Pertinent negatives include no chest pain, no fever, no headaches and no abdominal pain. He has tried nothing for the symptoms. The treatment provided no relief.    Past Medical History:  Diagnosis Date  . Anginal pain (HCC) 07/13/2014   "mild"  . Diverticulitis   . Family history of adverse reaction to anesthesia    "dad had allergic reaction to anesthesia"   . GERD (gastroesophageal reflux disease)   . MI (mitral incompetence)     Patient Active Problem List   Diagnosis Date Noted  . Acute diverticulitis 08/30/2015  . Current smoker 07/13/2014  . Family history of premature CAD 07/13/2014  . Obese 07/13/2014  . Unstable angina (HCC) 07/13/2014  . Chest pain     Past Surgical History:  Procedure Laterality Date  . CARDIAC CATHETERIZATION N/A 07/13/2014   Procedure: Left Heart Cath and Coronary Angiography;  Surgeon: Marykay Lexavid W Harding, MD;  [facility];  Service: Cardiovascular;   Laterality: N/A;  . CYSTECTOMY Left ~ 2014   "wrist"  . CYSTECTOMY Left ~ 2014   "2 on my foot""       Home Medications    Prior to Admission medications   Medication Sig Start Date End Date Taking? Authorizing Provider  ciprofloxacin (CIPRO) 500 MG tablet Take 1 tablet (500 mg total) by mouth 2 (two) times daily. 08/31/15   Costin Otelia SergeantM Gherghe, MD  diazepam (VALIUM) 5 MG tablet Take 1 tablet (5 mg total) by mouth 2 (two) times daily. 12/08/15   Derwood KaplanAnkit Nanavati, MD  metroNIDAZOLE (FLAGYL) 500 MG tablet Take 1 tablet (500 mg total) by mouth 3 (three) times daily. 08/31/15   Costin Otelia SergeantM Gherghe, MD  naproxen (NAPROSYN) 500 MG tablet Take 1 tablet (500 mg total) by mouth 2 (two) times daily with a meal. 12/08/15   Derwood KaplanAnkit Nanavati, MD  omeprazole (PRILOSEC) 20 MG capsule Take 20 mg by mouth daily as needed (heartburn/acid reflux).    Historical Provider, MD  pantoprazole (PROTONIX) 40 MG tablet Take 1 tablet (40 mg total) by mouth daily. Patient not taking: Reported on 08/30/2015 07/14/14   Azalee CourseHao Meng, PA  polyethylene glycol [facility](MIRALAX) packet Take 17 g by mouth daily. 08/30/15   Benjiman CoreNathan Pickering, MD  promethazine (PHENERGAN) 25 MG tablet Take 1 tablet (25 mg total) by mouth every 6 (six) hours as needed for nausea or vomiting. 08/30/15  Benjiman Core, MD  traMADol (ULTRAM) 50 MG tablet Take 0.5-1 tablets (25-50 mg total) by mouth every 6 (six) hours as needed. 08/31/15   Costin Otelia Sergeant, MD    Family History Family History  Problem Relation Age of Onset  . AAA (abdominal aortic aneurysm) Mother   . Heart failure Father     Social History Social History  Substance Use Topics  . Smoking status: Current Every Day Smoker    Packs/day: 0.50    Years: 0.00    Types: Cigarettes  . Smokeless tobacco: Never Used  . Alcohol use No     Allergies   Penicillins   Review of Systems Review of Systems  Constitutional: Negative for chills and fever.  HENT: Negative for congestion and facial  swelling.   Eyes: Negative for discharge and visual disturbance.  Respiratory: Negative for shortness of breath.   Cardiovascular: Negative for chest pain and palpitations.  Gastrointestinal: Negative for abdominal pain, diarrhea and vomiting.  Musculoskeletal: Positive for back pain and myalgias. Negative for arthralgias.  Skin: Negative for color change and rash.  Neurological: Negative for tremors, syncope and headaches.  Psychiatric/Behavioral: Negative for confusion and dysphoric mood.     Physical Exam Updated Vital Signs BP 108/60 (BP Location: Right Arm)   Pulse 81   Temp 98.3 F (36.8 C) (Oral)   Resp 18   Ht 5\' 10"  (1.778 m)   Wt 250 lb (113.4 kg)   SpO2 100%   BMI 35.87 kg/m   Physical Exam  Constitutional: He is oriented to person, place, and time. He appears well-developed and well-nourished.  HENT:  Head: Normocephalic and atraumatic.  Eyes: EOM are normal. Pupils are equal, round, and reactive to light.  Neck: Normal range of motion. Neck supple. No JVD present.  Cardiovascular: Normal rate and regular rhythm.  Exam reveals no gallop and no friction rub.   No murmur heard. Pulmonary/Chest: No respiratory distress. He has no wheezes.  Abdominal: He exhibits no distension and no mass. There is no tenderness. There is no rebound and no guarding.  Musculoskeletal: Normal range of motion. He exhibits tenderness (lower back, diffuse with some spasm.  PMS intact distally, no clonus).  Neurological: He is alert and oriented to person, place, and time.  Skin: No rash noted. No pallor.  Psychiatric: He has a normal mood and affect. His behavior is normal.  Nursing note and vitals reviewed.    ED Treatments / Results  Labs (all labs ordered are listed, but only abnormal results are displayed) Labs Reviewed - No data to display  EKG  EKG Interpretation None       Radiology No results found.  Procedures Procedures (including critical care  time)  Medications Ordered in ED Medications  ketorolac (TORADOL) injection 60 mg (not administered)  diazepam (VALIUM) tablet 5 mg (not administered)  oxyCODONE (Oxy IR/ROXICODONE) immediate release tablet 5 mg (not administered)  acetaminophen (TYLENOL) tablet 1,000 mg (not administered)     Initial Impression / Assessment and Plan / ED Course  I have reviewed the triage vital signs and the nursing notes.  Pertinent labs & imaging results that were available during my care of the patient were reviewed by me and considered in my medical decision making (see chart for details).     36 yo M with low back pain.  No traumatic dont feel that imaging is warranted at this time.  Will treat symptomatically.  PCP follow up.   9:13 AM:  I have  discussed the diagnosis/risks/treatment options with the patient and believe the pt to be eligible for discharge home to follow-up with PCP. We also discussed returning to the ED immediately if new or worsening sx occur. We discussed the sx which are most concerning (e.g., sudden worsening pain, fever, inability to tolerate by mouth) that necessitate immediate return. Medications administered to the patient during their visit and any new prescriptions provided to the patient are listed below.  Medications given during this visit Medications  ketorolac (TORADOL) injection 60 mg (not administered)  diazepam (VALIUM) tablet 5 mg (not administered)  oxyCODONE (Oxy IR/ROXICODONE) immediate release tablet 5 mg (not administered)  acetaminophen (TYLENOL) tablet 1,000 mg (not administered)     The patient appears reasonably screen and/or stabilized for discharge and I doubt any other medical condition or other [facility] requiring further screening, evaluation, or treatment in the ED at this time prior to discharge.    Final Clinical Impressions(s) / ED Diagnoses   Final diagnoses:  Strain of lumbar region, initial encounter    New Prescriptions New  Prescriptions   No medications on file     Melene Plan, DO 02/08/16 0913

## 2016-02-08 NOTE — ED Triage Notes (Signed)
Pt presents with c/o lower back pain that he felt when he was working yesterday. Pt reports the pain is worse when ambulating. Pt reports he "can't walk, sit up, or get out of his bed."

## 2016-03-13 IMAGING — US US ART/VEN ABD/PELV/SCROTUM DOPPLER LTD
1 series · 14 of 25 positions shown · non-contrast
Comparison: [DATE]

CLINICAL DATA: Right testicular pain

EXAM:
SCROTAL ULTRASOUND
DOPPLER ULTRASOUND OF THE TESTICLES
TECHNIQUE: Complete ultrasound examination of the testicles, epididymis, and
other scrotal structures was performed. Color and spectral Doppler
ultrasound were also utilized to evaluate blood flow to the
testicles.

[Series 1: us art/ven abd/pelv/scrotum doppler ltd · 0.06mm/px · 14 of 72 slices shown]
[im 1/72]
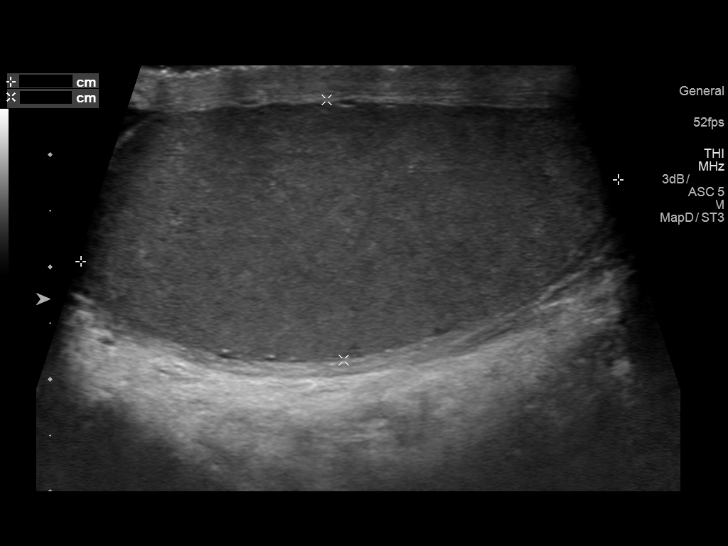
[im 6/72]
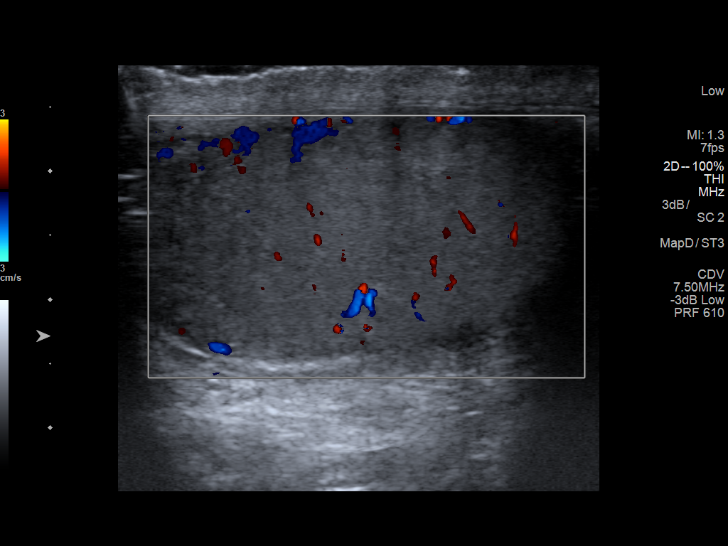
[im 12/72]
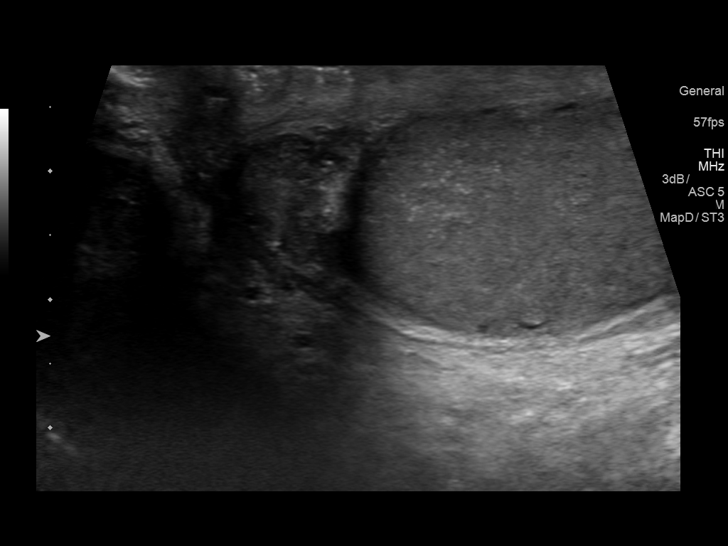
[im 18/72]
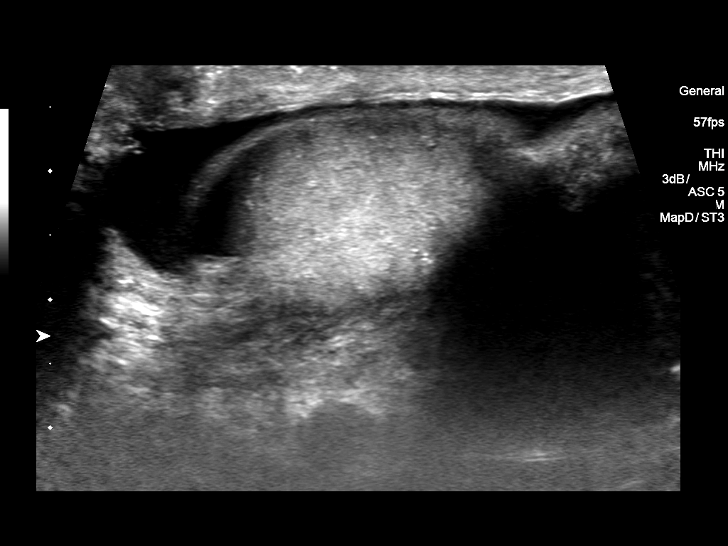
[im 24/72]
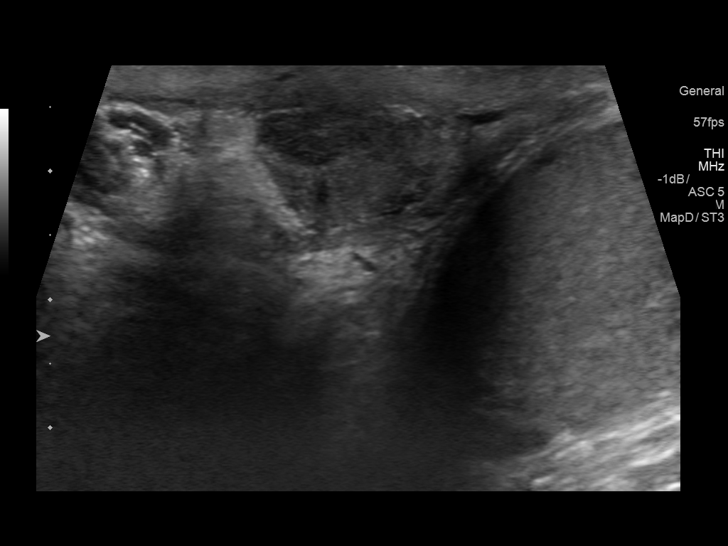
[im 27/72]
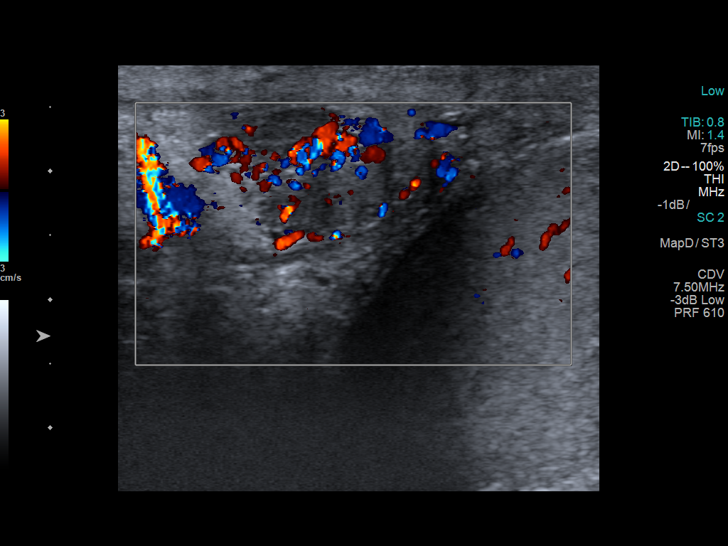
[im 33/72]
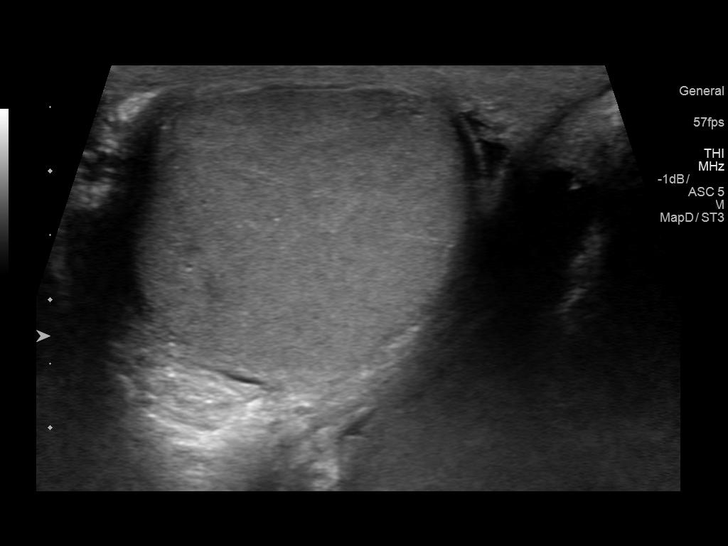
[im 39/72]
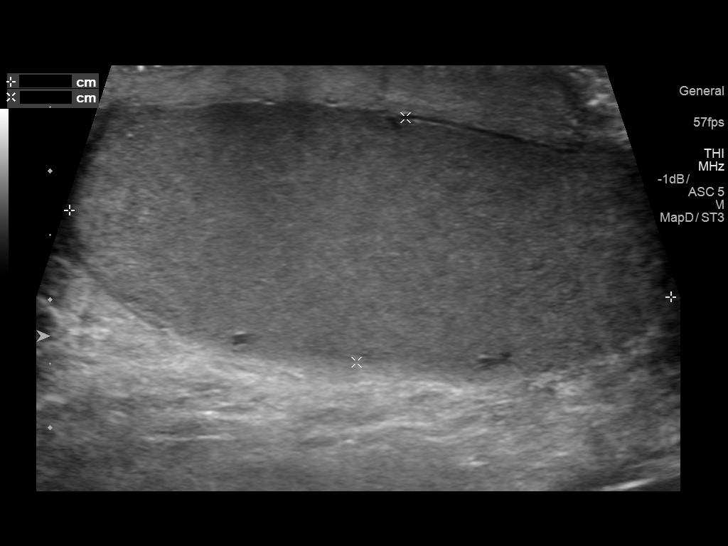
[im 45/72]
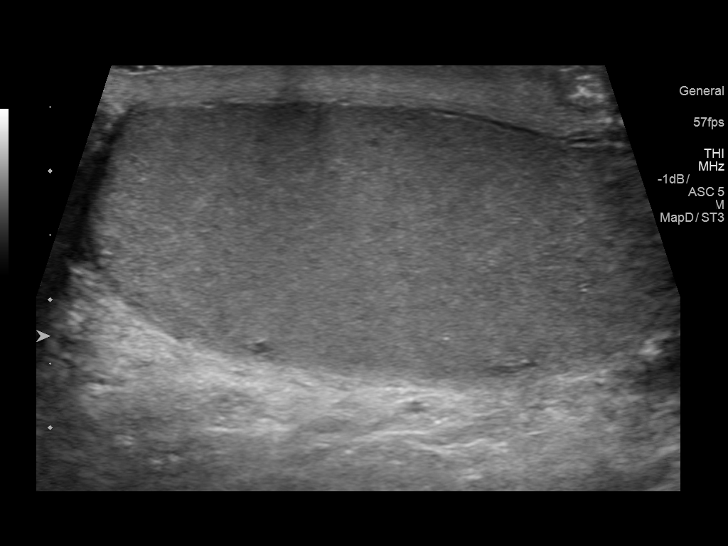
[im 48/72]
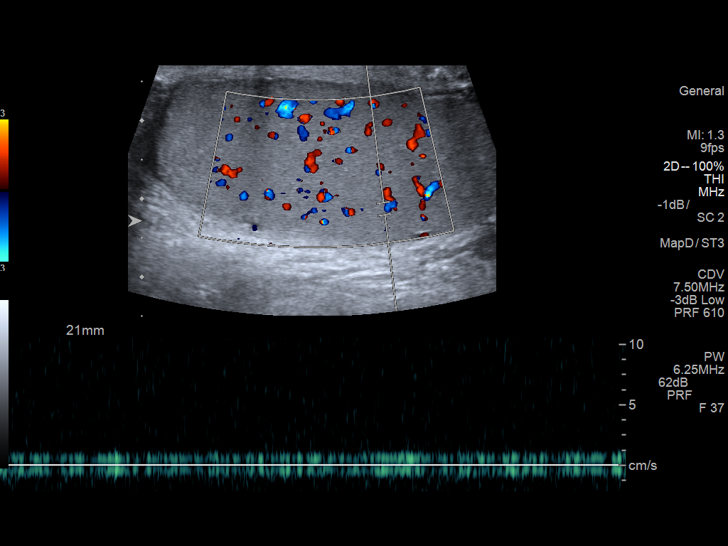
[im 54/72]
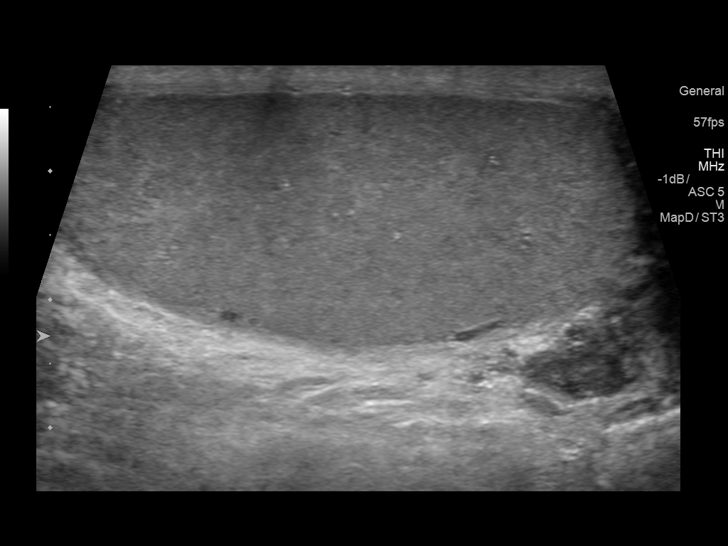
[im 60/72]
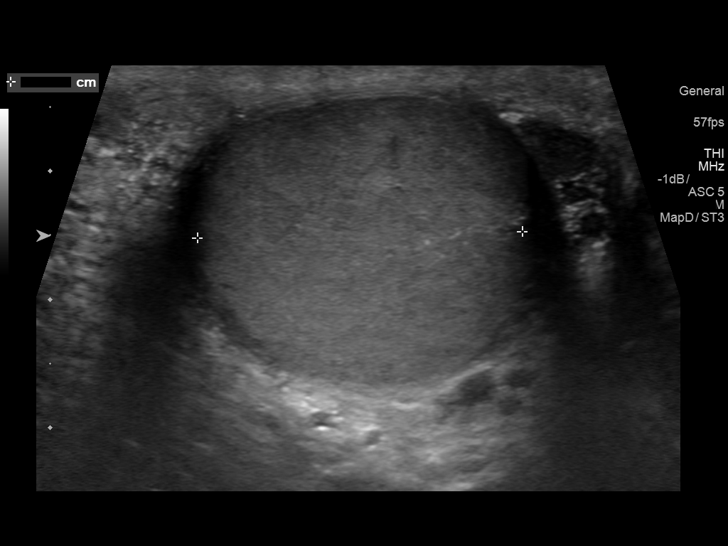
[im 66/72]
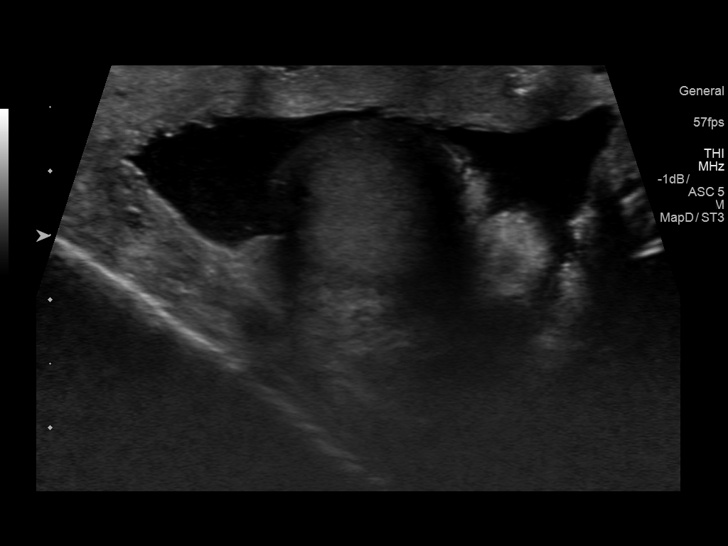
[im 72/72]
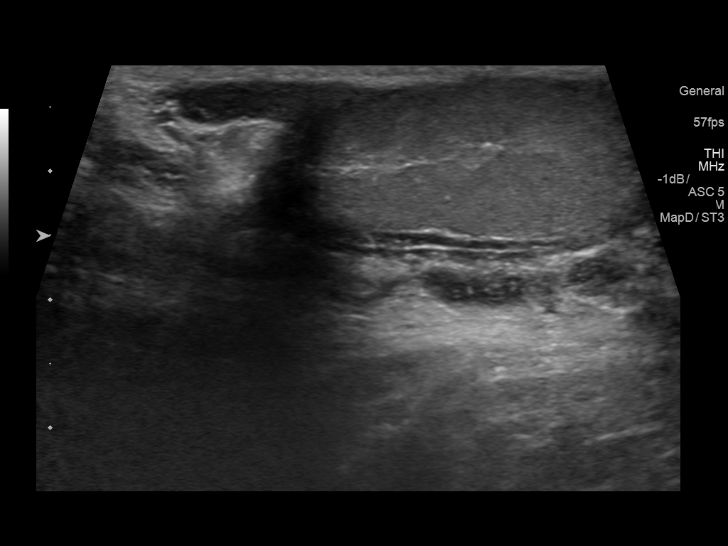

[14 of 25 positions shown; findings below may reference images not displayed]

FINDINGS: Right testicle

Measurements: 4.8 x 2.3 x 2.9 cm. No mass or microlithiasis
visualized.

Left testicle

Measurements: 4.7 x 1.9 x 2.5 cm. No mass or microlithiasis
visualized.

Right epididymis:  Normal in size and appearance.

Left epididymis:  Normal in size and appearance.

Hydrocele: Small bilateral hydroceles. Decreased on the right side.

Varicocele:  Small left varicocele

Pulsed Doppler interrogation of both testes demonstrates normal low
resistance arterial and venous waveforms bilaterally.
IMPRESSION: 1. Negative for acute testicular torsion
2. Small bilateral hydroceles, decreased on the right side
3. Small left varicocele

## 2016-06-05 ENCOUNTER — Emergency Department (HOSPITAL_COMMUNITY)
Admission: EM | Admit: 2016-06-05 | Discharge: 2016-06-05 | Disposition: A | Discharge status: Home / Self Care | Payer: 59 | Attending: Emergency Medicine | Admitting: Emergency Medicine

## 2016-06-05 ENCOUNTER — Encounter (HOSPITAL_COMMUNITY): Payer: Self-pay | Admitting: Emergency Medicine

## 2016-06-05 DIAGNOSIS — R361 Hematospermia: Secondary | ICD-10-CM | POA: Insufficient documentation

## 2016-06-05 DIAGNOSIS — I252 Old myocardial infarction: Secondary | ICD-10-CM | POA: Insufficient documentation

## 2016-06-05 DIAGNOSIS — Z79899 Other long term (current) drug therapy: Secondary | ICD-10-CM | POA: Diagnosis not present

## 2016-06-05 DIAGNOSIS — F17210 Nicotine dependence, cigarettes, uncomplicated: Secondary | ICD-10-CM | POA: Diagnosis not present

## 2016-06-05 LAB — URINALYSIS, ROUTINE W REFLEX MICROSCOPIC
BILIRUBIN URINE: NEGATIVE
GLUCOSE, UA: NEGATIVE mg/dL
Hgb urine dipstick: NEGATIVE
KETONES UR: NEGATIVE mg/dL
Nitrite: NEGATIVE
PROTEIN: NEGATIVE mg/dL
Specific Gravity, Urine: 1.024 (ref 1.005–1.030)
pH: 5 (ref 5.0–8.0)

## 2016-06-05 NOTE — ED Provider Notes (Signed)
[facility]-EMER[facility]Y DEPT Provider Note   CSN: [id number] Arrival date & time: 06/05/16  1507     History   Chief Complaint Chief Complaint  Patient presents with  . blood when ejaculated    HPI Kyle Silva is a 36 y.o. male.  Patient's a 36 year old male with a history of diverticulitis and angina who presents with bloody ejaculate. He states that he was having intercourse today and that he was ejaculating he noticed some blood mixed with the semen. He states that it never happened to him before. He denies any pain. No abdominal pain. No testicular pain. No pain with bowel movements. He denies any penile discharge. He states he urinated normally after the event and there is no noticeable blood in the urine. He denies any fevers.      Past Medical History:  Diagnosis Date  . Anginal pain (HCC) 07/13/2014   "mild"  . Diverticulitis   . Family history of adverse reaction to anesthesia    "dad had allergic reaction to anesthesia"   . GERD (gastroesophageal reflux disease)   . MI (mitral incompetence)     Patient Active Problem List   Diagnosis Date Noted  . Acute diverticulitis 08/30/2015  . Current smoker 07/13/2014  . Family history of premature CAD 07/13/2014  . Obese 07/13/2014  . Unstable angina (HCC) 07/13/2014  . Chest pain     Past Surgical History:  Procedure Laterality Date  . CARDIAC CATHETERIZATION N/A 07/13/2014   Procedure: Left Heart Cath and Coronary Angiography;  Surgeon: Marykay Lexavid W Harding, MD;  [facility];  Service: Cardiovascular;  Laterality: N/A;  . CYSTECTOMY Left ~ 2014   "wrist"  . CYSTECTOMY Left ~ 2014   "2 on my foot""       Home Medications    Prior to Admission medications   Medication Sig Start Date End Date Taking? Authorizing Provider  ciprofloxacin (CIPRO) 500 MG tablet Take 1 tablet (500 mg total) by mouth 2 (two) times daily. Patient not taking: Reported on 02/08/2016 08/31/15   Leatha GildingGherghe, Costin M, MD  diazepam  (VALIUM) 5 MG tablet Take 1 tablet (5 mg total) by mouth 2 (two) times daily. Patient not taking: Reported on 02/08/2016 12/08/15   Derwood KaplanNanavati, Ankit, MD  metroNIDAZOLE (FLAGYL) 500 MG tablet Take 1 tablet (500 mg total) by mouth 3 (three) times daily. Patient not taking: Reported on 02/08/2016 08/31/15   Leatha GildingGherghe, Costin M, MD  naproxen (NAPROSYN) 500 MG tablet Take 1 tablet (500 mg total) by mouth 2 (two) times daily with a meal. Patient not taking: Reported on 02/08/2016 12/08/15   Derwood KaplanNanavati, Ankit, MD  omeprazole (PRILOSEC) 20 MG capsule Take 20 mg by mouth daily as needed (heartburn/acid reflux).    [provider]  pantoprazole (PROTONIX) 40 MG tablet Take 1 tablet (40 mg total) by mouth daily. Patient not taking: Reported on 08/30/2015 07/14/14   Azalee CourseMeng, Hao, PA  polyethylene glycol [facility](MIRALAX) packet Take 17 g by mouth daily. Patient not taking: Reported on 02/08/2016 08/30/15   Benjiman CorePickering, Nathan, MD  promethazine (PHENERGAN) 25 MG tablet Take 1 tablet (25 mg total) by mouth every 6 (six) hours as needed for nausea or vomiting. Patient not taking: Reported on 02/08/2016 08/30/15   Benjiman CorePickering, Nathan, MD  traMADol (ULTRAM) 50 MG tablet Take 0.5-1 tablets (25-50 mg total) by mouth every 6 (six) hours as needed. Patient not taking: Reported on 02/08/2016 08/31/15   Leatha GildingGherghe, Costin M, MD    Family History Family History  Problem Relation Age of Onset  . AAA (abdominal aortic aneurysm) Mother   . Heart failure Father     Social History Social History  Substance Use Topics  . Smoking status: Current Every Day Smoker    Packs/day: 0.50    Years: 0.00    Types: Cigarettes  . Smokeless tobacco: Never Used  . Alcohol use No     Allergies   Penicillins   Review of Systems Review of Systems  Constitutional: Negative for chills, diaphoresis, fatigue and fever.  HENT: Negative for congestion, rhinorrhea and sneezing.   Eyes: Negative.   Respiratory: Negative for cough, chest tightness and  shortness of breath.   Cardiovascular: Negative for chest pain and leg swelling.  Gastrointestinal: Negative for abdominal pain, blood in stool, diarrhea, nausea and vomiting.  Genitourinary: Negative for difficulty urinating, discharge, flank pain, frequency, hematuria and testicular pain.  Musculoskeletal: Negative for arthralgias and back pain.  Skin: Negative for rash.  Neurological: Negative for dizziness, speech difficulty, weakness, numbness and headaches.     Physical Exam Updated Vital Signs BP (!) 143/81 (BP Location: Right Arm)   Pulse 94   Temp 98.6 F (37 C) (Oral)   Resp 18   Ht 5\' 10"  (1.778 m)   Wt 117.9 kg (260 lb)   SpO2 99%   BMI 37.31 kg/m   Physical Exam  Constitutional: He is oriented to person, place, and time. He appears well-developed and well-nourished.  HENT:  Head: Normocephalic and atraumatic.  Eyes: Pupils are equal, round, and reactive to light.  Neck: Normal range of motion. Neck supple.  Cardiovascular: Normal rate, regular rhythm and normal heart sounds.   Pulmonary/Chest: Effort normal and breath sounds normal. No respiratory distress. He has no wheezes. He has no rales. He exhibits no tenderness.  Abdominal: Soft. Bowel sounds are normal. There is no tenderness. There is no rebound and no guarding.  Genitourinary: Prostate normal and penis normal.  Genitourinary Comments: Normal circumcised male genitalia. There is no visible urethral discharge. No testicular tenderness or swelling. No rash or lesions are noted. There is no enlargement of the prostate or tenderness of the prostate on exam.  Musculoskeletal: Normal range of motion. He exhibits no edema.  Lymphadenopathy:    He has no cervical adenopathy.  Neurological: He is alert and oriented to person, place, and time.  Skin: Skin is warm and dry. No rash noted.  Psychiatric: He has a normal mood and affect.     ED Treatments / Results  Labs (all labs ordered are listed, but only  abnormal results are displayed) Labs Reviewed  URINALYSIS, ROUTINE W REFLEX MICROSCOPIC - Abnormal; Notable for the following:       Result Value   APPearance HAZY (*)    Leukocytes, UA MODERATE (*)    Bacteria, UA MANY (*)    Squamous Epithelial / LPF 0-5 (*)    All other components within normal limits  URINE CULTURE  GC/CHLAMYDIA PROBE AMP ([place]) NOT AT [facility]    EKG  EKG Interpretation None       Radiology No results found.  Procedures Procedures (including critical care time)  Medications Ordered in ED Medications - No data to display   Initial Impression / Assessment and Plan / ED Course  I have reviewed the triage vital signs and the nursing notes.  Pertinent labs & imaging results that were available during my care of the patient were reviewed by me and considered in my medical decision making (  see chart for details).     Patient presents with blood in his ejaculate. This happened one time. He denies any other symptoms. There is no suggestions of prostatitis. He doesn't have any symptoms of urethritis. There is no symptoms of a UTI. There is no abdominal pain. His urinalysis has positive leukocytes and bacteria. However he is not symptomatic. I will send it for culture. GC chlamydia was also sent but he was not currently treated given that he doesn't have any symptoms. I did advise him this likely will resolve on its own but he should follow-up with a urologist if the symptoms continue. He was given a referral to urology. Return precautions were given.  Final Clinical Impressions(s) / ED Diagnoses   Final diagnoses:  Hemospermia    New Prescriptions New Prescriptions   No medications on file     Rolan Bucco, MD 06/05/16 [phone]

## 2016-06-05 NOTE — ED Triage Notes (Signed)
Patient reports that while having sex earlier when he ejaculated he noticed blood.  Patient denies any pain or any other blood since. Patient denies urinary problems.

## 2016-06-06 LAB — GC/CHLAMYDIA PROBE AMP (~~LOC~~) NOT AT ARMC
Chlamydia: NEGATIVE
NEISSERIA GONORRHEA: NEGATIVE

## 2016-06-07 LAB — URINE CULTURE

## 2016-06-08 ENCOUNTER — Telehealth: Payer: Self-pay | Admitting: *Deleted

## 2016-06-08 NOTE — Telephone Encounter (Signed)
Post ED Visit - Positive Culture Follow-up  Culture report reviewed by antimicrobial stewardship pharmacist:  []  Enzo BiNathan [name], Pharm.D. []  Celedonio MiyamotoJeremy Frens, Pharm.D., BCPS AQ-ID []  Garvin FilaMike Maccia, Pharm.D., BCPS []  Georgina PillionElizabeth Martin, Pharm.D., BCPS []  LostineMinh Pham, [address]Pharm.D., BCPS, AAHIVP []  Estella HuskMichelle Turner, Pharm.D., BCPS, AAHIVP []  Lysle Pearlachel Rumbarger, PharmD, BCPS []  Casilda Carlsaylor Stone, PharmD, BCPS []  Pollyann SamplesAndy Johnston, PharmD, BCPS  Positive urine culture reviewed by Lyndel SafeElizabeth Hammond, PA-C No treatment necessary  Lysle PearlRobertson, Hayze Gazda Talley 06/08/2016, 11:06 AM

## 2016-06-08 NOTE — Progress Notes (Signed)
ED Antimicrobial Stewardship Positive Culture Follow Up   Kyle Silva is an 36 y.o. male who [facility] with a chief complaint of  Chief Complaint  Patient presents with  . blood when ejaculated    Recent Results (from the past 720 hour(s))  Urine culture     Status: Abnormal   Collection Time: 06/05/16  4:21 PM  Result Value Ref Range Status   Specimen Description URINE, CLEAN CATCH  Final   Special Requests NONE  Final   Culture >=100,000 COLONIES/mL ESCHERICHIA COLI (A)  Final   Report Status 06/07/2016 FINAL  Final   Organism ID, Bacteria ESCHERICHIA COLI (A)  Final      Susceptibility   Escherichia coli - MIC*    AMPICILLIN >=32 RESISTANT Resistant     CEFAZOLIN >=64 RESISTANT Resistant     CEFTRIAXONE <=1 SENSITIVE Sensitive     CIPROFLOXACIN <=0.25 SENSITIVE Sensitive     GENTAMICIN <=1 SENSITIVE Sensitive     IMIPENEM <=0.25 SENSITIVE Sensitive     NITROFURANTOIN <=16 SENSITIVE Sensitive     TRIMETH/SULFA <=20 SENSITIVE Sensitive     AMPICILLIN/SULBACTAM 16 INTERMEDIATE Intermediate     PIP/TAZO <=4 SENSITIVE Sensitive     Extended ESBL NEGATIVE Sensitive     * >=100,000 COLONIES/mL ESCHERICHIA COLI    No UTI symptoms, consider asymptomatic bacteruria. No treatment necessary.  ED Provider: Lyndel SafeElizabeth Hammond PA-C   Armandina StammerBATCHELDER,Kyle Silva 06/08/2016, 9:17 AM Infectious Diseases Pharmacist Phone# [phone]

## 2016-07-04 ENCOUNTER — Encounter (HOSPITAL_COMMUNITY): Payer: Self-pay | Admitting: *Deleted

## 2016-07-04 ENCOUNTER — Emergency Department (HOSPITAL_COMMUNITY)
Admission: EM | Admit: 2016-07-04 | Discharge: 2016-07-04 | Disposition: A | Discharge status: Home / Self Care | Payer: 59 | Attending: Emergency Medicine | Admitting: Emergency Medicine

## 2016-07-04 ENCOUNTER — Emergency Department (HOSPITAL_COMMUNITY): Payer: 59

## 2016-07-04 DIAGNOSIS — N50811 Right testicular pain: Secondary | ICD-10-CM | POA: Diagnosis present

## 2016-07-04 DIAGNOSIS — F17210 Nicotine dependence, cigarettes, uncomplicated: Secondary | ICD-10-CM | POA: Diagnosis not present

## 2016-07-04 DIAGNOSIS — N453 Epididymo-orchitis: Secondary | ICD-10-CM | POA: Diagnosis not present

## 2016-07-04 HISTORY — DX: Epididymitis: N45.1

## 2016-07-04 LAB — URINALYSIS, ROUTINE W REFLEX MICROSCOPIC
Bilirubin Urine: NEGATIVE
Glucose, UA: NEGATIVE mg/dL
Ketones, ur: NEGATIVE mg/dL
Nitrite: POSITIVE — AB
Protein, ur: 100 mg/dL — AB
SPECIFIC GRAVITY, URINE: 1.027 (ref 1.005–1.030)
SQUAMOUS EPITHELIAL / LPF: NONE SEEN
pH: 5 (ref 5.0–8.0)

## 2016-07-04 MED ORDER — SULFAMETHOXAZOLE-TRIMETHOPRIM 800-160 MG PO TABS
1.0000 | ORAL_TABLET | Freq: Once | ORAL | Status: AC
  Administered 2016-07-04: 1 via ORAL
  Filled 2016-07-04: qty 1

## 2016-07-04 MED ORDER — DOXYCYCLINE HYCLATE 100 MG PO TABS
100.0000 mg | ORAL_TABLET | Freq: Two times a day (BID) | ORAL | Status: DC
  Administered 2016-07-04: 100 mg via ORAL
  Filled 2016-07-04: qty 1

## 2016-07-04 MED ORDER — OXYCODONE-ACETAMINOPHEN 5-325 MG PO TABS
1.0000 | ORAL_TABLET | Freq: Once | ORAL | Status: AC
  Administered 2016-07-04: 1 via ORAL
  Filled 2016-07-04: qty 1

## 2016-07-04 MED ORDER — OXYCODONE-ACETAMINOPHEN 5-325 MG PO TABS: 1.0000 | ORAL_TABLET | ORAL | 0 refills | Status: DC | PRN

## 2016-07-04 MED ORDER — SULFAMETHOXAZOLE-TRIMETHOPRIM 800-160 MG PO TABS: 1.0000 | ORAL_TABLET | Freq: Two times a day (BID) | ORAL | 0 refills | Status: AC

## 2016-07-04 MED ORDER — DOXYCYCLINE HYCLATE 100 MG PO CAPS: 100.0000 mg | ORAL_CAPSULE | Freq: Two times a day (BID) | ORAL | 0 refills | Status: DC

## 2016-07-04 NOTE — ED Provider Notes (Signed)
[facility]ER[facility] DEPT Provider Note   CSN: [id number] Arrival date & time: 07/04/16  1309  By signing my name below, I, Kyle Silva, attest that this documentation has been prepared under the direction and in the presence of [facility] Medical Center[facility]ime Asah Lamay, PA-C. Electronically Signed: Linna Darnerussell Silva, Scribe. 07/04/2016. 3:22 PM.  History   Chief Complaint Chief Complaint  Patient presents with  . Groin Swelling   The history is provided by the patient. No language interpreter was used.    HPI Comments: Kyle Silva is a 36 y.o. male with PMHx including epididymitis who presents to the Emergency Department complaining of constant right testicular pain and swelling for four days. Patient also notes that his right testicle has intermittently appeared red over this time span. He endorses pain exacerbation with palpation and ambulation. There are no alleviating factors noted. He was evaluated here on 5/29 for blood in his ejaculate without any testicular pain or swelling and was discharged without antibiotics. He is concerned these are related and he may now need ABX for infection. The GC/Chlamydia probe submitted during the visit on 5/29 was negative, but the urine urinalysis showed leuks and bacteria.  Patient reports some recent unprotected sexual intercourse and also indicates that he has sex frequently. He has a listed allergy to penicillins. Patient denies penile discharge, dysuria, urinary urgency/frequency abdominal pain, fevers, chills, or any other associated symptoms.  Past Medical History:  Diagnosis Date  . Anginal pain (HCC) 07/13/2014   "mild"  . Diverticulitis   . Epididymitis   . Family history of adverse reaction to anesthesia    "dad had allergic reaction to anesthesia"   . GERD (gastroesophageal reflux disease)   . MI (mitral incompetence)     Patient Active Problem List   Diagnosis Date Noted  . Acute diverticulitis 08/30/2015  . Current smoker 07/13/2014  . Family history of premature  CAD 07/13/2014  . Obese 07/13/2014  . Unstable angina (HCC) 07/13/2014  . Chest pain     Past Surgical History:  Procedure Laterality Date  . CARDIAC CATHETERIZATION N/A 07/13/2014   Procedure: Left Heart Cath and Coronary Angiography;  Surgeon: Marykay Lexavid W Harding, MD;  [facility];  Service: Cardiovascular;  Laterality: N/A;  . CYSTECTOMY Left ~ 2014   "wrist"  . CYSTECTOMY Left ~ 2014   "2 on my foot""       Home Medications    Prior to Admission medications   Medication Sig Start Date End Date Taking? Authorizing Provider  ciprofloxacin (CIPRO) 500 MG tablet Take 1 tablet (500 mg total) by mouth 2 (two) times daily. Patient not taking: Reported on 02/08/2016 08/31/15   Leatha GildingGherghe, Costin M, MD  diazepam (VALIUM) 5 MG tablet Take 1 tablet (5 mg total) by mouth 2 (two) times daily. Patient not taking: Reported on 02/08/2016 12/08/15   Derwood KaplanNanavati, Ankit, MD  doxycycline (VIBRAMYCIN) 100 MG capsule Take 1 capsule (100 mg total) by mouth 2 (two) times daily. 07/04/16   Carey Johndrow, Chase PicketJaime Pilcher, PA-C  metroNIDAZOLE (FLAGYL) 500 MG tablet Take 1 tablet (500 mg total) by mouth 3 (three) times daily. Patient not taking: Reported on 02/08/2016 08/31/15   Leatha GildingGherghe, Costin M, MD  naproxen (NAPROSYN) 500 MG tablet Take 1 tablet (500 mg total) by mouth 2 (two) times daily with a meal. Patient not taking: Reported on 02/08/2016 12/08/15   Derwood KaplanNanavati, Ankit, MD  omeprazole (PRILOSEC) 20 MG capsule Take 20 mg by mouth daily as needed (heartburn/acid reflux).    [provider]  oxyCODONE-acetaminophen (PERCOCET/ROXICET) 5-325 MG tablet Take 1 tablet by mouth every 4 (four) hours as needed for severe pain. 07/04/16   Shirley Decamp, Chase Picket, PA-C  pantoprazole (PROTONIX) 40 MG tablet Take 1 tablet (40 mg total) by mouth daily. Patient not taking: Reported on 08/30/2015 07/14/14   Azalee Course, PA  polyethylene glycol [facility]) packet Take 17 g by mouth daily. Patient not taking: Reported on 02/08/2016  08/30/15   Benjiman Core, MD  promethazine (PHENERGAN) 25 MG tablet Take 1 tablet (25 mg total) by mouth every 6 (six) hours as needed for nausea or vomiting. Patient not taking: Reported on 02/08/2016 08/30/15   Benjiman Core, MD  sulfamethoxazole-trimethoprim (BACTRIM DS,SEPTRA DS) 800-160 MG tablet Take 1 tablet by mouth 2 (two) times daily. 07/04/16 07/11/16  Anasia Agro, Chase Picket, PA-C  traMADol (ULTRAM) 50 MG tablet Take 0.5-1 tablets (25-50 mg total) by mouth every 6 (six) hours as needed. Patient not taking: Reported on 02/08/2016 08/31/15   Leatha Gilding, MD    Family History Family History  Problem Relation Age of Onset  . AAA (abdominal aortic aneurysm) Mother   . Heart failure Father     Social History Social History  Substance Use Topics  . Smoking status: Current Every Day Smoker    Packs/day: 0.50    Years: 0.00    Types: Cigarettes  . Smokeless tobacco: Never Used  . Alcohol use No     Allergies   Penicillins   Review of Systems Review of Systems  Constitutional: Negative for chills and fever.  Gastrointestinal: Negative for abdominal pain.  Genitourinary: Positive for scrotal swelling (R) and testicular pain (R). Negative for discharge.  All other systems reviewed and are negative.  Physical Exam Updated Vital Signs BP 113/79   Pulse 68   Temp 99.3 F (37.4 C) (Oral)   Resp 16   Ht 5\' 10"  (1.778 m)   Wt 111.5 kg (245 lb 14.4 oz)   SpO2 98%   BMI 35.28 kg/m   Physical Exam  Constitutional: He is oriented to person, place, and time. He appears well-developed and well-nourished. No distress.  HENT:  Head: Normocephalic and atraumatic.  Cardiovascular: Normal rate, regular rhythm and normal heart sounds.   No murmur heard. Pulmonary/Chest: Effort normal and breath sounds normal. No respiratory distress.  Abdominal: Soft. Bowel sounds are normal. He exhibits no distension.  No abdominal or CVA tenderness.  Genitourinary:  Genitourinary  Comments: Chaperone present for exam. + tenderness to right testicle and mild swelling. No signs of lesion or erythema on the penis or testicles. No penile discharge appreciated. No signs of any inguinal hernias.  Musculoskeletal: He exhibits no edema.  Neurological: He is alert and oriented to person, place, and time.  Skin: Skin is warm and dry.  Nursing note and vitals reviewed.  ED Treatments / Results  Labs (all labs ordered are listed, but only abnormal results are displayed) Labs Reviewed  URINALYSIS, ROUTINE W REFLEX MICROSCOPIC - Abnormal; Notable for the following:       Result Value   APPearance HAZY (*)    Hgb urine dipstick SMALL (*)    Protein, ur 100 (*)    Nitrite POSITIVE (*)    Leukocytes, UA MODERATE (*)    Bacteria, UA MANY (*)    All other components within normal limits  GC/CHLAMYDIA PROBE AMP ([place]) NOT AT [facility]    EKG  EKG Interpretation None       Radiology US Scrotum  Result Date:  07/04/2016 CLINICAL DATA:  Initial evaluation for acute right testicular pain. EXAM: SCROTAL ULTRASOUND DOPPLER ULTRASOUND OF THE TESTICLES TECHNIQUE: Complete ultrasound examination of the testicles, epididymis, and other scrotal structures was performed. Color and spectral Doppler ultrasound were also utilized to evaluate blood flow to the testicles. COMPARISON:  None. FINDINGS: Right testicle Measurements: 4.6 x 2.4 x 2.9 cm. No mass or microlithiasis visualized. Mildly increased vascularity as compared to the left testicle. Left testicle Measurements: 3.9 x 2.7 x 2.6 cm. No mass or microlithiasis visualized. Right epididymis: Increased prominence of the epididymal head and tail with heterogeneous appearance and increased vascularity, suggesting acute epididymitis. Left epididymis:  Normal in size and appearance. Hydrocele:  Bilateral hydroceles, right greater than left. Varicocele:  Left-sided varicocele. Pulsed Doppler interrogation of both testes demonstrates normal low  resistance arterial and venous waveforms bilaterally. IMPRESSION: 1. Increased prominence of the right epididymal head and tail with heterogeneous echotexture and increased vascularity, with associated increased vascularity within the right testis. Findings suggestive of acute right-sided epididymo-orchitis. 2. Bilateral hydroceles, right greater than left, likely reactive. 3. Small left varicocele. 4. No evidence for testicular torsion. Electronically Signed   By: Rise Mu M.D.   On: 07/04/2016 16:15   [place] Art/ven Flow Abd Pelv Doppler  Result Date: 07/04/2016 CLINICAL DATA:  Initial evaluation for acute right testicular pain. EXAM: SCROTAL ULTRASOUND DOPPLER ULTRASOUND OF THE TESTICLES TECHNIQUE: Complete ultrasound examination of the testicles, epididymis, and other scrotal structures was performed. Color and spectral Doppler ultrasound were also utilized to evaluate blood flow to the testicles. COMPARISON:  None. FINDINGS: Right testicle Measurements: 4.6 x 2.4 x 2.9 cm. No mass or microlithiasis visualized. Mildly increased vascularity as compared to the left testicle. Left testicle Measurements: 3.9 x 2.7 x 2.6 cm. No mass or microlithiasis visualized. Right epididymis: Increased prominence of the epididymal head and tail with heterogeneous appearance and increased vascularity, suggesting acute epididymitis. Left epididymis:  Normal in size and appearance. Hydrocele:  Bilateral hydroceles, right greater than left. Varicocele:  Left-sided varicocele. Pulsed Doppler interrogation of both testes demonstrates normal low resistance arterial and venous waveforms bilaterally. IMPRESSION: 1. Increased prominence of the right epididymal head and tail with heterogeneous echotexture and increased vascularity, with associated increased vascularity within the right testis. Findings suggestive of acute right-sided epididymo-orchitis. 2. Bilateral hydroceles, right greater than left, likely reactive. 3. Small  left varicocele. 4. No evidence for testicular torsion. Electronically Signed   By: Rise Mu M.D.   On: 07/04/2016 16:15    Procedures Procedures (including critical care time)  DIAGNOSTIC STUDIES: Oxygen Saturation is 100% on RA, normal by my interpretation.    COORDINATION OF CARE: 3:09 PM Discussed treatment plan with pt at bedside and pt agreed to plan.  Medications Ordered in ED Medications  doxycycline (VIBRA-TABS) tablet 100 mg (100 mg Oral Given 07/04/16 1645)  oxyCODONE-acetaminophen (PERCOCET/ROXICET) 5-325 MG per tablet 1 tablet (1 tablet Oral Given 07/04/16 1519)  sulfamethoxazole-trimethoprim (BACTRIM DS,SEPTRA DS) 800-160 MG per tablet 1 tablet (1 tablet Oral Given 07/04/16 1644)     Initial Impression / Assessment and Plan / ED Course  I have reviewed the triage vital signs and the nursing notes.  Pertinent labs & imaging results that were available during my care of the patient were reviewed by me and considered in my medical decision making (see chart for details).    Caine Barfield is a 36 y.o. male who presents to ED for right testicular pain. On exam, patient with tender and slightly swollen  right testicle. Afebrile, hemodynamically stable with no abdominal or CVA tenderness. Ultrasound obtained:  IMPRESSION: 1. Increased prominence of the right epididymal head and tail with heterogeneous echotexture and increased vascularity, with associated increased vascularity within the right testis. Findings suggestive of acute right-sided epididymo-orchitis. 2. Bilateral hydroceles, right greater than left, likely reactive. 3. Small left varicocele. 4. No evidence for testicular torsion.  UA obtained showing nitrite positive, moderate leuks, TNTC WBC's and many bacteria. On 5/29, patient had urine culture which was + for > 100,00 colonies of e.coli. Consulted pharmacy for ABX recommendations to cover both epididymo-orchitis as well as e.coli for urine. They  recommend doxycyline as well as bactrim for ABX coverage. Rx for these given. Patient given referral to urology as well. Spoke with him about return precautions, home care and follow up instructions. All questions answered.    Final Clinical Impressions(s) / ED Diagnoses   Final diagnoses:  Epididymoorchitis    New Prescriptions Discharge Medication List as of 07/04/2016  5:03 PM    START taking these medications   Details  doxycycline (VIBRAMYCIN) 100 MG capsule Take 1 capsule (100 mg total) by mouth 2 (two) times daily., Starting Wed 07/04/2016, Print    sulfamethoxazole-trimethoprim (BACTRIM DS,SEPTRA DS) 800-160 MG tablet Take 1 tablet by mouth 2 (two) times daily., Starting Wed 07/04/2016, Until Wed 07/11/2016, Print       I personally performed the services described in this documentation, which was scribed in my presence. The recorded information has been reviewed and is accurate.    Shadie Sweatman, Chase Picket, PA-C 07/04/16 1742    Mesner, Barbara Cower, MD 07/05/16 2155

## 2016-07-04 NOTE — ED Notes (Signed)
Patient was educated not to drive, operate heavy machinery, or drink alcohol while taking narcotic medication.  

## 2016-07-04 NOTE — Discharge Instructions (Signed)
It was my pleasure taking care of you today!   Please take all of your antibiotics until finished!  If your symptoms are not improving over the next 2-3 days, please call the urologist to schedule a follow up appointment.   Return to ER for new or worsening symptoms, any additional concerns.

## 2016-07-04 NOTE — ED Triage Notes (Signed)
Patient is alert and oriented x4.  He is being seen for swollen groin.  Patient has been seen in this ED for epididymitis 4 weeks ago and states that he wasn't given any medication to help the infection and it has not gone away.  Currently he rates his pain 9 of 10

## 2016-07-05 LAB — GC/CHLAMYDIA PROBE AMP (~~LOC~~) NOT AT ARMC
CHLAMYDIA, DNA PROBE: NEGATIVE
NEISSERIA GONORRHEA: NEGATIVE

## 2016-09-07 ENCOUNTER — Emergency Department (HOSPITAL_COMMUNITY)
Admission: EM | Admit: 2016-09-07 | Discharge: 2016-09-07 | Disposition: A | Discharge status: Home / Self Care | Payer: 59 | Attending: Emergency Medicine | Admitting: Emergency Medicine

## 2016-09-07 ENCOUNTER — Emergency Department (HOSPITAL_COMMUNITY): Payer: 59

## 2016-09-07 ENCOUNTER — Encounter (HOSPITAL_COMMUNITY): Payer: Self-pay | Admitting: Emergency Medicine

## 2016-09-07 DIAGNOSIS — F17210 Nicotine dependence, cigarettes, uncomplicated: Secondary | ICD-10-CM | POA: Diagnosis not present

## 2016-09-07 DIAGNOSIS — Z880 Allergy status to penicillin: Secondary | ICD-10-CM | POA: Insufficient documentation

## 2016-09-07 DIAGNOSIS — N50819 Testicular pain, unspecified: Secondary | ICD-10-CM

## 2016-09-07 DIAGNOSIS — Z79899 Other long term (current) drug therapy: Secondary | ICD-10-CM | POA: Diagnosis not present

## 2016-09-07 DIAGNOSIS — N451 Epididymitis: Secondary | ICD-10-CM | POA: Diagnosis not present

## 2016-09-07 MED ORDER — KETOROLAC TROMETHAMINE 60 MG/2ML IM SOLN
15.0000 mg | Freq: Once | INTRAMUSCULAR | Status: AC
  Administered 2016-09-07: 15 mg via INTRAMUSCULAR
  Filled 2016-09-07: qty 2

## 2016-09-07 MED ORDER — DOXYCYCLINE HYCLATE 100 MG PO CAPS: 100.0000 mg | ORAL_CAPSULE | Freq: Two times a day (BID) | ORAL | 0 refills | Status: DC

## 2016-09-07 MED ORDER — AZITHROMYCIN 250 MG PO TABS: 1000.0000 mg | ORAL_TABLET | Freq: Once | ORAL | Status: DC

## 2016-09-07 MED ORDER — GENTAMICIN SULFATE 40 MG/ML IJ SOLN
240.0000 mg | Freq: Once | INTRAMUSCULAR | Status: AC
  Administered 2016-09-07: 240 mg via INTRAMUSCULAR
  Filled 2016-09-07: qty 6

## 2016-09-07 MED ORDER — AZITHROMYCIN 250 MG PO TABS
2000.0000 mg | ORAL_TABLET | Freq: Once | ORAL | Status: AC
  Administered 2016-09-07: 2000 mg via ORAL
  Filled 2016-09-07: qty 8

## 2016-09-07 MED ORDER — ACETAMINOPHEN 500 MG PO TABS
1000.0000 mg | ORAL_TABLET | Freq: Once | ORAL | Status: AC
  Administered 2016-09-07: 1000 mg via ORAL
  Filled 2016-09-07: qty 2

## 2016-09-07 NOTE — Discharge Instructions (Signed)
Follow up with your PCP.  Follow up with urology for recurrent symptoms.  Take the antibiotics until finished

## 2016-09-07 NOTE — ED Notes (Signed)
Pharmacy notified gentamicin has not arrived. They will send it asap.

## 2016-09-07 NOTE — ED Triage Notes (Signed)
Pt c/o testicular pain onset 1900 yesterday, malodorous urine, blood in urine and semen. Feels similar to episode 1 month ago for which patient was diagnosed with epididymoorchitis, pt was prescribed antibiotics but did not finish course. No fevers, chills, nausea, emesis.

## 2016-09-07 NOTE — ED Provider Notes (Signed)
[facility]-EMERGENCY DEPT Provider Note   CSN: [id number] Arrival date & time: 09/07/16  1349     History   Chief Complaint Chief Complaint  Patient presents with  . Testicle Pain    HPI Kyle Silva is a 36 y.o. male.  36 yo M with a chief complaints of right-sided testicular pain. Going on since last night. Pain is mildly worsening. Denies nausea or vomiting. Denies trauma. Having pain with a standing and sitting down on the trailer.    The history is provided by the patient.  Testicle Pain  This is a recurrent problem. The current episode started yesterday. The problem occurs constantly. The problem has been gradually worsening. Pertinent negatives include no chest pain, no abdominal pain, no headaches and no shortness of breath. The symptoms are aggravated by bending and walking. Nothing relieves the symptoms. He has tried nothing for the symptoms. The treatment provided no relief.    Past Medical History:  Diagnosis Date  . Anginal pain (HCC) 07/13/2014   "mild"  . Diverticulitis   . Epididymitis   . Family history of adverse reaction to anesthesia    "dad had allergic reaction to anesthesia"   . GERD (gastroesophageal reflux disease)   . MI (mitral incompetence)     Patient Active Problem List   Diagnosis Date Noted  . Acute diverticulitis 08/30/2015  . Current smoker 07/13/2014  . Family history of premature CAD 07/13/2014  . Obese 07/13/2014  . Unstable angina (HCC) 07/13/2014  . Chest pain     Past Surgical History:  Procedure Laterality Date  . CARDIAC CATHETERIZATION N/A 07/13/2014   Procedure: Left Heart Cath and Coronary Angiography;  Surgeon: Marykay Lex, MD;  Location: [facility] INVASIVE CV LAB;  Service: Cardiovascular;  Laterality: N/A;  . CYSTECTOMY Left ~ 2014   "wrist"  . CYSTECTOMY Left ~ 2014   "2 on my foot""       Home Medications    Prior to Admission medications   Medication Sig Start Date End Date Taking? Authorizing Provider    ciprofloxacin (CIPRO) 500 MG tablet Take 1 tablet (500 mg total) by mouth 2 (two) times daily. Patient not taking: Reported on 02/08/2016 08/31/15   Leatha Gilding, MD  diazepam (VALIUM) 5 MG tablet Take 1 tablet (5 mg total) by mouth 2 (two) times daily. Patient not taking: Reported on 02/08/2016 12/08/15   Derwood Kaplan, MD  doxycycline (VIBRAMYCIN) 100 MG capsule Take 1 capsule (100 mg total) by mouth 2 (two) times daily. One po bid x 7 days 09/07/16   Melene Plan, DO  metroNIDAZOLE (FLAGYL) 500 MG tablet Take 1 tablet (500 mg total) by mouth 3 (three) times daily. Patient not taking: Reported on 02/08/2016 08/31/15   Leatha Gilding, MD  naproxen (NAPROSYN) 500 MG tablet Take 1 tablet (500 mg total) by mouth 2 (two) times daily with a meal. Patient not taking: Reported on 02/08/2016 12/08/15   Derwood Kaplan, MD  omeprazole (PRILOSEC) 20 MG capsule Take 20 mg by mouth daily as needed (heartburn/acid reflux).    [provider]  oxyCODONE-acetaminophen (PERCOCET/ROXICET) 5-325 MG tablet Take 1 tablet by mouth every 4 (four) hours as needed for severe pain. 07/04/16   Ward, Chase Picket, PA-C  pantoprazole (PROTONIX) 40 MG tablet Take 1 tablet (40 mg total) by mouth daily. Patient not taking: Reported on 08/30/2015 07/14/14   Azalee Course, PA  polyethylene glycol [facility]) packet Take 17 g by mouth daily. Patient not taking: Reported on 02/08/2016  08/30/15   Benjiman CorePickering, Nathan, MD  promethazine (PHENERGAN) 25 MG tablet Take 1 tablet (25 mg total) by mouth every 6 (six) hours as needed for nausea or vomiting. Patient not taking: Reported on 02/08/2016 08/30/15   Benjiman CorePickering, Nathan, MD  traMADol (ULTRAM) 50 MG tablet Take 0.5-1 tablets (25-50 mg total) by mouth every 6 (six) hours as needed. Patient not taking: Reported on 02/08/2016 08/31/15   Leatha GildingGherghe, Costin M, MD    Family History Family History  Problem Relation Age of Onset  . AAA (abdominal aortic aneurysm) Mother   . Heart failure Father      Social History Social History  Substance Use Topics  . Smoking status: Current Every Day Smoker    Packs/day: 0.50    Years: 0.00    Types: Cigarettes  . Smokeless tobacco: Never Used  . Alcohol use No     Allergies   Penicillins   Review of Systems Review of Systems  Constitutional: Negative for chills and fever.  HENT: Negative for congestion and facial swelling.   Eyes: Negative for discharge and visual disturbance.  Respiratory: Negative for shortness of breath.   Cardiovascular: Negative for chest pain and palpitations.  Gastrointestinal: Negative for abdominal pain, diarrhea and vomiting.  Genitourinary: Positive for testicular pain.  Musculoskeletal: Negative for arthralgias and myalgias.  Skin: Negative for color change and rash.  Neurological: Negative for tremors, syncope and headaches.  Psychiatric/Behavioral: Negative for confusion and dysphoric mood.     Physical Exam Updated Vital Signs BP 119/86 (BP Location: Left Arm)   Pulse 70   Temp 98.5 F (36.9 C) (Oral)   Resp 20   SpO2 100%   Physical Exam  Constitutional: He is oriented to person, place, and time. He appears well-developed and well-nourished.  HENT:  Head: Normocephalic and atraumatic.  Eyes: Pupils are equal, round, and reactive to light. EOM are normal.  Neck: Normal range of motion. Neck supple. No JVD present.  Cardiovascular: Normal rate and regular rhythm.  Exam reveals no gallop and no friction rub.   No murmur heard. Pulmonary/Chest: No respiratory distress. He has no wheezes.  Abdominal: He exhibits no distension and no mass. There is no tenderness. There is no rebound and no guarding.  Genitourinary: Penis normal. Right testis shows tenderness. Right testis shows no mass and no swelling. Right testis is descended. Left testis shows no mass, no swelling and no tenderness. Left testis is descended.  Genitourinary Comments: Right testicle is tender to palpation. Worst about the  posterior pole.  Musculoskeletal: Normal range of motion.  Neurological: He is alert and oriented to person, place, and time.  Skin: No rash noted. No pallor.  Psychiatric: He has a normal mood and affect. His behavior is normal.  Nursing note and vitals reviewed.    ED Treatments / Results  Labs (all labs ordered are listed, but only abnormal results are displayed) Labs Reviewed - No data to display  EKG  EKG Interpretation None       Radiology [place] Scrotum  Result Date: 09/07/2016 CLINICAL DATA:  Right testicular pain EXAM: SCROTAL ULTRASOUND DOPPLER ULTRASOUND OF THE TESTICLES TECHNIQUE: Complete ultrasound examination of the testicles, epididymis, and other scrotal structures was performed. Color and spectral Doppler ultrasound were also utilized to evaluate blood flow to the testicles. COMPARISON:  07/04/2016 FINDINGS: Right testicle Measurements: 4.8 x 2.3 x 2.9 cm. No mass or microlithiasis visualized. Left testicle Measurements: 4.7 x 1.9 x 2.5 cm. No mass or microlithiasis visualized. Right epididymis:  Normal in size and appearance. Left epididymis:  Normal in size and appearance. Hydrocele: Small bilateral hydroceles. Decreased on the right side. Varicocele:  Small left varicocele Pulsed Doppler interrogation of both testes demonstrates normal low resistance arterial and venous waveforms bilaterally. IMPRESSION: 1. Negative for acute testicular torsion 2. Small bilateral hydroceles, decreased on the right side 3. Small left varicocele Electronically Signed   By: Jasmine Pang M.D.   On: 09/07/2016 18:23    Procedures Procedures (including critical care time)  Medications Ordered in ED Medications  gentamicin (GARAMYCIN) injection 240 mg (not administered)  azithromycin (ZITHROMAX) tablet 2,000 mg (not administered)  ketorolac (TORADOL) injection 15 mg (15 mg Intramuscular Given 09/07/16 1749)  acetaminophen (TYLENOL) tablet 1,000 mg (1,000 mg Oral Given 09/07/16 1749)      Initial Impression / Assessment and Plan / ED Course  I have reviewed the triage vital signs and the nursing notes.  Pertinent labs & imaging results that were available during my care of the patient were reviewed by me and considered in my medical decision making (see chart for details).     36 yo M With right-sided testicular pain. This recurrent issue for him. He had just been treated for was thought to be orchitis about a month ago. He did not take all of his antibiotic. Starting having symptoms again yesterday.  [place] negative for torsion.  Start on abx for epididymitis.  Urology follow up for recurrent symptoms.   6:41 PM:  I have discussed the diagnosis/risks/treatment options with the patient and believe the pt to be eligible for discharge home to follow-up with Urology. We also discussed returning to the ED immediately if new or worsening sx occur. We discussed the sx which are most concerning (e.g., sudden worsening pain, fever, inability to tolerate by mouth) that necessitate immediate return. Medications administered to the patient during their visit and any new prescriptions provided to the patient are listed below.  Medications given during this visit Medications  gentamicin (GARAMYCIN) injection 240 mg (not administered)  azithromycin (ZITHROMAX) tablet 2,000 mg (not administered)  ketorolac (TORADOL) injection 15 mg (15 mg Intramuscular Given 09/07/16 1749)  acetaminophen (TYLENOL) tablet 1,000 mg (1,000 mg Oral Given 09/07/16 1749)     The patient appears reasonably screen and/or stabilized for discharge and I doubt any other medical condition or other [facility] requiring further screening, evaluation, or treatment in the ED at this time prior to discharge.    Final Clinical Impressions(s) / ED Diagnoses   Final diagnoses:  Epididymitis  Testicular pain    New Prescriptions New Prescriptions   DOXYCYCLINE (VIBRAMYCIN) 100 MG CAPSULE    Take 1 capsule (100 mg total) by  mouth 2 (two) times daily. One po bid x 7 days     Melene Plan, DO 09/07/16 1610

## 2017-07-16 ENCOUNTER — Encounter (HOSPITAL_COMMUNITY): Payer: Self-pay | Admitting: *Deleted

## 2017-07-16 ENCOUNTER — Emergency Department (HOSPITAL_COMMUNITY)
Admission: EM | Admit: 2017-07-16 | Discharge: 2017-07-17 | Disposition: A | Discharge status: Home / Self Care | Payer: 59 | Attending: Emergency Medicine | Admitting: Emergency Medicine

## 2017-07-16 ENCOUNTER — Other Ambulatory Visit: Payer: Self-pay

## 2017-07-16 ENCOUNTER — Emergency Department (HOSPITAL_COMMUNITY): Payer: 59

## 2017-07-16 DIAGNOSIS — K5732 Diverticulitis of large intestine without perforation or abscess without bleeding: Secondary | ICD-10-CM | POA: Insufficient documentation

## 2017-07-16 DIAGNOSIS — Z79899 Other long term (current) drug therapy: Secondary | ICD-10-CM | POA: Insufficient documentation

## 2017-07-16 DIAGNOSIS — R109 Unspecified abdominal pain: Secondary | ICD-10-CM | POA: Diagnosis present

## 2017-07-16 DIAGNOSIS — F17210 Nicotine dependence, cigarettes, uncomplicated: Secondary | ICD-10-CM | POA: Diagnosis not present

## 2017-07-16 LAB — COMPREHENSIVE METABOLIC PANEL
ALK PHOS: 53 U/L (ref 38–126)
ALT: 132 U/L — ABNORMAL HIGH (ref 0–44)
ANION GAP: 7 (ref 5–15)
AST: 68 U/L — ABNORMAL HIGH (ref 15–41)
Albumin: 3.9 g/dL (ref 3.5–5.0)
BILIRUBIN TOTAL: 0.7 mg/dL (ref 0.3–1.2)
BUN: 17 mg/dL (ref 6–20)
CALCIUM: 9.2 mg/dL (ref 8.9–10.3)
CO2: 29 mmol/L (ref 22–32)
Chloride: 106 mmol/L (ref 98–111)
Creatinine, Ser: 0.98 mg/dL (ref 0.61–1.24)
GFR calc non Af Amer: >60 mL/min (ref >60)
Glucose, Bld: 94 mg/dL (ref 70–99)
POTASSIUM: 4.2 mmol/L (ref 3.5–5.1)
SODIUM: 142 mmol/L (ref 135–145)
TOTAL PROTEIN: 7.1 g/dL (ref 6.5–8.1)

## 2017-07-16 LAB — CBC
HEMATOCRIT: 42 % (ref 39.0–52.0)
Hemoglobin: 14.4 g/dL (ref 13.0–17.0)
MCH: 31.2 pg (ref 26.0–34.0)
MCHC: 34.3 g/dL (ref 30.0–36.0)
MCV: 91.1 fL (ref 78.0–100.0)
Platelets: 284 10*3/uL (ref 150–400)
RBC: 4.61 MIL/uL (ref 4.22–5.81)
RDW: 13.5 % (ref 11.5–15.5)
WBC: 6.3 10*3/uL (ref 4.0–10.5)

## 2017-07-16 LAB — URINALYSIS, ROUTINE W REFLEX MICROSCOPIC
Bilirubin Urine: NEGATIVE
GLUCOSE, UA: NEGATIVE mg/dL
Hgb urine dipstick: NEGATIVE
Ketones, ur: NEGATIVE mg/dL
Leukocytes, UA: NEGATIVE
NITRITE: NEGATIVE
PH: 5 (ref 5.0–8.0)
PROTEIN: NEGATIVE mg/dL
SPECIFIC GRAVITY, URINE: 1.038 — AB (ref 1.005–1.030)

## 2017-07-16 LAB — I-STAT TROPONIN, ED: Troponin i, poc: 0 ng/mL (ref 0.00–0.08)

## 2017-07-16 LAB — LIPASE, BLOOD: Lipase: 56 U/L — ABNORMAL HIGH (ref 11–51)

## 2017-07-16 IMAGING — CT CT ABD-PELV W/ CM
2 of 4 series · 16 of 46 positions shown, 18 images · IV contrast (ISOVUE)
Comparison: None.

CLINICAL DATA: Right-sided abdominal pain for several days with
vomiting yesterday.

EXAM:
CT ABDOMEN AND PELVIS WITH CONTRAST
TECHNIQUE: Multidetector CT imaging of the abdomen and pelvis was performed
using the standard protocol following bolus administration of
intravenous contrast.
CONTRAST:  100mL [LN] IOPAMIDOL ([LN]) INJECTION 61%

[Series 2: axial st · axial · 0.83mm/px · z∈[+1151,+1596]mm · 13 of 101 slices shown, 15 images]
[im 6/101  soft-tissue]
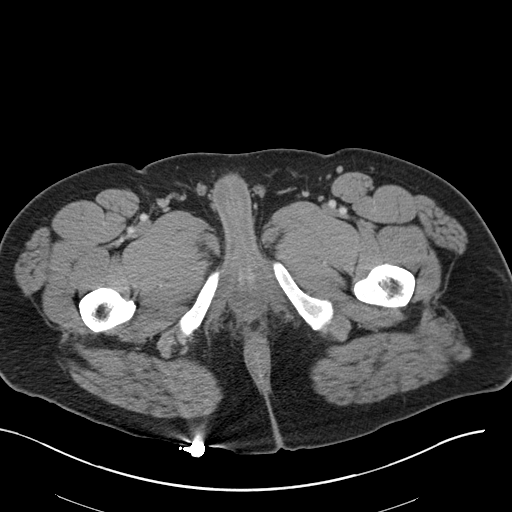
[im 6/101  bone]
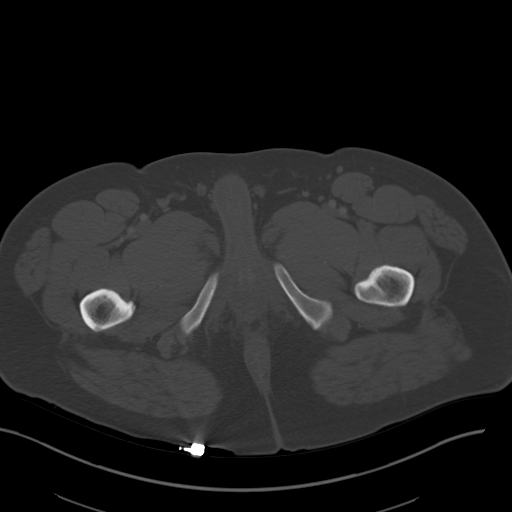
[im 12/101  soft-tissue]
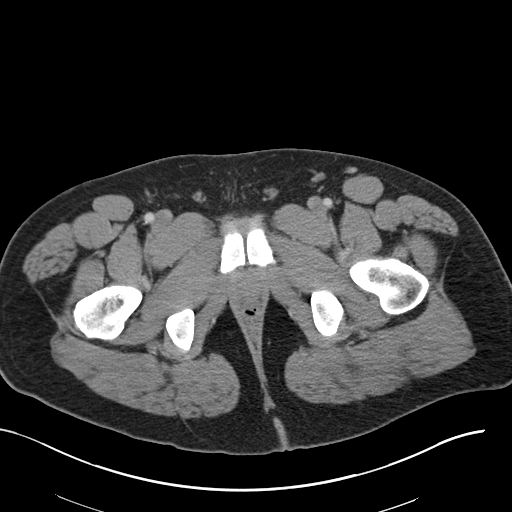
[im 23/101  soft-tissue]
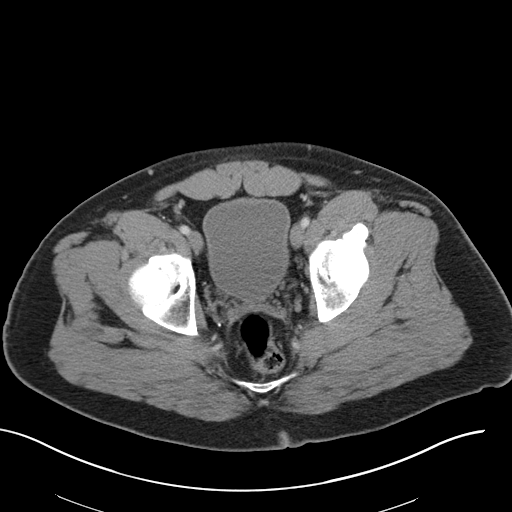
[im 28/101  soft-tissue]
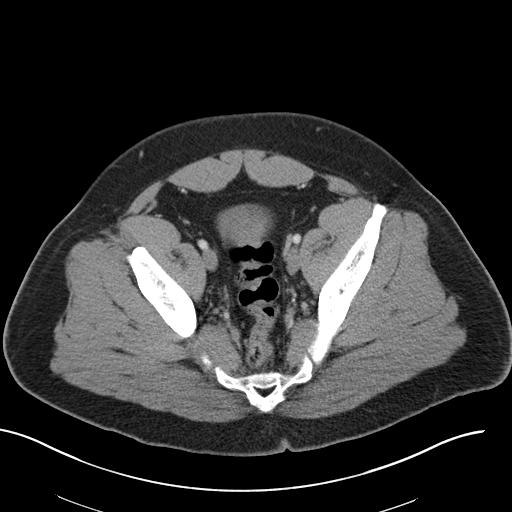
[im 34/101  soft-tissue]
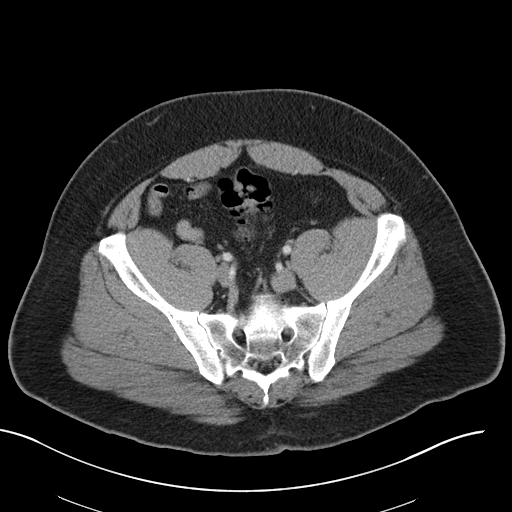
[im 45/101  soft-tissue]
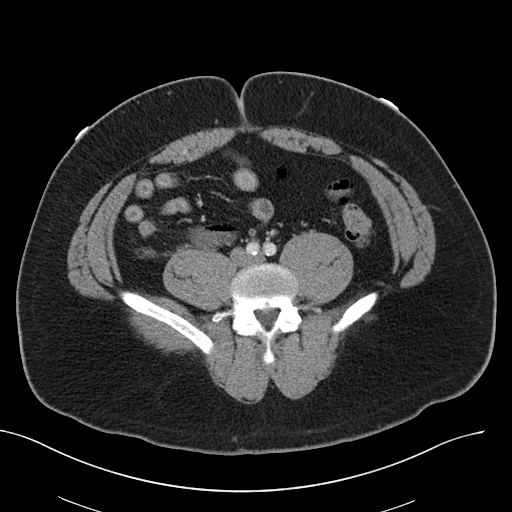
[im 51/101  soft-tissue]
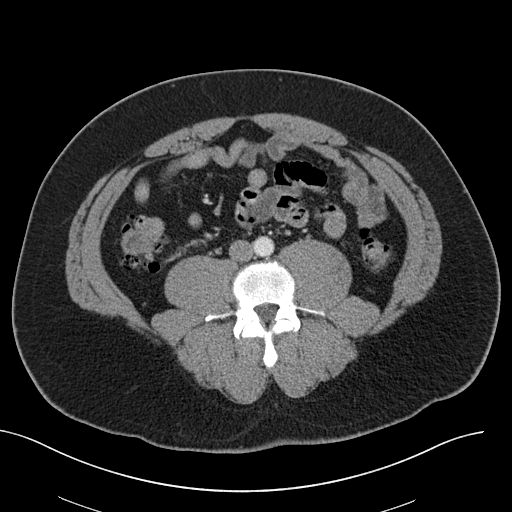
[im 56/101  soft-tissue]
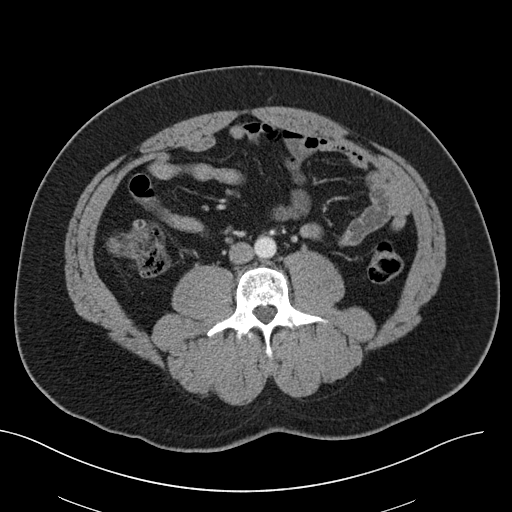
[im 67/101  soft-tissue]
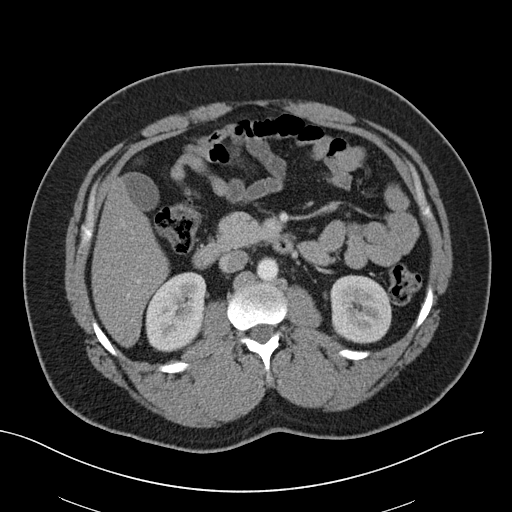
[im 67/101  bone]
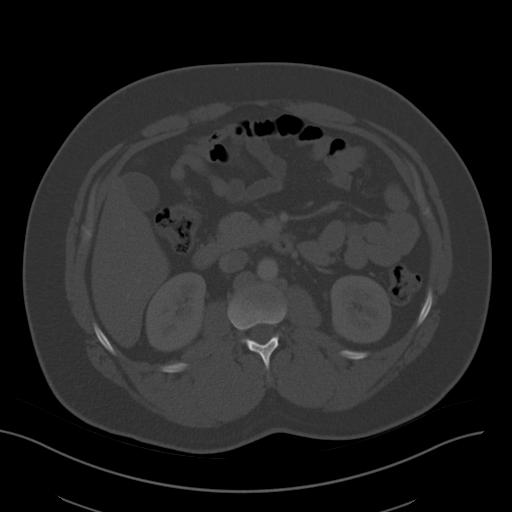
[im 73/101  soft-tissue]
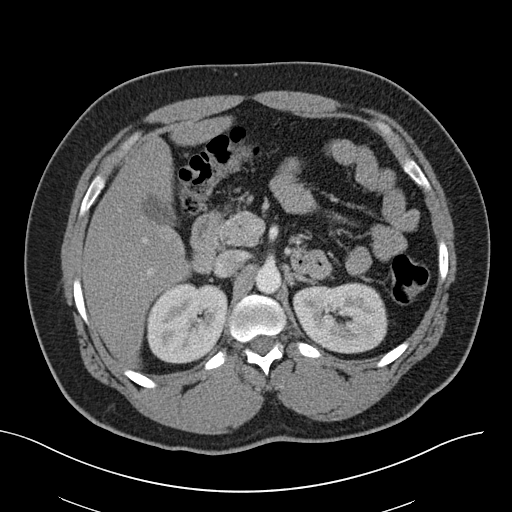
[im 78/101  soft-tissue]
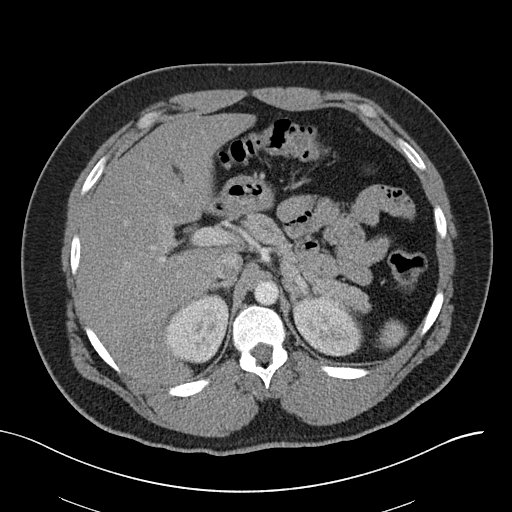
[im 89/101  soft-tissue]
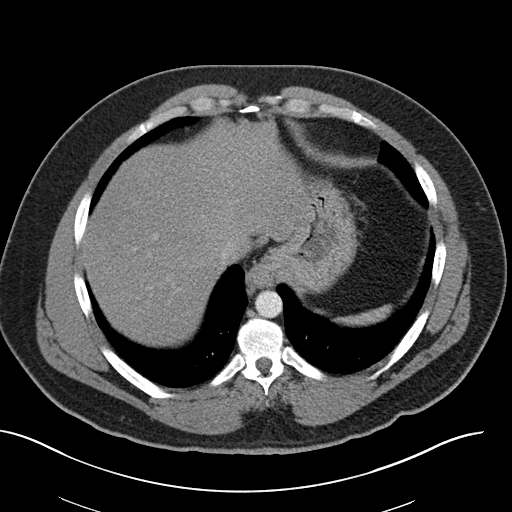
[im 95/101  soft-tissue]
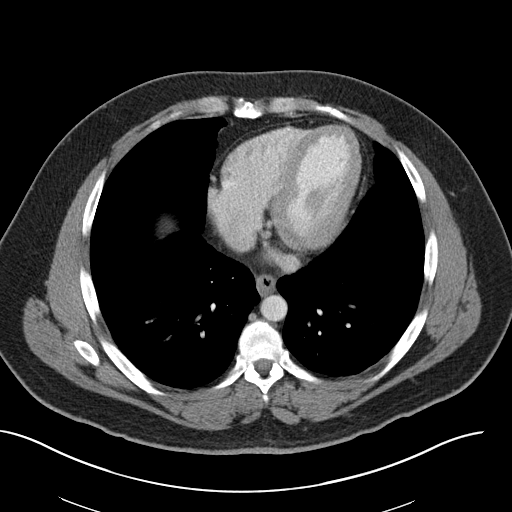

[Series 5: coronal st · coronal · 0.87mm/px · 3 of 114 slices shown]
[im 38/114  soft-tissue]
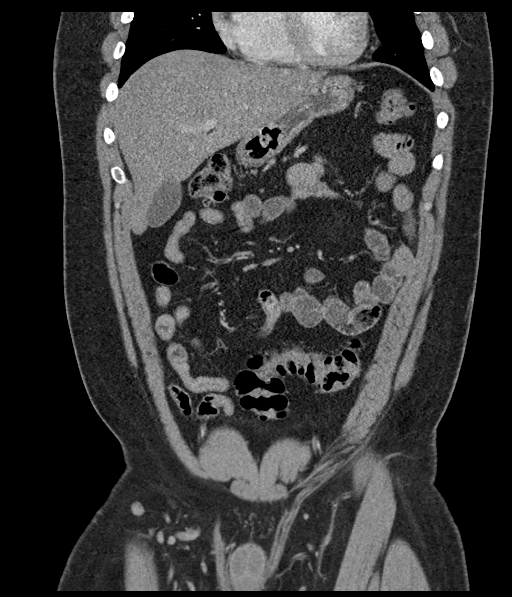
[im 51/114  soft-tissue]
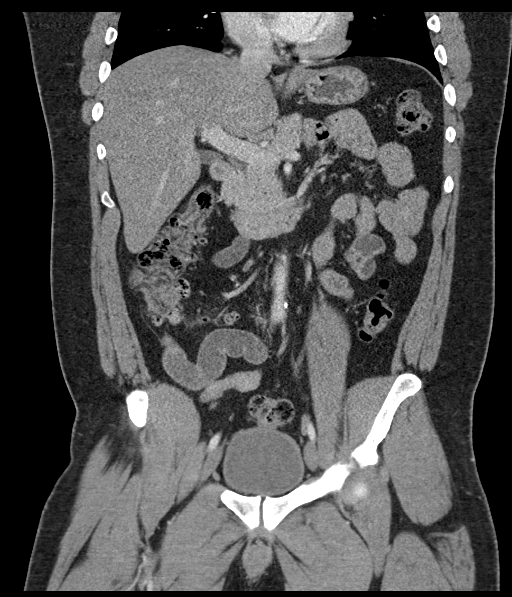
[im 63/114  soft-tissue]
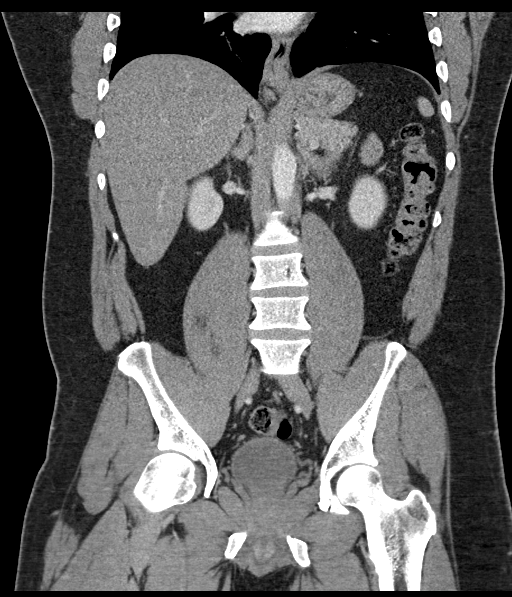

[16 of 46 positions shown; findings below may reference images not displayed]

FINDINGS: Lower chest: Normal heart size. No pericardial effusion. Small
hiatal hernia. Bibasilar dependent atelectasis.

Hepatobiliary: Mild steatosis of the liver. Normal gallbladder. No
mass or biliary dilatation.

Pancreas: Normal

Spleen: Normal

Adrenals/Urinary Tract: Normal

Stomach/Bowel: Normal appendix. Decompressed stomach with small
hiatal hernia. Nonobstructed, nondistended small bowel. No acute
large bowel inflammation. Right-sided colonic diverticulosis with
mild right-sided pericolonic inflammation suggesting right-sided
diverticulitis.

Vascular/Lymphatic: Aortic atherosclerosis. No enlarged abdominal or
pelvic lymph nodes.

Reproductive: Prostate is unremarkable.

Other: No free air nor free fluid.

Musculoskeletal: No acute or significant osseous findings.
IMPRESSION: 1. There is right-sided colonic diverticulosis with mild pericolonic
inflammation adjacent to the cecum compatible with mild right-sided
diverticulitis. Normal appendix.
2. Mild hepatic steatosis.

## 2017-07-16 MED ORDER — IOPAMIDOL (ISOVUE-300) INJECTION 61%
100.0000 mL | Freq: Once | INTRAVENOUS | Status: AC | PRN
  Administered 2017-07-16: 100 mL via INTRAVENOUS

## 2017-07-16 MED ORDER — SODIUM CHLORIDE 0.9 % IV BOLUS
1000.0000 mL | Freq: Once | INTRAVENOUS | Status: AC
  Administered 2017-07-16: 1000 mL via INTRAVENOUS

## 2017-07-16 MED ORDER — METRONIDAZOLE 500 MG PO TABS
500.0000 mg | ORAL_TABLET | Freq: Once | ORAL | Status: AC
  Administered 2017-07-16: 500 mg via ORAL
  Filled 2017-07-16: qty 1

## 2017-07-16 MED ORDER — MORPHINE SULFATE (PF) 4 MG/ML IV SOLN
4.0000 mg | Freq: Once | INTRAVENOUS | Status: AC
  Administered 2017-07-16: 4 mg via INTRAVENOUS
  Filled 2017-07-16: qty 1

## 2017-07-16 MED ORDER — IOPAMIDOL (ISOVUE-300) INJECTION 61%
INTRAVENOUS | Status: AC
  Filled 2017-07-16: qty 100

## 2017-07-16 MED ORDER — CIPROFLOXACIN HCL 500 MG PO TABS
500.0000 mg | ORAL_TABLET | Freq: Once | ORAL | Status: AC
  Administered 2017-07-16: 500 mg via ORAL
  Filled 2017-07-16: qty 1

## 2017-07-16 MED ORDER — ONDANSETRON HCL 4 MG/2ML IJ SOLN
4.0000 mg | Freq: Once | INTRAMUSCULAR | Status: AC
Start: 2017-07-16 — End: 2017-07-16
  Administered 2017-07-16: 4 mg via INTRAVENOUS
  Filled 2017-07-16: qty 2

## 2017-07-16 NOTE — ED Provider Notes (Signed)
[facility]-EMERGENCY DEPT Provider Note   CSN: [id number] Arrival date & time: 07/16/17  1911     History   Chief Complaint Chief Complaint  Patient presents with  . Abdominal Pain    HPI Kyle Silva is a 37 y.o. male.  Kyle Silva is a 37 y.o. Male with a history of GERD and diverticulitis, who presents to the emergency department for evaluation of periumbilical and lower right-sided abdominal pain.  He reports he has been having some intermittent pains for the past few days but this morning when he woke up the pain was much more severe and constant over the mid and right lower abdomen.  He reports he feels like his diverticulitis is flaring up again.  He reports one episode of vomiting yesterday, no hematemesis.  He reports feeling a little bit nauseated and having poor appetite today.  No fevers or chills.  He denies diarrhea, melena or hematochezia, reports today he is felt constipated and has been unable to have a bowel movement, has been able to pass gas.  He also reports he feels like his acid reflux is acting up and has not been relieved by his Zantac.  He denies dysuria or urinary frequency.  Denies chest pain or shortness of breath.  No prior history of abdominal surgeries.  He does not have a GI doctor he follows with.     Past Medical History:  Diagnosis Date  . Anginal pain (HCC) 07/13/2014   "mild"  . Diverticulitis   . Epididymitis   . Family history of adverse reaction to anesthesia    "dad had allergic reaction to anesthesia"   . GERD (gastroesophageal reflux disease)   . MI (mitral incompetence)     Patient Active Problem List   Diagnosis Date Noted  . Acute diverticulitis 08/30/2015  . Current smoker 07/13/2014  . Family history of premature CAD 07/13/2014  . Obese 07/13/2014  . Unstable angina (HCC) 07/13/2014  . Chest pain     Past Surgical History:  Procedure Laterality Date  . CARDIAC CATHETERIZATION N/A 07/13/2014   Procedure:  Left Heart Cath and Coronary Angiography;  Surgeon: Marykay Lex, MD;  Location: [facility] INVASIVE CV LAB;  Service: Cardiovascular;  Laterality: N/A;  . CYSTECTOMY Left ~ 2014   "wrist"  . CYSTECTOMY Left ~ 2014   "2 on my foot""        Home Medications    Prior to Admission medications   Medication Sig Start Date End Date Taking? Authorizing Provider  Aspirin-Salicylamide-Caffeine (BC HEADACHE PO) Take 1 packet by mouth daily as needed (pain).   Yes [provider]  ibuprofen (ADVIL,MOTRIN) 200 MG tablet Take 200 mg by mouth every 6 (six) hours as needed for moderate pain.   Yes [provider]  ciprofloxacin (CIPRO) 500 MG tablet Take 1 tablet (500 mg total) by mouth 2 (two) times daily. One po bid x 7 days 07/17/17   Dartha Lodge, PA-C  diazepam (VALIUM) 5 MG tablet Take 1 tablet (5 mg total) by mouth 2 (two) times daily. Patient not taking: Reported on 02/08/2016 12/08/15   Derwood Kaplan, MD  doxycycline (VIBRAMYCIN) 100 MG capsule Take 1 capsule (100 mg total) by mouth 2 (two) times daily. One po bid x 7 days Patient not taking: Reported on 07/16/2017 09/07/16   Melene Plan, DO  metroNIDAZOLE (FLAGYL) 500 MG tablet Take 1 tablet (500 mg total) by mouth 3 (three) times daily for 7 days. One po bid  x 7 days 07/17/17 07/24/17  Dartha Lodge, PA-C  naproxen (NAPROSYN) 500 MG tablet Take 1 tablet (500 mg total) by mouth 2 (two) times daily with a meal. Patient not taking: Reported on 02/08/2016 12/08/15   Derwood Kaplan, MD  ondansetron (ZOFRAN ODT) 4 MG disintegrating tablet 4mg  ODT q4 hours prn nausea/vomit 07/17/17   Dartha Lodge, PA-C  oxyCODONE-acetaminophen (PERCOCET) 5-325 MG tablet Take 1 tablet by mouth every 6 (six) hours as needed. 07/17/17   Dartha Lodge, PA-C  pantoprazole (PROTONIX) 40 MG tablet Take 1 tablet (40 mg total) by mouth daily. Patient not taking: Reported on 08/30/2015 07/14/14   Azalee Course, PA  polyethylene glycol [facility]) packet Take 17 g by mouth  daily. Patient not taking: Reported on 02/08/2016 08/30/15   Benjiman Core, MD  promethazine (PHENERGAN) 25 MG tablet Take 1 tablet (25 mg total) by mouth every 6 (six) hours as needed for nausea or vomiting. Patient not taking: Reported on 02/08/2016 08/30/15   Benjiman Core, MD  traMADol (ULTRAM) 50 MG tablet Take 0.5-1 tablets (25-50 mg total) by mouth every 6 (six) hours as needed. Patient not taking: Reported on 02/08/2016 08/31/15   Leatha Gilding, MD    Family History Family History  Problem Relation Age of Onset  . AAA (abdominal aortic aneurysm) Mother   . Heart failure Father     Social History Social History   Tobacco Use  . Smoking status: Current Every Day Smoker    Packs/day: 0.50    Years: 0.00    Pack years: 0.00    Types: Cigarettes  . Smokeless tobacco: Never Used  Substance Use Topics  . Alcohol use: No  . Drug use: No     Allergies   Penicillins   Review of Systems Review of Systems  Constitutional: Negative for chills and fever.  HENT: Negative.   Eyes: Negative for visual disturbance.  Respiratory: Negative for cough and shortness of breath.   Cardiovascular: Negative for chest pain.  Gastrointestinal: Positive for abdominal pain, constipation, nausea and vomiting. Negative for blood in stool and diarrhea.  Genitourinary: Negative for dysuria and frequency.  Musculoskeletal: Negative for arthralgias, back pain and myalgias.  Skin: Negative for color change and rash.  Neurological: Negative for dizziness, syncope and light-headedness.     Physical Exam Updated Vital Signs BP 123/82   Pulse 87   Temp 98.3 F (36.8 C) (Oral)   Resp 20   Ht 5\' 10"  (1.778 m)   Wt 113.4 kg (250 lb)   SpO2 100%   BMI 35.87 kg/m   Physical Exam  Constitutional: He appears well-developed and well-nourished. No distress.  HENT:  Head: Normocephalic and atraumatic.  Mouth/Throat: Oropharynx is clear and moist.  Eyes: Pupils are equal, round, and  reactive to light. EOM are normal. Right eye exhibits no discharge. Left eye exhibits no discharge.  Neck: Neck supple.  Cardiovascular: Normal rate, regular rhythm, normal heart sounds and intact distal pulses.  Pulmonary/Chest: Effort normal and breath sounds normal. No respiratory distress.  Respirations equal and unlabored, patient able to speak in full sentences, lungs clear to auscultation bilaterally  Abdominal: Soft. Normal appearance and bowel sounds are normal. He exhibits no distension. There is tenderness. There is guarding. There is no rigidity, no rebound, no CVA tenderness, no tenderness at McBurney's point and negative Murphy's sign.  Abdomen soft, nondistended, bowel sounds present throughout, there is tenderness across the periumbilical and lower abdomen with guarding, there is no right  upper quadrant tenderness, negative Murphy sign, no CVA tenderness bilaterally  Musculoskeletal: He exhibits no edema or deformity.  Neurological: He is alert. Coordination normal.  Skin: Skin is warm and dry. Capillary refill takes less than 2 seconds. He is not diaphoretic.  Psychiatric: He has a normal mood and affect. His behavior is normal.  Nursing note and vitals reviewed.    ED Treatments / Results  Labs (all labs ordered are listed, but only abnormal results are displayed) Labs Reviewed  LIPASE, BLOOD - Abnormal; Notable for the following components:      Result Value   Lipase 56 (*)    All other components within normal limits  COMPREHENSIVE METABOLIC PANEL - Abnormal; Notable for the following components:   AST 68 (*)    ALT 132 (*)    All other components within normal limits  URINALYSIS, ROUTINE W REFLEX MICROSCOPIC - Abnormal; Notable for the following components:   Specific Gravity, Urine 1.038 (*)    All other components within normal limits  CBC  I-STAT TROPONIN, ED    EKG None  Radiology Ct Abdomen Pelvis W Contrast  Result Date: 07/16/2017 CLINICAL DATA:   Right-sided abdominal pain for several days with vomiting yesterday. EXAM: CT ABDOMEN AND PELVIS WITH CONTRAST TECHNIQUE: Multidetector CT imaging of the abdomen and pelvis was performed using the standard protocol following bolus administration of intravenous contrast. CONTRAST:  ISOVUE-300 IOPAMIDOL (ISOVUE-300) INJECTION 61% COMPARISON:  None. FINDINGS: Lower chest: Normal heart size. No pericardial effusion. Small hiatal hernia. Bibasilar dependent atelectasis. Hepatobiliary: Mild steatosis of the liver. Normal gallbladder. No mass or biliary dilatation. Pancreas: Normal Spleen: Normal Adrenals/Urinary Tract: Normal Stomach/Bowel: Normal appendix. Decompressed stomach with small hiatal hernia. Nonobstructed, nondistended small bowel. No acute large bowel inflammation. Right-sided colonic diverticulosis with mild right-sided pericolonic inflammation suggesting right-sided diverticulitis. Vascular/Lymphatic: Aortic atherosclerosis. No enlarged abdominal or pelvic lymph nodes. Reproductive: Prostate is unremarkable. Other: No free air nor free fluid. Musculoskeletal: No acute or significant osseous findings. IMPRESSION: 1. There is right-sided colonic diverticulosis with mild pericolonic inflammation adjacent to the cecum compatible with mild right-sided diverticulitis. Normal appendix. 2. Mild hepatic steatosis. Electronically Signed   By: Tollie Eth M.D.   On: 07/16/2017 23:17    Procedures Procedures (including critical care time)  Medications Ordered in ED Medications  iopamidol (ISOVUE-300) 61 % injection (has no administration in time range)  ondansetron (ZOFRAN) injection 4 mg (4 mg Intravenous Given 07/16/17 2231)  morphine 4 MG/ML injection 4 mg (4 mg Intravenous Given 07/16/17 2233)  sodium chloride 0.9 % bolus 1,000 mL (0 mLs Intravenous Stopped 07/17/17 0059)  iopamidol (ISOVUE-300) 61 % injection 100 mL (100 mLs Intravenous Contrast Given 07/16/17 2245)  metroNIDAZOLE (FLAGYL) tablet 500  mg (500 mg Oral Given 07/16/17 2338)  ciprofloxacin (CIPRO) tablet 500 mg (500 mg Oral Given 07/16/17 2338)     Initial Impression / Assessment and Plan / ED Course  I have reviewed the triage vital signs and the nursing notes.  Pertinent labs & imaging results that were available during my care of the patient were reviewed by me and considered in my medical decision making (see chart for details).  Patient presents for evaluation of mid to lower abdominal pain.  One episode of vomiting and poor appetite.  No fevers or urinary symptoms.  No chest pain or shortness of breath.  On arrival patient with normal vitals and appears to be in no acute distress.  He has tenderness with guarding across the lower and  mid abdomen.  Reports this feels like prior episodes of diverticulitis.  Abdominal labs, troponin and EKG ordered from triage.  There is no leukocytosis, normal hemoglobin, no acute electrolyte derangements, normal renal function, AST and ALT are slightly elevated but patient is not focally tender in the right upper quadrant to suggest cholecystitis.  Normal lipase, no evidence of UTI and no hematuria.  Negative troponin and EKG without concerning ischemic changes.  Will get CT abdomen pelvis, IV fluid bolus, morphine and Zofran for symptom management.  CT shows right-sided colonic diverticulitis, no evidence of perforation or abscess, normal appendix, mild hepatic steatosis.  Patient tolerating p.o. here in the ED, given first doses of Cipro and Flagyl.  Patient provided antibiotic prescriptions as well as pain medication and Zofran and encouraged to use bowel regimen.  Patient to follow-up with GI.  Strict return precautions discussed.  Patient expresses understanding and is in agreement with plan.  Final Clinical Impressions(s) / ED Diagnoses   Final diagnoses:  Diverticulitis of large intestine without perforation or abscess without bleeding    ED Discharge Orders        Ordered     ciprofloxacin (CIPRO) 500 MG tablet  2 times daily     07/17/17 0012    metroNIDAZOLE (FLAGYL) 500 MG tablet  3 times daily     07/17/17 0012    ondansetron (ZOFRAN ODT) 4 MG disintegrating tablet     07/17/17 0012    oxyCODONE-acetaminophen (PERCOCET) 5-325 MG tablet  Every 6 hours PRN     07/17/17 0012       Dartha LodgeFord, Anayla Giannetti N, PA-C 07/17/17 0112    Mancel BaleWentz, Elliott, MD 07/17/17 [phone]

## 2017-07-16 NOTE — ED Triage Notes (Signed)
Pt reports right sided abdominal pain for several days, associated with episode of vomiting yesterday. Also states he has been having acid reflux not relieved by Zantac.

## 2017-07-17 MED ORDER — ONDANSETRON 4 MG PO TBDP: ORAL_TABLET | ORAL | 0 refills | Status: DC

## 2017-07-17 MED ORDER — OXYCODONE-ACETAMINOPHEN 5-325 MG PO TABS: 1.0000 | ORAL_TABLET | Freq: Four times a day (QID) | ORAL | 0 refills | Status: DC | PRN

## 2017-07-17 MED ORDER — CIPROFLOXACIN HCL 500 MG PO TABS: 500.0000 mg | ORAL_TABLET | Freq: Two times a day (BID) | ORAL | 0 refills | Status: DC

## 2017-07-17 MED ORDER — METRONIDAZOLE 500 MG PO TABS: 500.0000 mg | ORAL_TABLET | Freq: Three times a day (TID) | ORAL | 0 refills | Status: AC

## 2017-07-17 NOTE — Discharge Instructions (Signed)
Your CT scan shows diverticulitis.  Please take entire course of both antibiotics as directed, make sure you complete antibiotics even if your symptoms improve.  Zofran as needed for nausea and Percocet as needed for pain, you may take ibuprofen in addition to this.  Please make sure you use daily MiraLAX and Dulcolax as needed to ensure soft regular bowel movements.  You will need to follow-up with GI after completing antibiotics for follow-up.  Please return to the emergency department if you have any worsening abdominal pain, fevers, persistent vomiting and or unable to keep down antibiotics, large amounts of blood in the stool or any other new or concerning symptoms.

## 2017-10-14 ENCOUNTER — Ambulatory Visit: Payer: Self-pay | Admitting: Family Medicine

## 2018-02-05 ENCOUNTER — Telehealth: Payer: Self-pay | Admitting: *Deleted

## 2018-02-05 NOTE — Telephone Encounter (Signed)
REFERRAL SENT TO SCHEDULING AND NOTES ON FILE FROM [facility] [phone] ,MARIAN HAZY, FNP,NP

## 2018-02-10 ENCOUNTER — Ambulatory Visit: Payer: 59 | Admitting: Podiatrist

## 2018-02-24 ENCOUNTER — Ambulatory Visit: Payer: 59 | Admitting: Podiatrist

## 2018-05-27 ENCOUNTER — Emergency Department (HOSPITAL_COMMUNITY)
Admission: EM | Admit: 2018-05-27 | Discharge: 2018-05-27 | Disposition: A | Discharge status: Home / Self Care | Payer: 59 | Attending: Emergency Medicine | Admitting: Emergency Medicine

## 2018-05-27 ENCOUNTER — Other Ambulatory Visit: Payer: Self-pay

## 2018-05-27 ENCOUNTER — Encounter (HOSPITAL_COMMUNITY): Payer: Self-pay | Admitting: Emergency Medicine

## 2018-05-27 ENCOUNTER — Emergency Department (HOSPITAL_COMMUNITY): Payer: 59

## 2018-05-27 DIAGNOSIS — Z7982 Long term (current) use of aspirin: Secondary | ICD-10-CM | POA: Diagnosis not present

## 2018-05-27 DIAGNOSIS — N50819 Testicular pain, unspecified: Secondary | ICD-10-CM

## 2018-05-27 DIAGNOSIS — N433 Hydrocele, unspecified: Secondary | ICD-10-CM

## 2018-05-27 DIAGNOSIS — Z79899 Other long term (current) drug therapy: Secondary | ICD-10-CM | POA: Diagnosis not present

## 2018-05-27 DIAGNOSIS — F17210 Nicotine dependence, cigarettes, uncomplicated: Secondary | ICD-10-CM | POA: Insufficient documentation

## 2018-05-27 LAB — URINALYSIS, ROUTINE W REFLEX MICROSCOPIC
Bilirubin Urine: NEGATIVE
Glucose, UA: NEGATIVE mg/dL
Hgb urine dipstick: NEGATIVE
Ketones, ur: NEGATIVE mg/dL
Leukocytes,Ua: NEGATIVE
Nitrite: NEGATIVE
Protein, ur: NEGATIVE mg/dL
Specific Gravity, Urine: 1.023 (ref 1.005–1.030)
pH: 6 (ref 5.0–8.0)

## 2018-05-27 IMAGING — US ULTRASOUND SCROTUM DOPPLER COMPLETE
1 series · 14 of 25 positions shown · non-contrast
Comparison: Prior ultrasound from [DATE].

CLINICAL DATA: Initial evaluation for acute left testicular pain.
History of epididymitis.

EXAM:
SCROTAL ULTRASOUND
DOPPLER ULTRASOUND OF THE TESTICLES
TECHNIQUE: Complete ultrasound examination of the testicles, epididymis, and
other scrotal structures was performed. Color and spectral Doppler
ultrasound were also utilized to evaluate blood flow to the
testicles.

[Series 1: ultrasound scrotum doppler complete · 14 of 41 slices shown]
[im 1/41]
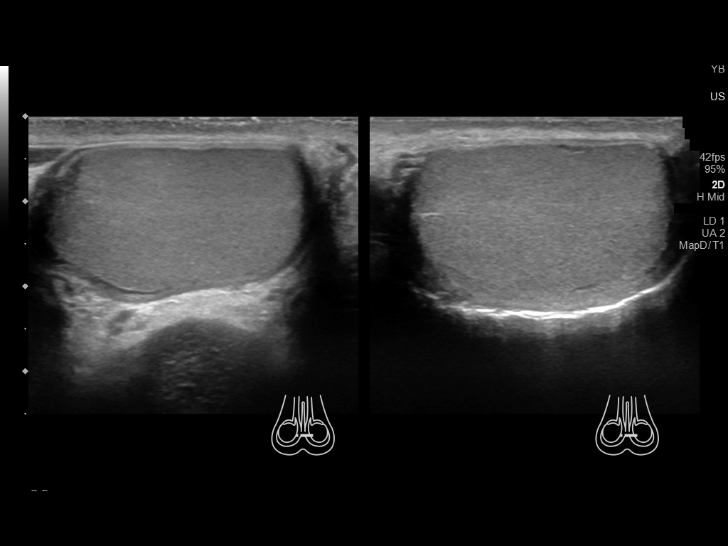
[im 4/41]
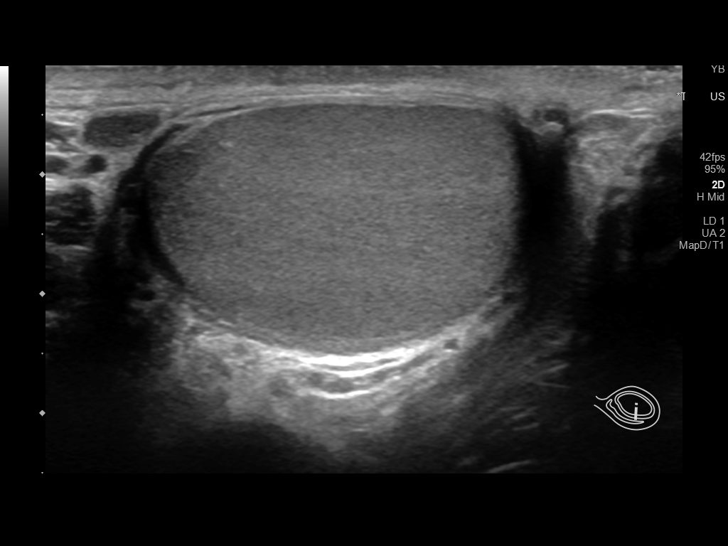
[im 7/41]
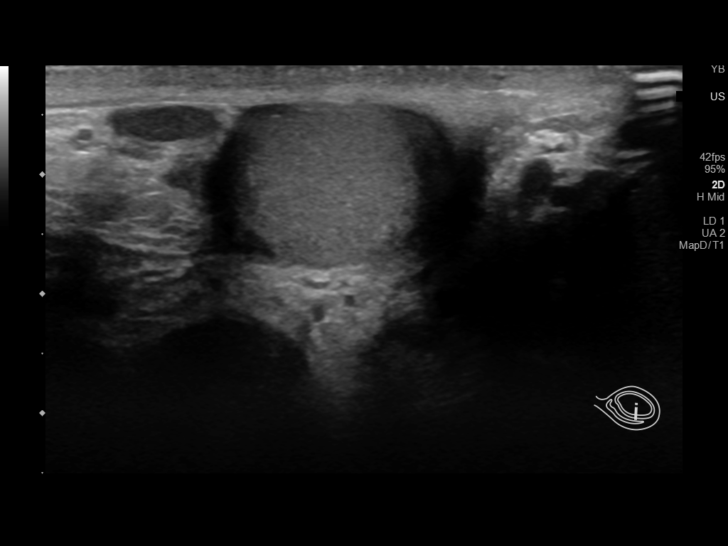
[im 11/41]
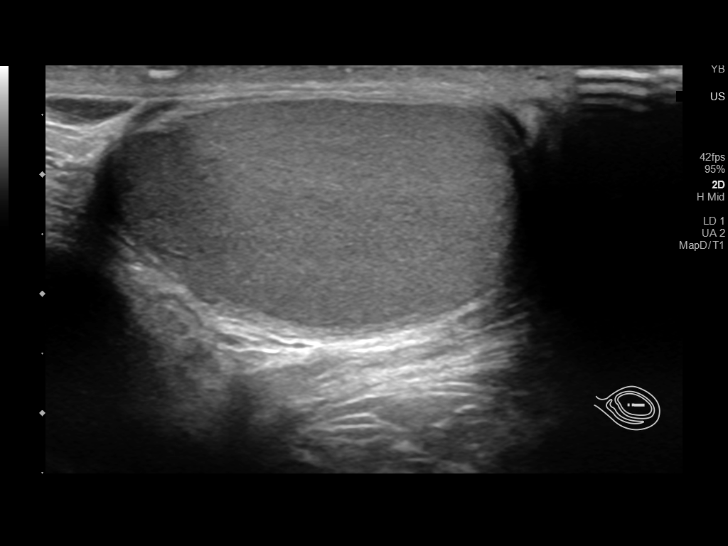
[im 14/41]
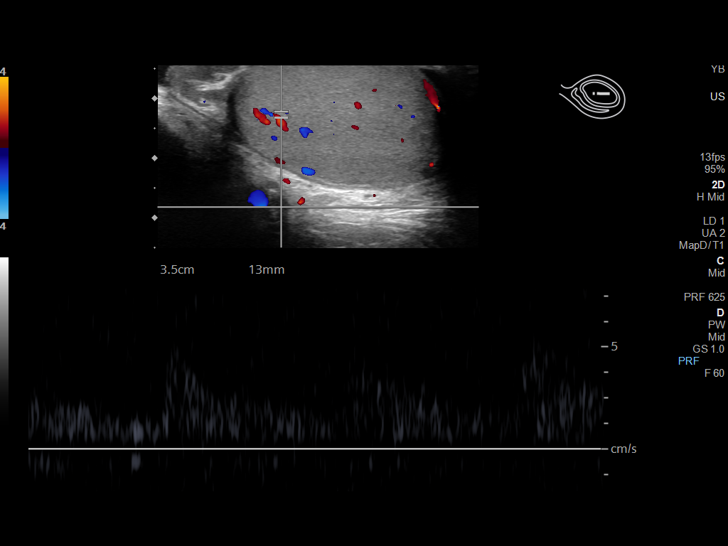
[im 16/41]
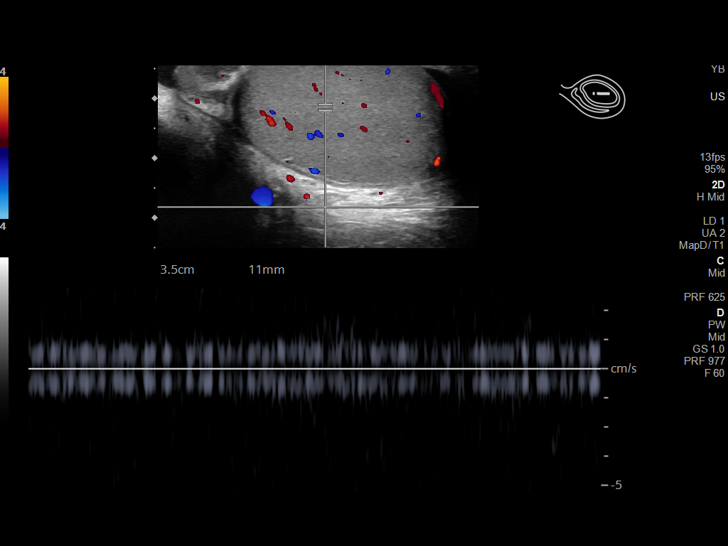
[im 19/41]
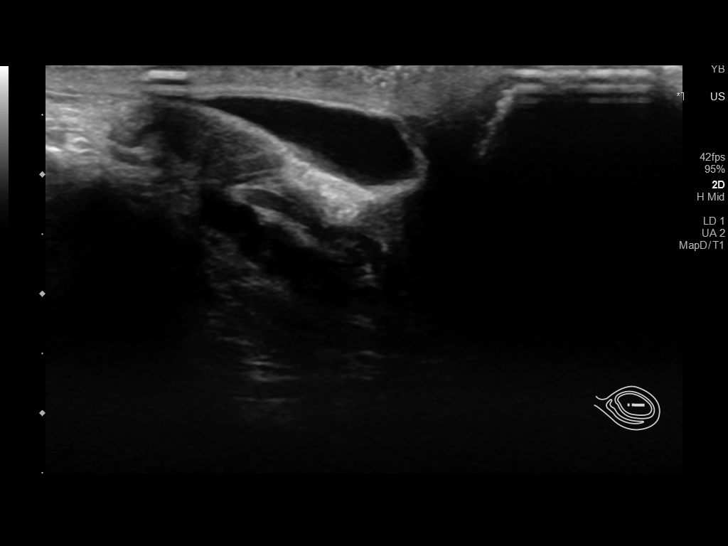
[im 22/41]
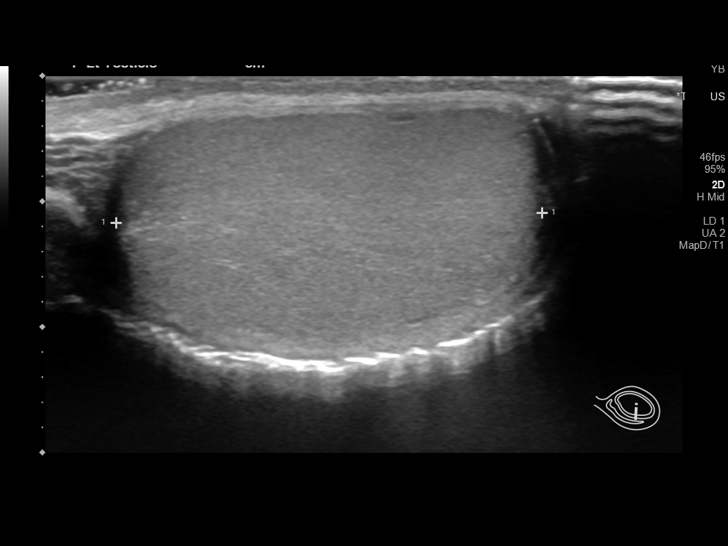
[im 26/41]
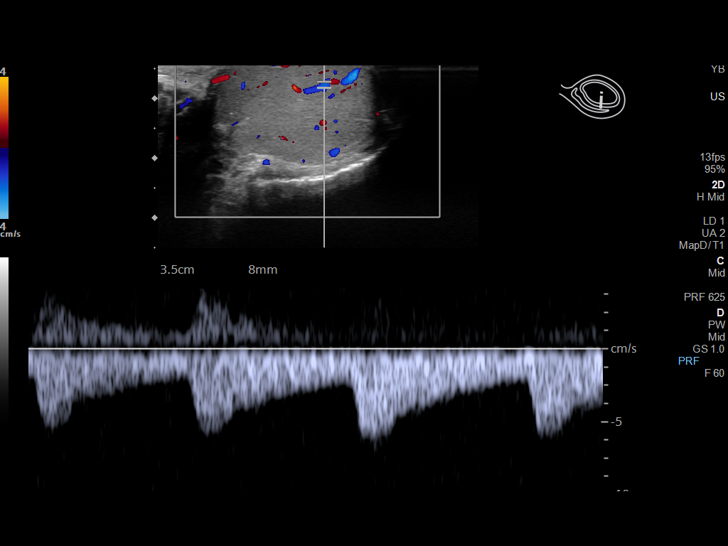
[im 27/41]
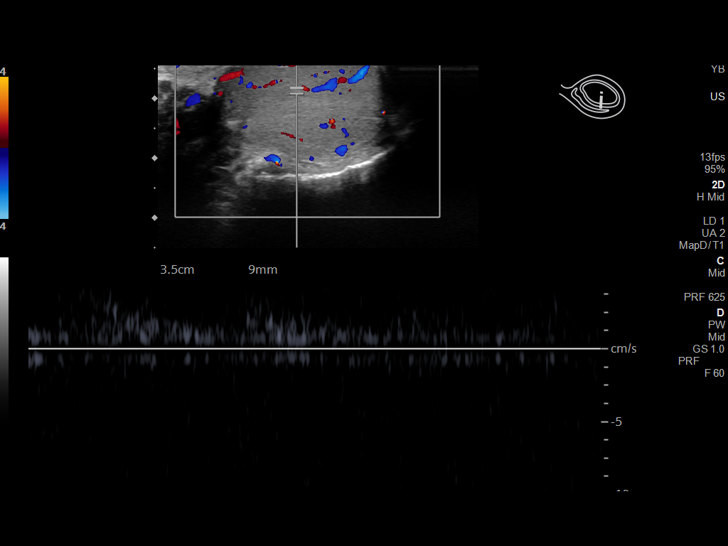
[im 31/41]
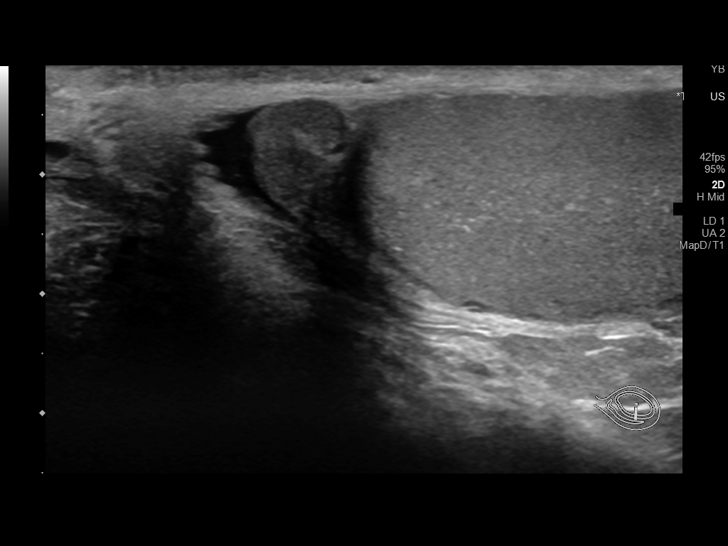
[im 34/41]
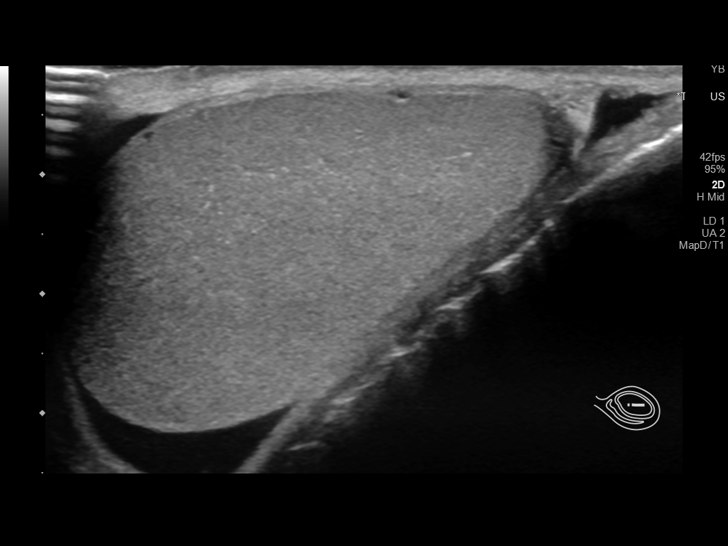
[im 37/41]
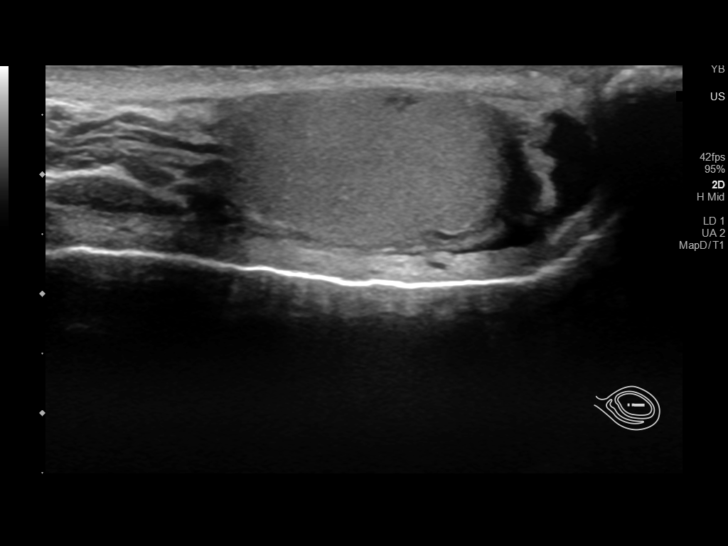
[im 41/41]
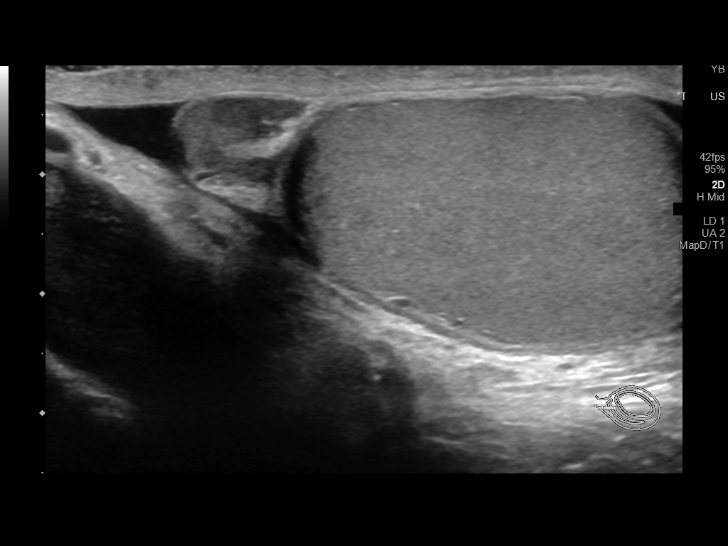

[14 of 25 positions shown; findings below may reference images not displayed]

FINDINGS: Right testicle

Measurements: 3.4 x 1.9 x 3.1 cm. No mass or microlithiasis
visualized.

Left testicle

Measurements: 4.6 x 2.5 x 3.4 cm. No mass or microlithiasis
visualized.

Right epididymis: Normal in size with heterogeneous echotexture. No
discrete lesion or increased vascularity.

Left epididymis: Normal in size with heterogeneous echotexture. No
discrete lesion or elevated vascularity.

Hydrocele:  Small bilateral hydroceles.

Varicocele:  None visualized.

Pulsed Doppler interrogation of both testes demonstrates normal low
resistance arterial and venous waveforms bilaterally.
IMPRESSION: 1. Small bilateral hydroceles.
2. Otherwise unremarkable scrotal ultrasound. No evidence for
torsion or other acute finding.

## 2018-05-27 MED ORDER — ACETAMINOPHEN 325 MG PO TABS
650.0000 mg | ORAL_TABLET | Freq: Once | ORAL | Status: AC
  Administered 2018-05-27: 650 mg via ORAL
  Filled 2018-05-27: qty 2

## 2018-05-27 MED ORDER — OXYCODONE-ACETAMINOPHEN 5-325 MG PO TABS: 1.0000 | ORAL_TABLET | ORAL | 0 refills | Status: DC | PRN

## 2018-05-27 MED ORDER — ONDANSETRON HCL 4 MG PO TABS: 4.0000 mg | ORAL_TABLET | Freq: Four times a day (QID) | ORAL | 0 refills | Status: DC

## 2018-05-27 NOTE — ED Notes (Signed)
Ultrasound bedside.

## 2018-05-27 NOTE — ED Triage Notes (Signed)
Pt reports testicle pains for couple days. Has feeling of a pulling feeling. Denies urinary problems.

## 2018-05-27 NOTE — ED Provider Notes (Signed)
[facility]-EMERGENCY DEPT Provider Note   CSN: [id number] Arrival date & time: 05/27/18  1738    History   Chief Complaint Chief Complaint  Patient presents with   Testicle Pain    HPI Kyle Silva is a 38 y.o. male.     The history is provided by the patient and medical records. No language interpreter was used.  Testicle Pain  This is a recurrent problem. The current episode started more than 2 days ago. The problem occurs constantly. The problem has not changed since onset.Pertinent negatives include no chest pain, no abdominal pain, no headaches and no shortness of breath. Nothing aggravates the symptoms. Nothing relieves the symptoms. He has tried nothing for the symptoms. The treatment provided no relief.    Past Medical History:  Diagnosis Date   Anginal pain (HCC) 07/13/2014   "mild"   Diverticulitis    Epididymitis    Family history of adverse reaction to anesthesia    "dad had allergic reaction to anesthesia"    GERD (gastroesophageal reflux disease)    MI (mitral incompetence)     Patient Active Problem List   Diagnosis Date Noted   Acute diverticulitis 08/30/2015   Current smoker 07/13/2014   Family history of premature CAD 07/13/2014   Obese 07/13/2014   Unstable angina (HCC) 07/13/2014   Chest pain     Past Surgical History:  Procedure Laterality Date   CARDIAC CATHETERIZATION N/A 07/13/2014   Procedure: Left Heart Cath and Coronary Angiography;  Surgeon: Marykay Lex, MD;  Location: [facility] INVASIVE CV LAB;  Service: Cardiovascular;  Laterality: N/A;   CYSTECTOMY Left ~ 2014   "wrist"   CYSTECTOMY Left ~ 2014   "2 on my foot""        Home Medications    Prior to Admission medications   Medication Sig Start Date End Date Taking? Authorizing Provider  Aspirin-Salicylamide-Caffeine (BC HEADACHE PO) Take 1 packet by mouth daily as needed (pain).    [provider]  ciprofloxacin (CIPRO) 500 MG tablet  Take 1 tablet (500 mg total) by mouth 2 (two) times daily. One po bid x 7 days 07/17/17   Dartha Lodge, PA-C  diazepam (VALIUM) 5 MG tablet Take 1 tablet (5 mg total) by mouth 2 (two) times daily. Patient not taking: Reported on 02/08/2016 12/08/15   Derwood Kaplan, MD  doxycycline (VIBRAMYCIN) 100 MG capsule Take 1 capsule (100 mg total) by mouth 2 (two) times daily. One po bid x 7 days Patient not taking: Reported on 07/16/2017 09/07/16   Melene Plan, DO  ibuprofen (ADVIL,MOTRIN) 200 MG tablet Take 200 mg by mouth every 6 (six) hours as needed for moderate pain.    [provider]  naproxen (NAPROSYN) 500 MG tablet Take 1 tablet (500 mg total) by mouth 2 (two) times daily with a meal. Patient not taking: Reported on 02/08/2016 12/08/15   Derwood Kaplan, MD  ondansetron (ZOFRAN ODT) 4 MG disintegrating tablet 4mg  ODT q4 hours prn nausea/vomit 07/17/17   Dartha Lodge, PA-C  oxyCODONE-acetaminophen (PERCOCET) 5-325 MG tablet Take 1 tablet by mouth every 6 (six) hours as needed. 07/17/17   Dartha Lodge, PA-C  pantoprazole (PROTONIX) 40 MG tablet Take 1 tablet (40 mg total) by mouth daily. Patient not taking: Reported on 08/30/2015 07/14/14   Azalee Course, PA  polyethylene glycol [facility]) packet Take 17 g by mouth daily. Patient not taking: Reported on 02/08/2016 08/30/15   Benjiman Core, MD  promethazine [facility]) 25  MG tablet Take 1 tablet (25 mg total) by mouth every 6 (six) hours as needed for nausea or vomiting. Patient not taking: Reported on 02/08/2016 08/30/15   Benjiman CorePickering, Nathan, MD  traMADol (ULTRAM) 50 MG tablet Take 0.5-1 tablets (25-50 mg total) by mouth every 6 (six) hours as needed. Patient not taking: Reported on 02/08/2016 08/31/15   Leatha GildingGherghe, Costin M, MD    Family History Family History  Problem Relation Age of Onset   AAA (abdominal aortic aneurysm) Mother    Heart failure Father     Social History Social History   Tobacco Use   Smoking status: Current Every Day  Smoker    Packs/day: 0.50    Years: 0.00    Pack years: 0.00    Types: Cigarettes   Smokeless tobacco: Never Used  Substance Use Topics   Alcohol use: No   Drug use: No     Allergies   Penicillins   Review of Systems Review of Systems  Constitutional: Negative for chills, diaphoresis, fatigue and fever.  HENT: Negative for congestion, ear pain and sore throat.   Eyes: Negative for pain and visual disturbance.  Respiratory: Negative for cough, chest tightness, shortness of breath and wheezing.   Cardiovascular: Negative for chest pain and palpitations.  Gastrointestinal: Negative for abdominal pain, constipation, diarrhea, nausea and vomiting.  Genitourinary: Positive for testicular pain. Negative for dysuria, flank pain, frequency, hematuria and scrotal swelling.  Musculoskeletal: Negative for arthralgias, back pain, neck pain and neck stiffness.  Skin: Negative for color change, rash and wound.  Neurological: Negative for seizures, syncope and headaches.  Psychiatric/Behavioral: Negative for agitation.  All other systems reviewed and are negative.    Physical Exam Updated Vital Signs BP (!) 125/99 (BP Location: Left Arm)    Pulse 84    Temp 98.7 F (37.1 C) (Oral)    Resp 18    SpO2 100%   Physical Exam Vitals signs and nursing note reviewed. Exam conducted with a chaperone present.  Constitutional:      Appearance: He is well-developed.  HENT:     Head: Normocephalic and atraumatic.  Eyes:     Conjunctiva/sclera: Conjunctivae normal.  Neck:     Musculoskeletal: Neck supple.  Cardiovascular:     Rate and Rhythm: Normal rate and regular rhythm.     Heart sounds: No murmur.  Pulmonary:     Effort: Pulmonary effort is normal. No respiratory distress.     Breath sounds: Normal breath sounds.  Abdominal:     Palpations: Abdomen is soft.     Tenderness: There is no abdominal tenderness.  Genitourinary:    Penis: Normal.      Scrotum/Testes:        Right:  Tenderness not present.        Left: Tenderness present.       Comments: Left-sided testicle tenderness.  No erythema or swelling appreciated.  Normal penis.  Normal suprapubic area.  No evidence of Fournier's gangrene.  No hernia palpated. Musculoskeletal:        General: No tenderness.     Right lower leg: No edema.     Left lower leg: No edema.  Skin:    General: Skin is warm and dry.     Capillary Refill: Capillary refill takes less than 2 seconds.  Neurological:     General: No focal deficit present.     Mental Status: He is alert.  Psychiatric:        Mood and Affect: Mood  normal.      ED Treatments / Results  Labs (all labs ordered are listed, but only abnormal results are displayed) Labs Reviewed  URINE CULTURE  URINALYSIS, ROUTINE W REFLEX MICROSCOPIC    EKG None  Radiology US Scrotum W/doppler  Result Date: 05/27/2018 CLINICAL DATA:  Initial evaluation for acute left testicular pain. History of epididymitis. EXAM: SCROTAL ULTRASOUND DOPPLER ULTRASOUND OF THE TESTICLES TECHNIQUE: Complete ultrasound examination of the testicles, epididymis, and other scrotal structures was performed. Color and spectral Doppler ultrasound were also utilized to evaluate blood flow to the testicles. COMPARISON:  Prior ultrasound from 09/07/2016. FINDINGS: Right testicle Measurements: 3.4 x 1.9 x 3.1 cm. No mass or microlithiasis visualized. Left testicle Measurements: 4.6 x 2.5 x 3.4 cm. No mass or microlithiasis visualized. Right epididymis: Normal in size with heterogeneous echotexture. No discrete lesion or increased vascularity. Left epididymis: Normal in size with heterogeneous echotexture. No discrete lesion or elevated vascularity. Hydrocele:  Small bilateral hydroceles. Varicocele:  None visualized. Pulsed Doppler interrogation of both testes demonstrates normal low resistance arterial and venous waveforms bilaterally. IMPRESSION: 1. Small bilateral hydroceles. 2. Otherwise  unremarkable scrotal ultrasound. No evidence for torsion or other acute finding. Electronically Signed   By: Rise Mu M.D.   On: 05/27/2018 20:24    Procedures Procedures (including critical care time)  Medications Ordered in ED Medications  acetaminophen (TYLENOL) tablet 650 mg (650 mg Oral Given 05/27/18 1938)     Initial Impression / Assessment and Plan / ED Course  I have reviewed the triage vital signs and the nursing notes.  Pertinent labs & imaging results that were available during my care of the patient were reviewed by me and considered in my medical decision making (see chart for details).        Israel Werts is a 37 y.o. male with a past medical history significant for diverticulitis, GERD, and epididymitis who presents with left testicle pain.  Patient reports that he has had left-sided groin pain for the last few days.  He reports that he lifts heavy things at work but is never had a hernia.  He reports he is unsure if this feels similar to prior epididymitis.  He reports that he has not had a difficulty with urination or bowel movements.  He denies any other abdominal pain.  No fevers, chills, chest pain, shortness of breath, cough, congestion.  On exam with a chaperone, patient had tenderness on her left testicle.  No penile tenderness.  Patient had no tenderness in the suprapubic area and no wounds seen.  No erythema.  Lungs clear chest nontender.  Abdomen nontender.  Given his history of epididymitis, suspect recurrent epididymitis.  He will have urinalysis and get an ultrasound to clarify.  No hernia was palpated on exam.  Patient has a normal cremasteric reflex but will look for torsion with ultrasound.  Anticipate reassessment after work-up.     Testicle ultrasound showed no evidence of torsion.  No hernia seen.  Patient does have evidence of hydrocele which is likely the cause of his discomfort.  Urinalysis shows no infection.  Patient will be given  prescription for pain medication and nausea medication and urology follow-up.  Patient agrees with plan of care and understands return precautions follow-up.  He has no questions or concerns and patient was discharged in good condition.    Final Clinical Impressions(s) / ED Diagnoses   Final diagnoses:  Hydrocele in adult  Testicle pain    ED Discharge Orders  Ordered    oxyCODONE-acetaminophen (PERCOCET/ROXICET) 5-325 MG tablet  Every 4 hours PRN     05/27/18 2201    ondansetron (ZOFRAN) 4 MG tablet  Every 6 hours     05/27/18 2201          Clinical Impression: 1. Hydrocele in adult   2. Testicle pain     Disposition: Discharge  Condition: Good  I have discussed the results, Dx and Tx plan with the pt(& family if present). He/she/they expressed understanding and agree(s) with the plan. Discharge instructions discussed at great length. Strict return precautions discussed and pt &/or family have verbalized understanding of the instructions. No further questions at time of discharge.    New Prescriptions   ONDANSETRON (ZOFRAN) 4 MG TABLET    Take 1 tablet (4 mg total) by mouth every 6 (six) hours.   OXYCODONE-ACETAMINOPHEN (PERCOCET/ROXICET) 5-325 MG TABLET    Take 1 tablet by mouth every 4 (four) hours as needed for severe pain.    Follow Up: [facility] [address] [place] [address] [phone]        Karcyn Menn, Canary Brim, MD 05/27/18 2203

## 2018-05-27 NOTE — Discharge Instructions (Signed)
The ultrasound today showed no evidence of infection, torsion, or mass.  However we did find evidence of a hydrocele.  I suspect this is the cause of your pain.  If the symptoms do not improve as we discussed with management, please follow-up with urology.  Please use the pain medicine nausea medicine to help maintain hydration and help with discomfort.  Please use a scrotal support as we discussed.  If any symptoms change or worsen, please return to the nearest emergency department.

## 2018-05-29 LAB — URINE CULTURE: Culture: NO GROWTH

## 2018-09-18 ENCOUNTER — Other Ambulatory Visit: Payer: Self-pay

## 2018-09-18 ENCOUNTER — Emergency Department (HOSPITAL_COMMUNITY): Payer: 59

## 2018-09-18 ENCOUNTER — Inpatient Hospital Stay (HOSPITAL_COMMUNITY)
Admission: EM | Admit: 2018-09-18 | Discharge: 2018-09-22 | DRG: 340 | Disposition: A | Discharge status: Home / Self Care | Payer: 59 | Attending: Surgery | Admitting: Surgery

## 2018-09-18 ENCOUNTER — Emergency Department (HOSPITAL_COMMUNITY): Payer: 59 | Admitting: Anesthesiology

## 2018-09-18 ENCOUNTER — Encounter (HOSPITAL_COMMUNITY): Payer: Self-pay

## 2018-09-18 ENCOUNTER — Encounter (HOSPITAL_COMMUNITY): Admission: EM | Disposition: A | Discharge status: Home / Self Care | Payer: Self-pay | Source: Home / Self Care

## 2018-09-18 DIAGNOSIS — F17210 Nicotine dependence, cigarettes, uncomplicated: Secondary | ICD-10-CM | POA: Diagnosis present

## 2018-09-18 DIAGNOSIS — R109 Unspecified abdominal pain: Secondary | ICD-10-CM | POA: Diagnosis present

## 2018-09-18 DIAGNOSIS — I2510 Atherosclerotic heart disease of native coronary artery without angina pectoris: Secondary | ICD-10-CM | POA: Diagnosis present

## 2018-09-18 DIAGNOSIS — K219 Gastro-esophageal reflux disease without esophagitis: Secondary | ICD-10-CM | POA: Diagnosis present

## 2018-09-18 DIAGNOSIS — K3580 Unspecified acute appendicitis: Secondary | ICD-10-CM

## 2018-09-18 DIAGNOSIS — Z6836 Body mass index (BMI) 36.0-36.9, adult: Secondary | ICD-10-CM | POA: Diagnosis not present

## 2018-09-18 DIAGNOSIS — K3532 Acute appendicitis with perforation and localized peritonitis, without abscess: Secondary | ICD-10-CM

## 2018-09-18 DIAGNOSIS — Z880 Allergy status to penicillin: Secondary | ICD-10-CM | POA: Diagnosis not present

## 2018-09-18 DIAGNOSIS — Z79899 Other long term (current) drug therapy: Secondary | ICD-10-CM | POA: Diagnosis not present

## 2018-09-18 DIAGNOSIS — Z20828 Contact with and (suspected) exposure to other viral communicable diseases: Secondary | ICD-10-CM | POA: Diagnosis present

## 2018-09-18 DIAGNOSIS — I340 Nonrheumatic mitral (valve) insufficiency: Secondary | ICD-10-CM | POA: Diagnosis present

## 2018-09-18 DIAGNOSIS — Z8249 Family history of ischemic heart disease and other diseases of the circulatory system: Secondary | ICD-10-CM | POA: Diagnosis not present

## 2018-09-18 DIAGNOSIS — E669 Obesity, unspecified: Secondary | ICD-10-CM | POA: Diagnosis present

## 2018-09-18 DIAGNOSIS — K3533 Acute appendicitis with perforation and localized peritonitis, with abscess: Secondary | ICD-10-CM | POA: Diagnosis present

## 2018-09-18 HISTORY — PX: LAPAROSCOPIC APPENDECTOMY: SHX408

## 2018-09-18 HISTORY — DX: Unspecified acute appendicitis: K35.80

## 2018-09-18 HISTORY — DX: Acute appendicitis with perforation, localized peritonitis, and gangrene, without abscess: K35.32

## 2018-09-18 LAB — COMPREHENSIVE METABOLIC PANEL
ALT: 50 U/L — ABNORMAL HIGH (ref 0–44)
AST: 30 U/L (ref 15–41)
Albumin: 3.6 g/dL (ref 3.5–5.0)
Alkaline Phosphatase: 71 U/L (ref 38–126)
Anion gap: 10 (ref 5–15)
BUN: 12 mg/dL (ref 6–20)
CO2: 25 mmol/L (ref 22–32)
Calcium: 8.6 mg/dL — ABNORMAL LOW (ref 8.9–10.3)
Chloride: 102 mmol/L (ref 98–111)
Creatinine, Ser: 1.05 mg/dL (ref 0.61–1.24)
GFR calc Af Amer: >60 mL/min (ref >60)
GFR calc non Af Amer: >60 mL/min (ref >60)
Glucose, Bld: 98 mg/dL (ref 70–99)
Potassium: 4.2 mmol/L (ref 3.5–5.1)
Sodium: 137 mmol/L (ref 135–145)
Total Bilirubin: 0.3 mg/dL (ref 0.3–1.2)
Total Protein: 8.2 g/dL — ABNORMAL HIGH (ref 6.5–8.1)

## 2018-09-18 LAB — CBC
HCT: 42.9 % (ref 39.0–52.0)
Hemoglobin: 14.1 g/dL (ref 13.0–17.0)
MCH: 30.3 pg (ref 26.0–34.0)
MCHC: 32.9 g/dL (ref 30.0–36.0)
MCV: 92.1 fL (ref 80.0–100.0)
Platelets: 420 10*3/uL — ABNORMAL HIGH (ref 150–400)
RBC: 4.66 MIL/uL (ref 4.22–5.81)
RDW: 12.1 % (ref 11.5–15.5)
WBC: 12.1 10*3/uL — ABNORMAL HIGH (ref 4.0–10.5)
nRBC: 0 % (ref 0.0–0.2)

## 2018-09-18 LAB — URINALYSIS, ROUTINE W REFLEX MICROSCOPIC
Bilirubin Urine: NEGATIVE
Glucose, UA: NEGATIVE mg/dL
Hgb urine dipstick: NEGATIVE
Ketones, ur: NEGATIVE mg/dL
Leukocytes,Ua: NEGATIVE
Nitrite: NEGATIVE
Protein, ur: NEGATIVE mg/dL
Specific Gravity, Urine: >1.046 — ABNORMAL HIGH (ref 1.005–1.030)
pH: 6 (ref 5.0–8.0)

## 2018-09-18 LAB — SARS CORONAVIRUS 2 BY RT PCR (HOSPITAL ORDER, PERFORMED IN ~~LOC~~ HOSPITAL LAB): SARS Coronavirus 2: NEGATIVE

## 2018-09-18 LAB — LIPASE, BLOOD: Lipase: 25 U/L (ref 11–51)

## 2018-09-18 IMAGING — CT CT ABD-PELV W/ CM
2 of 5 series · 16 of 46 positions shown, 18 images · IV contrast (omnipaque)
Comparison: [DATE]

CLINICAL DATA: Abdominal pain.

EXAM:
CT ABDOMEN AND PELVIS WITH CONTRAST
TECHNIQUE: Multidetector CT imaging of the abdomen and pelvis was performed
using the standard protocol following bolus administration of
intravenous contrast.
CONTRAST:  100mL OMNIPAQUE IOHEXOL 300 MG/ML  SOLN

[Series 2: axial st · axial · 0.81mm/px · z∈[+1165,+1605]mm · 13 of 102 slices shown, 15 images]
[im 7/102  soft-tissue]
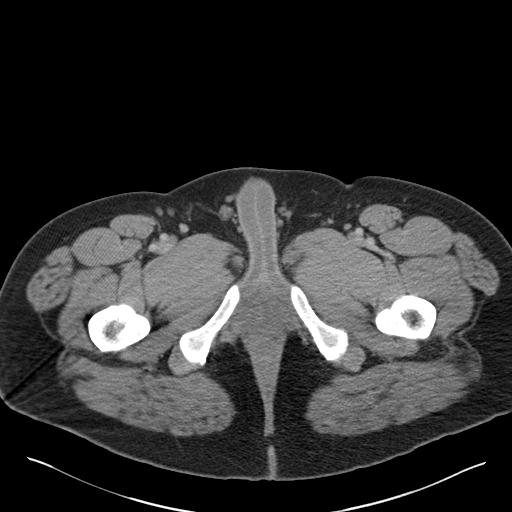
[im 7/102  bone]
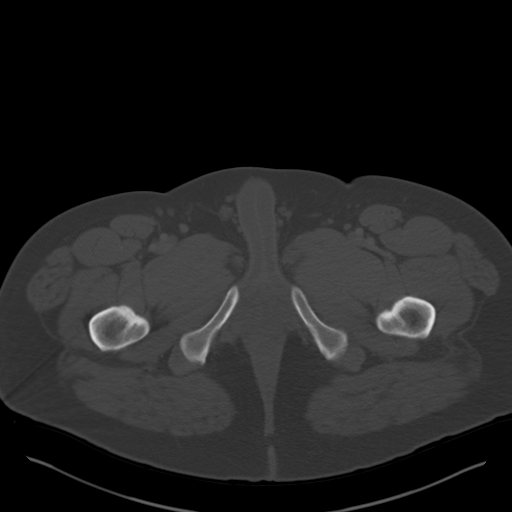
[im 14/102  soft-tissue]
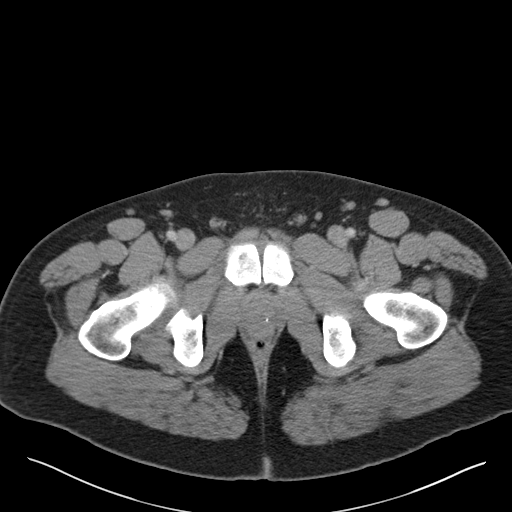
[im 21/102  soft-tissue]
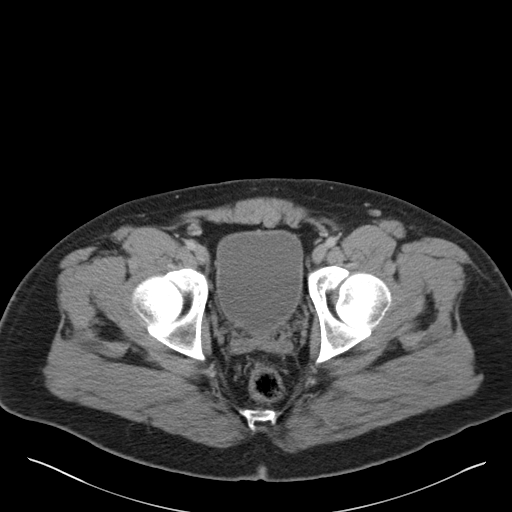
[im 27/102  soft-tissue]
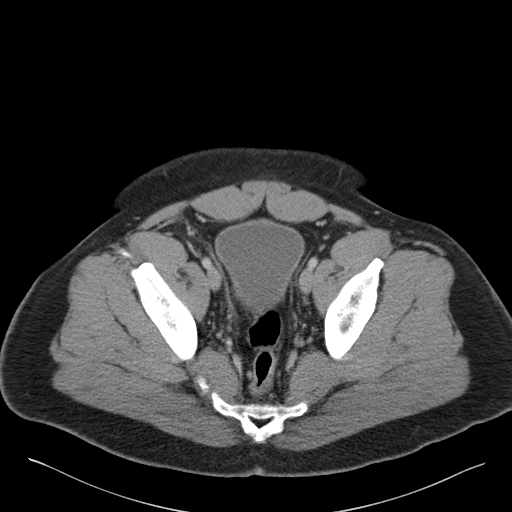
[im 34/102  soft-tissue]
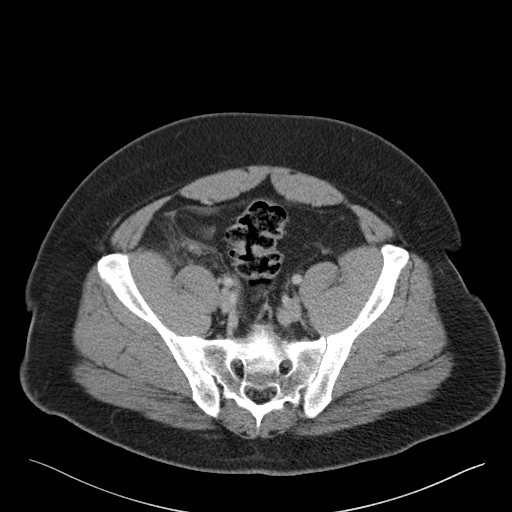
[im 41/102  soft-tissue]
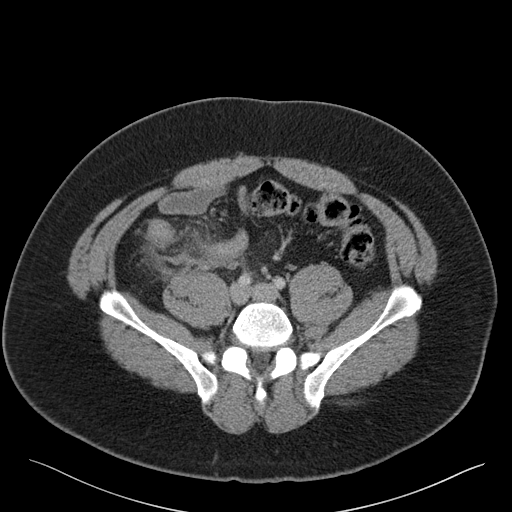
[im 54/102  soft-tissue]
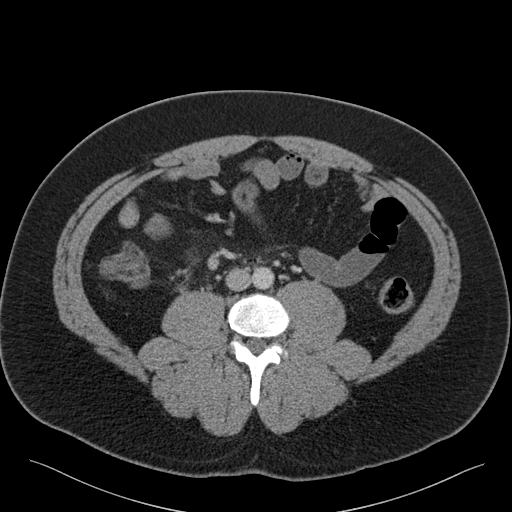
[im 61/102  soft-tissue]
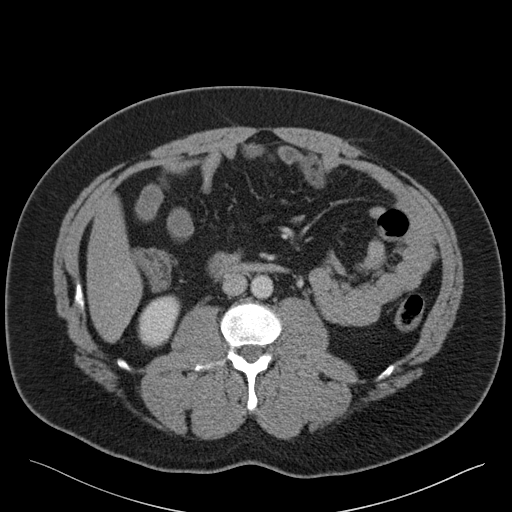
[im 68/102  soft-tissue]
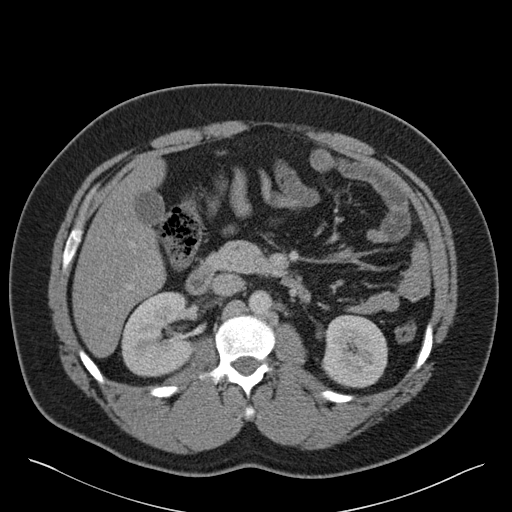
[im 68/102  bone]
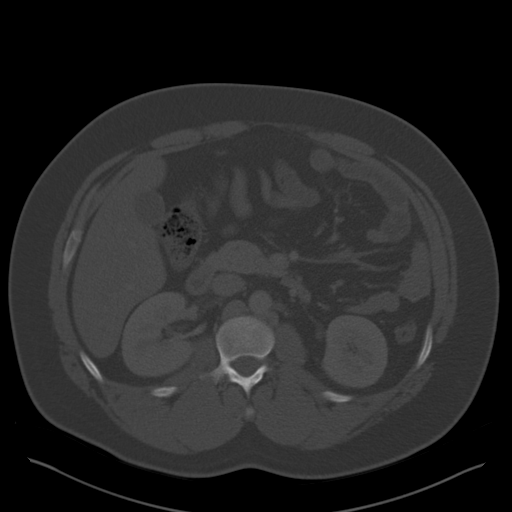
[im 75/102  soft-tissue]
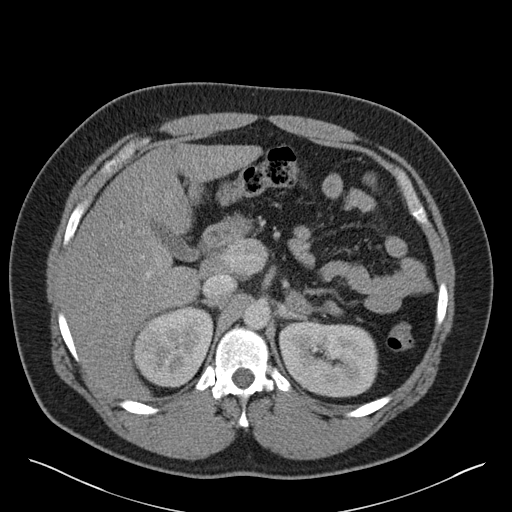
[im 81/102  soft-tissue]
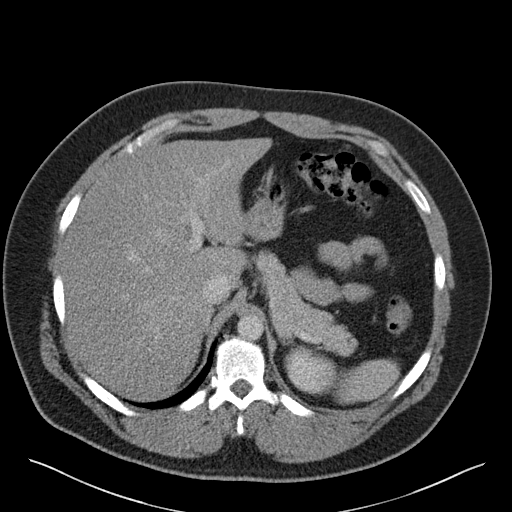
[im 88/102  soft-tissue]
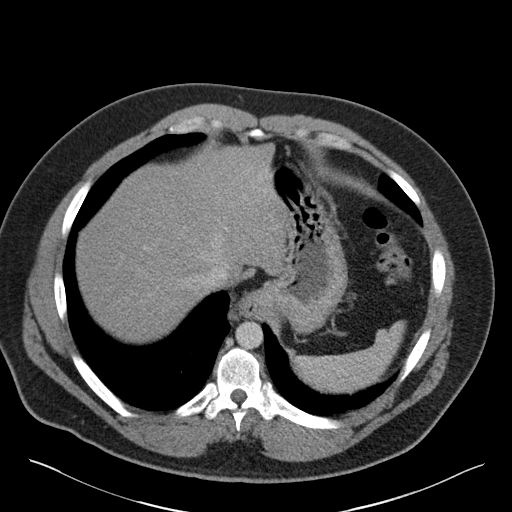
[im 95/102  soft-tissue]
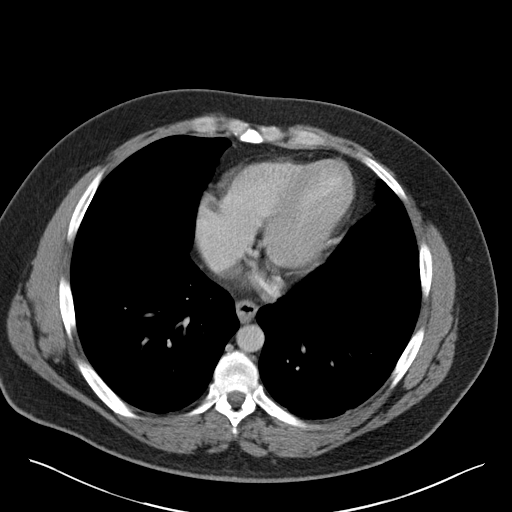

[Series 5: coronal st · coronal · 0.79mm/px · 3 of 159 slices shown]
[im 53/159  soft-tissue]
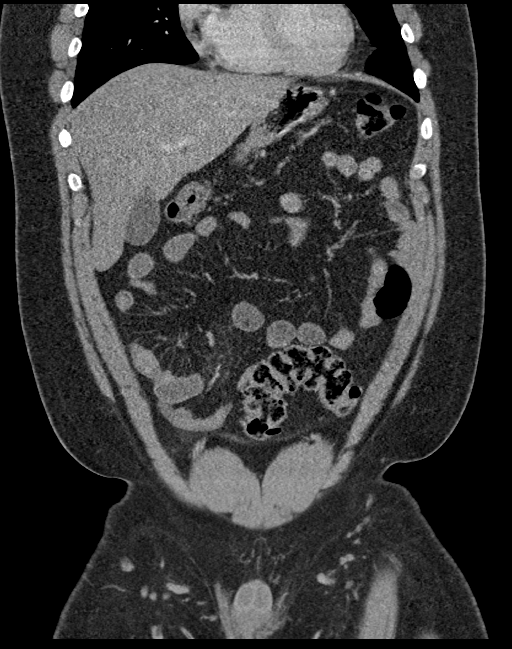
[im 71/159  soft-tissue]
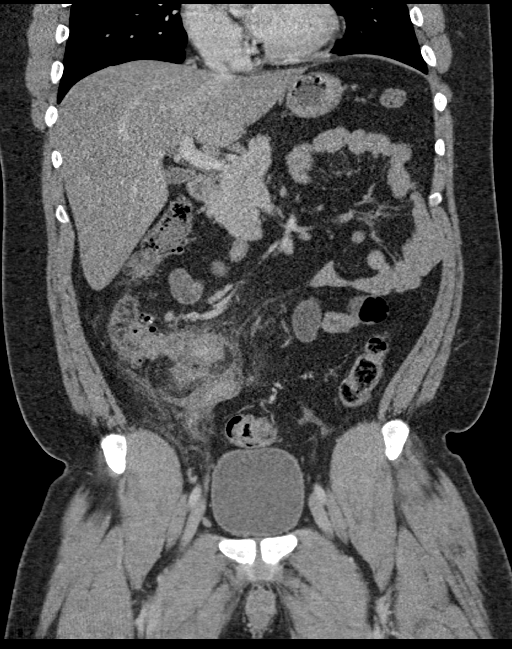
[im 88/159  soft-tissue]
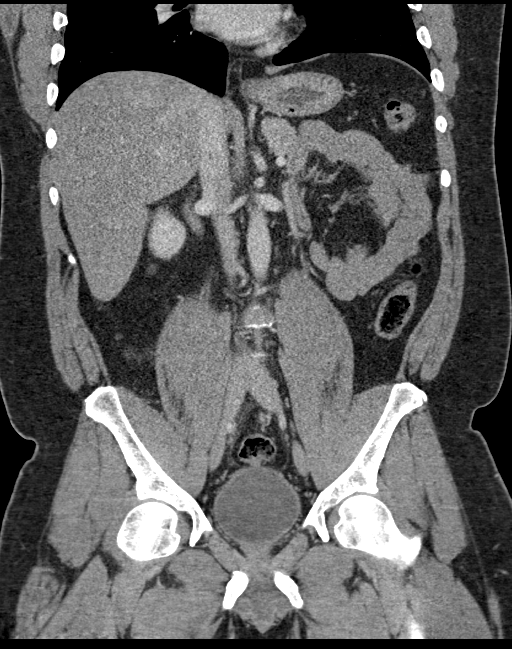

[16 of 46 positions shown; findings below may reference images not displayed]

FINDINGS: Lower chest: No acute abnormality.

Hepatobiliary: No focal liver abnormality is seen. No gallstones,
gallbladder wall thickening, or biliary dilatation.

Pancreas: Unremarkable. No pancreatic ductal dilatation or
surrounding inflammatory changes.

Spleen: Normal in size without focal abnormality.

Adrenals/Urinary Tract: Adrenal glands are unremarkable. Kidneys are
normal, without renal calculi, focal lesion, or hydronephrosis.
Bladder is unremarkable.

Stomach/Bowel: Normal appearance of the stomach and proximal small
bowel. The appendix is thickened to 1.3 cm with indistinct borders.
Marked periappendiceal fat stranding. Small amount of fluid is seen
dependently at the base of the appendix. Fluid is seen tracking into
the right pelvis. Short segment of the terminal ileum demonstrates
inflammatory changes, likely reactive. Numerous likely reactive
rounded mesenteric lymph nodes in the right lower quadrant of the
abdomen. Scattered right colonic diverticulosis.

Vascular/Lymphatic: Mild calcific atherosclerotic disease of the
abdominal aorta.

Reproductive: Prostate is unremarkable.

Other: No abdominal wall hernia or abnormality. No abdominopelvic
ascites.

Musculoskeletal: No acute or significant osseous findings.
IMPRESSION: 1. Acute appendicitis with marked periappendiceal fat stranding and
small amount of fluid tracking into the right pelvis. Micro rupture
cannot be excluded with this appearance. No organized abscess seen.
2. Numerous likely reactive rounded mesenteric lymph nodes in the
right lower quadrant of the abdomen.
3. Right colonic diverticulosis without evidence of diverticulitis.
4. Mild calcific atherosclerotic disease of the abdominal aorta.

## 2018-09-18 SURGERY — APPENDECTOMY, LAPAROSCOPIC
Anesthesia: General

## 2018-09-18 MED ORDER — ONDANSETRON HCL 4 MG/2ML IJ SOLN
INTRAMUSCULAR | Status: AC
  Filled 2018-09-18: qty 2

## 2018-09-18 MED ORDER — METRONIDAZOLE IN NACL 5-0.79 MG/ML-% IV SOLN
500.0000 mg | Freq: Once | INTRAVENOUS | Status: AC
  Administered 2018-09-18: 500 mg via INTRAVENOUS
  Filled 2018-09-18 (×2): qty 100

## 2018-09-18 MED ORDER — FENTANYL CITRATE (PF) 250 MCG/5ML IJ SOLN
INTRAMUSCULAR | Status: AC
  Filled 2018-09-18: qty 5

## 2018-09-18 MED ORDER — POLYETHYLENE GLYCOL 3350 17 G PO PACK: 17.0000 g | PACK | Freq: Every day | ORAL | Status: DC | PRN

## 2018-09-18 MED ORDER — PROPOFOL 10 MG/ML IV BOLUS
INTRAVENOUS | Status: AC
  Filled 2018-09-18: qty 20

## 2018-09-18 MED ORDER — NICOTINE 14 MG/24HR TD PT24
14.0000 mg | MEDICATED_PATCH | Freq: Every day | TRANSDERMAL | Status: DC
  Administered 2018-09-18 – 2018-09-22 (×5): 14 mg via TRANSDERMAL
  Filled 2018-09-18 (×5): qty 1

## 2018-09-18 MED ORDER — GABAPENTIN 300 MG PO CAPS
300.0000 mg | ORAL_CAPSULE | Freq: Two times a day (BID) | ORAL | Status: DC
  Administered 2018-09-18 – 2018-09-22 (×8): 300 mg via ORAL
  Filled 2018-09-18 (×8): qty 1

## 2018-09-18 MED ORDER — MIDAZOLAM HCL 5 MG/5ML IJ SOLN
INTRAMUSCULAR | Status: DC | PRN
  Administered 2018-09-18: 2 mg via INTRAVENOUS

## 2018-09-18 MED ORDER — PANTOPRAZOLE SODIUM 40 MG IV SOLR
40.0000 mg | Freq: Every day | INTRAVENOUS | Status: DC
  Administered 2018-09-18 – 2018-09-21 (×4): 40 mg via INTRAVENOUS
  Filled 2018-09-18 (×4): qty 40

## 2018-09-18 MED ORDER — METOPROLOL TARTRATE 5 MG/5ML IV SOLN: 5.0000 mg | Freq: Four times a day (QID) | INTRAVENOUS | Status: DC | PRN

## 2018-09-18 MED ORDER — IOHEXOL 300 MG/ML  SOLN
100.0000 mL | Freq: Once | INTRAMUSCULAR | Status: AC | PRN
  Administered 2018-09-18: 100 mL via INTRAVENOUS

## 2018-09-18 MED ORDER — FENTANYL CITRATE (PF) 100 MCG/2ML IJ SOLN
25.0000 ug | INTRAMUSCULAR | Status: DC | PRN
  Administered 2018-09-18 (×2): 50 ug via INTRAVENOUS

## 2018-09-18 MED ORDER — LACTATED RINGERS IV SOLN
INTRAVENOUS | Status: DC
  Administered 2018-09-18 (×2): via INTRAVENOUS

## 2018-09-18 MED ORDER — ONDANSETRON 4 MG PO TBDP
4.0000 mg | ORAL_TABLET | Freq: Four times a day (QID) | ORAL | Status: DC | PRN
  Administered 2018-09-20: 09:00:00 4 mg via ORAL
  Filled 2018-09-18: qty 1

## 2018-09-18 MED ORDER — METHOCARBAMOL 500 MG PO TABS
500.0000 mg | ORAL_TABLET | Freq: Four times a day (QID) | ORAL | Status: DC | PRN
  Administered 2018-09-18 – 2018-09-20 (×2): 500 mg via ORAL
  Filled 2018-09-18 (×2): qty 1

## 2018-09-18 MED ORDER — DIAZEPAM 5 MG PO TABS
5.0000 mg | ORAL_TABLET | Freq: Two times a day (BID) | ORAL | Status: DC
  Administered 2018-09-18 – 2018-09-22 (×8): 5 mg via ORAL
  Filled 2018-09-18 (×8): qty 1

## 2018-09-18 MED ORDER — LIDOCAINE 2% (20 MG/ML) 5 ML SYRINGE
INTRAMUSCULAR | Status: DC | PRN
  Administered 2018-09-18: 80 mg via INTRAVENOUS

## 2018-09-18 MED ORDER — OXYCODONE HCL 5 MG/5ML PO SOLN: 5.0000 mg | Freq: Once | ORAL | Status: DC | PRN

## 2018-09-18 MED ORDER — ONDANSETRON HCL 4 MG/2ML IJ SOLN: 4.0000 mg | Freq: Four times a day (QID) | INTRAMUSCULAR | Status: DC | PRN

## 2018-09-18 MED ORDER — BUPIVACAINE-EPINEPHRINE (PF) 0.5% -1:200000 IJ SOLN
INTRAMUSCULAR | Status: AC
  Filled 2018-09-18: qty 30

## 2018-09-18 MED ORDER — OXYCODONE HCL 5 MG PO TABS: 5.0000 mg | ORAL_TABLET | Freq: Once | ORAL | Status: DC | PRN

## 2018-09-18 MED ORDER — SODIUM CHLORIDE 0.9% FLUSH: 3.0000 mL | Freq: Once | INTRAVENOUS | Status: DC

## 2018-09-18 MED ORDER — METRONIDAZOLE IN NACL 5-0.79 MG/ML-% IV SOLN
500.0000 mg | Freq: Three times a day (TID) | INTRAVENOUS | Status: DC
  Administered 2018-09-19 – 2018-09-21 (×7): 500 mg via INTRAVENOUS
  Filled 2018-09-18 (×8): qty 100

## 2018-09-18 MED ORDER — SIMETHICONE 80 MG PO CHEW
40.0000 mg | CHEWABLE_TABLET | Freq: Four times a day (QID) | ORAL | Status: DC | PRN
  Administered 2018-09-20: 40 mg via ORAL
  Filled 2018-09-18: qty 1

## 2018-09-18 MED ORDER — PROPOFOL 10 MG/ML IV BOLUS
INTRAVENOUS | Status: AC
  Filled 2018-09-18: qty 40

## 2018-09-18 MED ORDER — SODIUM CHLORIDE 0.9 % IV BOLUS
1000.0000 mL | Freq: Once | INTRAVENOUS | Status: AC
  Administered 2018-09-18: 1000 mL via INTRAVENOUS

## 2018-09-18 MED ORDER — BUPIVACAINE-EPINEPHRINE 0.25% -1:200000 IJ SOLN
INTRAMUSCULAR | Status: DC | PRN
  Administered 2018-09-18: 20 mL

## 2018-09-18 MED ORDER — CIPROFLOXACIN IN D5W 400 MG/200ML IV SOLN
400.0000 mg | Freq: Two times a day (BID) | INTRAVENOUS | Status: DC
  Administered 2018-09-19 – 2018-09-21 (×5): 400 mg via INTRAVENOUS
  Filled 2018-09-18 (×5): qty 200

## 2018-09-18 MED ORDER — ONDANSETRON HCL 4 MG/2ML IJ SOLN
INTRAMUSCULAR | Status: DC | PRN
  Administered 2018-09-18: 4 mg via INTRAVENOUS

## 2018-09-18 MED ORDER — HYDROCODONE-ACETAMINOPHEN 5-325 MG PO TABS
1.0000 | ORAL_TABLET | ORAL | Status: DC | PRN
  Administered 2018-09-18 – 2018-09-21 (×6): 2 via ORAL
  Filled 2018-09-18 (×7): qty 2

## 2018-09-18 MED ORDER — OXYCODONE HCL 5 MG PO TABS: 5.0000 mg | ORAL_TABLET | ORAL | Status: DC | PRN

## 2018-09-18 MED ORDER — HYDRALAZINE HCL 20 MG/ML IJ SOLN: 10.0000 mg | INTRAMUSCULAR | Status: DC | PRN

## 2018-09-18 MED ORDER — BUPIVACAINE LIPOSOME 1.3 % IJ SUSP
20.0000 mL | Freq: Once | INTRAMUSCULAR | Status: DC
  Filled 2018-09-18: qty 20

## 2018-09-18 MED ORDER — MORPHINE SULFATE (PF) 2 MG/ML IV SOLN
1.0000 mg | INTRAVENOUS | Status: DC | PRN
  Administered 2018-09-19 (×2): 1 mg via INTRAVENOUS
  Filled 2018-09-18 (×2): qty 1

## 2018-09-18 MED ORDER — ACETAMINOPHEN 325 MG PO TABS
650.0000 mg | ORAL_TABLET | Freq: Four times a day (QID) | ORAL | Status: DC | PRN
  Administered 2018-09-18 – 2018-09-19 (×3): 650 mg via ORAL
  Filled 2018-09-18 (×3): qty 2

## 2018-09-18 MED ORDER — SUGAMMADEX SODIUM 200 MG/2ML IV SOLN
INTRAVENOUS | Status: DC | PRN
  Administered 2018-09-18: 500 mg via INTRAVENOUS

## 2018-09-18 MED ORDER — DOCUSATE SODIUM 100 MG PO CAPS
100.0000 mg | ORAL_CAPSULE | Freq: Two times a day (BID) | ORAL | Status: DC
  Administered 2018-09-18 – 2018-09-22 (×8): 100 mg via ORAL
  Filled 2018-09-18 (×8): qty 1

## 2018-09-18 MED ORDER — DIPHENHYDRAMINE HCL 25 MG PO CAPS: 25.0000 mg | ORAL_CAPSULE | Freq: Four times a day (QID) | ORAL | Status: DC | PRN

## 2018-09-18 MED ORDER — CIPROFLOXACIN IN D5W 400 MG/200ML IV SOLN
400.0000 mg | Freq: Once | INTRAVENOUS | Status: AC
  Administered 2018-09-18: 400 mg via INTRAVENOUS
  Filled 2018-09-18: qty 200

## 2018-09-18 MED ORDER — ONDANSETRON 4 MG PO TBDP: 4.0000 mg | ORAL_TABLET | Freq: Four times a day (QID) | ORAL | Status: DC | PRN

## 2018-09-18 MED ORDER — KCL IN DEXTROSE-NACL 20-5-0.45 MEQ/L-%-% IV SOLN
INTRAVENOUS | Status: DC
  Administered 2018-09-18 – 2018-09-19 (×2): via INTRAVENOUS
  Filled 2018-09-18 (×2): qty 1000

## 2018-09-18 MED ORDER — MORPHINE SULFATE (PF) 2 MG/ML IV SOLN: 2.0000 mg | INTRAVENOUS | Status: DC | PRN

## 2018-09-18 MED ORDER — ENOXAPARIN SODIUM 40 MG/0.4ML ~~LOC~~ SOLN
40.0000 mg | SUBCUTANEOUS | Status: DC
  Administered 2018-09-19 – 2018-09-22 (×4): 40 mg via SUBCUTANEOUS
  Filled 2018-09-18 (×4): qty 0.4

## 2018-09-18 MED ORDER — SODIUM CHLORIDE 0.9 % IV SOLN
INTRAVENOUS | Status: DC | PRN
  Administered 2018-09-18: 14:00:00 via INTRAVENOUS

## 2018-09-18 MED ORDER — DIPHENHYDRAMINE HCL 50 MG/ML IJ SOLN: 25.0000 mg | Freq: Four times a day (QID) | INTRAMUSCULAR | Status: DC | PRN

## 2018-09-18 MED ORDER — ROCURONIUM BROMIDE 10 MG/ML (PF) SYRINGE
PREFILLED_SYRINGE | INTRAVENOUS | Status: AC
  Filled 2018-09-18: qty 10

## 2018-09-18 MED ORDER — ACETAMINOPHEN 325 MG PO TABS
650.0000 mg | ORAL_TABLET | Freq: Once | ORAL | Status: AC
  Administered 2018-09-18: 650 mg via ORAL
  Filled 2018-09-18: qty 2

## 2018-09-18 MED ORDER — SUGAMMADEX SODIUM 500 MG/5ML IV SOLN
INTRAVENOUS | Status: AC
  Filled 2018-09-18: qty 5

## 2018-09-18 MED ORDER — ONDANSETRON HCL 4 MG/2ML IJ SOLN
4.0000 mg | Freq: Four times a day (QID) | INTRAMUSCULAR | Status: DC | PRN
  Administered 2018-09-19 – 2018-09-20 (×3): 4 mg via INTRAVENOUS
  Filled 2018-09-18 (×3): qty 2

## 2018-09-18 MED ORDER — SODIUM CHLORIDE 0.45 % IV SOLN
INTRAVENOUS | Status: DC
  Administered 2018-09-19 – 2018-09-20 (×2): via INTRAVENOUS

## 2018-09-18 MED ORDER — FENTANYL CITRATE (PF) 100 MCG/2ML IJ SOLN
INTRAMUSCULAR | Status: AC
  Administered 2018-09-18: 50 ug via INTRAVENOUS
  Filled 2018-09-18: qty 2

## 2018-09-18 MED ORDER — PROPOFOL 500 MG/50ML IV EMUL
INTRAVENOUS | Status: DC | PRN
  Administered 2018-09-18: 150 ug/kg/min via INTRAVENOUS

## 2018-09-18 MED ORDER — ROCURONIUM BROMIDE 10 MG/ML (PF) SYRINGE
PREFILLED_SYRINGE | INTRAVENOUS | Status: DC | PRN
  Administered 2018-09-18: 20 mg via INTRAVENOUS
  Administered 2018-09-18: 30 mg via INTRAVENOUS
  Administered 2018-09-18: 70 mg via INTRAVENOUS

## 2018-09-18 MED ORDER — PROPOFOL 10 MG/ML IV BOLUS
INTRAVENOUS | Status: DC | PRN
  Administered 2018-09-18: 200 mg via INTRAVENOUS

## 2018-09-18 MED ORDER — MIDAZOLAM HCL 2 MG/2ML IJ SOLN
INTRAMUSCULAR | Status: AC
  Filled 2018-09-18: qty 2

## 2018-09-18 MED ORDER — PROMETHAZINE HCL 25 MG/ML IJ SOLN: 6.2500 mg | INTRAMUSCULAR | Status: DC | PRN

## 2018-09-18 MED ORDER — ACETAMINOPHEN 650 MG RE SUPP: 650.0000 mg | Freq: Four times a day (QID) | RECTAL | Status: DC | PRN

## 2018-09-18 MED ORDER — FENTANYL CITRATE (PF) 250 MCG/5ML IJ SOLN
INTRAMUSCULAR | Status: DC | PRN
  Administered 2018-09-18: 50 ug via INTRAVENOUS
  Administered 2018-09-18: 100 ug via INTRAVENOUS
  Administered 2018-09-18 (×2): 50 ug via INTRAVENOUS

## 2018-09-18 SURGICAL SUPPLY — 41 items
APPLIER CLIP ROT 10 11.4 M/L (STAPLE)
BENZOIN TINCTURE PRP APPL 2/3 (GAUZE/BANDAGES/DRESSINGS) IMPLANT
CABLE HIGH FREQUENCY MONO STRZ (ELECTRODE) ×2 IMPLANT
CHLORAPREP W/TINT 26 (MISCELLANEOUS) ×2 IMPLANT
CLIP APPLIE ROT 10 11.4 M/L (STAPLE) IMPLANT
COVER SURGICAL LIGHT HANDLE (MISCELLANEOUS) ×2 IMPLANT
COVER WAND RF STERILE (DRAPES) IMPLANT
CUTTER FLEX LINEAR 45M (STAPLE) ×2 IMPLANT
DECANTER SPIKE VIAL GLASS SM (MISCELLANEOUS) IMPLANT
DERMABOND ADVANCED (GAUZE/BANDAGES/DRESSINGS) ×1
DERMABOND ADVANCED .7 DNX12 (GAUZE/BANDAGES/DRESSINGS) ×1 IMPLANT
DRAIN CHANNEL 19F RND (DRAIN) ×2 IMPLANT
ELECT REM PT RETURN 15FT ADLT (MISCELLANEOUS) ×2 IMPLANT
ENDOLOOP SUT PDS II  0 18 (SUTURE)
ENDOLOOP SUT PDS II 0 18 (SUTURE) IMPLANT
EVACUATOR SILICONE 100CC (DRAIN) ×2 IMPLANT
GLOVE SURG SYN 7.5  E (GLOVE) ×5
GLOVE SURG SYN 7.5 E (GLOVE) ×5 IMPLANT
GOWN STRL REUS W/TWL XL LVL3 (GOWN DISPOSABLE) ×6 IMPLANT
KIT BASIN OR (CUSTOM PROCEDURE TRAY) ×2 IMPLANT
KIT TURNOVER KIT A (KITS) ×2 IMPLANT
POUCH RETRIEVAL ECOSAC 10 (ENDOMECHANICALS) ×1 IMPLANT
POUCH RETRIEVAL ECOSAC 10MM (ENDOMECHANICALS) ×1
POUCH SPECIMEN RETRIEVAL 10MM (ENDOMECHANICALS) ×2 IMPLANT
RELOAD 45 VASCULAR/THIN (ENDOMECHANICALS) IMPLANT
RELOAD STAPLE TA45 3.5 REG BLU (ENDOMECHANICALS) ×2 IMPLANT
SCISSORS LAP 5X35 DISP (ENDOMECHANICALS) ×2 IMPLANT
SET IRRIG TUBING LAPAROSCOPIC (IRRIGATION / IRRIGATOR) ×2 IMPLANT
SET TUBE SMOKE EVAC HIGH FLOW (TUBING) ×2 IMPLANT
SHEARS HARMONIC ACE PLUS 36CM (ENDOMECHANICALS) ×2 IMPLANT
SLEEVE XCEL OPT CAN 5 100 (ENDOMECHANICALS) ×2 IMPLANT
STRIP CLOSURE SKIN 1/2X4 (GAUZE/BANDAGES/DRESSINGS) IMPLANT
SUT ETHILON 2 0 PS N (SUTURE) ×2 IMPLANT
SUT MNCRL AB 4-0 PS2 18 (SUTURE) ×2 IMPLANT
SUT VIC AB 2-0 SH 18 (SUTURE) IMPLANT
SUT VICRYL 0 UR6 27IN ABS (SUTURE) ×2 IMPLANT
TOWEL OR 17X26 10 PK STRL BLUE (TOWEL DISPOSABLE) ×2 IMPLANT
TOWEL OR NON WOVEN STRL DISP B (DISPOSABLE) ×2 IMPLANT
TRAY LAPAROSCOPIC (CUSTOM PROCEDURE TRAY) ×2 IMPLANT
TROCAR BLADELESS OPT 5 100 (ENDOMECHANICALS) ×2 IMPLANT
TROCAR XCEL BLUNT TIP 100MML (ENDOMECHANICALS) ×2 IMPLANT

## 2018-09-18 NOTE — ED Notes (Signed)
Pt ambulatory from triage to room 

## 2018-09-18 NOTE — ED Notes (Signed)
Family at bedside. 

## 2018-09-18 NOTE — H&P (Addendum)
[facility] Admission Note  Kyle Silva [birth year]  [id number].    Requesting MD: Mancel Bale Chief Complaint/Reason for Consult: acute appendicitis  HPI:  Kyle Silva is a 38yo male with prior history of diverticulitis, who presented to [facility] earlier today complaining of 3-4 days of abdominal pain. States that the pain is periumbilical and in his lower abdomen. Worse with palpation and PO intake. TMAX 100.2. Denies, chills, nausea, vomiting, dysuria, diarrhea, constipation. Last BM was this morning. He tried taking oxycodone yesterday but the pain relief did not last. ED workup included CT scan which shows acute appendicitis with marked periappendiceal fat stranding and small amount of fluid tracking into the right pelvis, micro rupture cannot be excluded, no organized abscess seen; numerous likely reactive rounded mesenteric lymph nodes in the right lower quadrant of the abdomen; right colonic diverticulosis without evidence of diverticulitis. Lab work unremarkable except WBC 12.1. General surgery asked to see.  Abdominal surgical history: none Anticoagulants: none Smokes 1.5 PPD Employment: Lobbyist at [employer]   ROS: Review of Systems  Constitutional: Positive for fever. Negative for chills.  HENT: Negative.   Eyes: Negative.   Respiratory: Negative.   Cardiovascular: Negative.   Gastrointestinal: Positive for abdominal pain. Negative for constipation, diarrhea, nausea and vomiting.  Genitourinary: Negative.   Musculoskeletal: Negative.   Skin: Negative.   Neurological: Negative.    All systems reviewed and otherwise negative except for as above  Family History  Problem Relation Age of Onset  . AAA (abdominal aortic aneurysm) Mother   . Heart failure Father     Past Medical History:  Diagnosis Date  . Anginal pain (HCC) 07/13/2014   "mild"  . Diverticulitis   . Epididymitis   . Family history of adverse reaction to anesthesia     "dad had allergic reaction to anesthesia"   . GERD (gastroesophageal reflux disease)   . MI (mitral incompetence)     Past Surgical History:  Procedure Laterality Date  . CARDIAC CATHETERIZATION N/A 07/13/2014   Procedure: Left Heart Cath and Coronary Angiography;  Surgeon: Marykay Lex, MD;  Location: [facility] INVASIVE CV LAB;  Service: Cardiovascular;  Laterality: N/A;  . CYSTECTOMY Left ~ 2014   "wrist"  . CYSTECTOMY Left ~ 2014   "2 on my foot""    Social History:  reports that he has been smoking cigarettes. He has been smoking about 0.50 packs per day for the past 0.00 years. He has never used smokeless tobacco. He reports that he does not drink alcohol or use drugs.  Allergies:  Allergies  Allergen Reactions  . Penicillins Anaphylaxis    Has patient had a PCN reaction causing immediate rash, facial/tongue/throat swelling, SOB or lightheadedness with hypotension:Yes Has patient had a PCN reaction causing severe rash involving mucus membranes or skin necrosis:unsure Has patient had a PCN reaction that required hospitalization:Yes Has patient had a PCN reaction occurring within the last 10 years:Yes If all of the above answers are "NO", then may proceed with Cephalosporin use.      (Not in a hospital admission)   Prior to Admission medications   Medication Sig Start Date End Date Taking? Authorizing Provider  ciprofloxacin (CIPRO) 500 MG tablet Take 1 tablet (500 mg total) by mouth 2 (two) times daily. One po bid x 7 days Patient not taking: Reported on 05/27/2018 07/17/17   Dartha Lodge, PA-C  diazepam (VALIUM) 5 MG tablet Take 1 tablet (5 mg total) by mouth 2 (two)  times daily. Patient not taking: Reported on 02/08/2016 12/08/15   Derwood KaplanNanavati, Ankit, MD  doxycycline (VIBRAMYCIN) 100 MG capsule Take 1 capsule (100 mg total) by mouth 2 (two) times daily. One po bid x 7 days Patient not taking: Reported on 07/16/2017 09/07/16   Melene PlanFloyd, Dan, DO  naproxen (NAPROSYN) 500 MG tablet Take  1 tablet (500 mg total) by mouth 2 (two) times daily with a meal. Patient not taking: Reported on 02/08/2016 12/08/15   Derwood KaplanNanavati, Ankit, MD  ondansetron (ZOFRAN ODT) 4 MG disintegrating tablet 4mg  ODT q4 hours prn nausea/vomit Patient not taking: Reported on 05/27/2018 07/17/17   Dartha LodgeFord, Kelsey N, PA-C  ondansetron (ZOFRAN) 4 MG tablet Take 1 tablet (4 mg total) by mouth every 6 (six) hours. Patient not taking: Reported on 09/18/2018 05/27/18   Tegeler, Canary Brimhristopher J, MD  oxyCODONE-acetaminophen (PERCOCET) 5-325 MG tablet Take 1 tablet by mouth every 6 (six) hours as needed. Patient not taking: Reported on 05/27/2018 07/17/17   Dartha LodgeFord, Kelsey N, PA-C  oxyCODONE-acetaminophen (PERCOCET/ROXICET) 5-325 MG tablet Take 1 tablet by mouth every 4 (four) hours as needed for severe pain. Patient not taking: Reported on 09/18/2018 05/27/18   Tegeler, Canary Brimhristopher J, MD  pantoprazole (PROTONIX) 40 MG tablet Take 1 tablet (40 mg total) by mouth daily. Patient not taking: Reported on 05/27/2018 07/14/14   Azalee CourseMeng, Hao, PA  polyethylene glycol [facility](MIRALAX) packet Take 17 g by mouth daily. Patient not taking: Reported on 02/08/2016 08/30/15   Benjiman CorePickering, Nathan, MD  promethazine (PHENERGAN) 25 MG tablet Take 1 tablet (25 mg total) by mouth every 6 (six) hours as needed for nausea or vomiting. Patient not taking: Reported on 02/08/2016 08/30/15   Benjiman CorePickering, Nathan, MD  traMADol (ULTRAM) 50 MG tablet Take 0.5-1 tablets (25-50 mg total) by mouth every 6 (six) hours as needed. Patient not taking: Reported on 02/08/2016 08/31/15   Leatha GildingGherghe, Costin M, MD    Blood pressure 117/85, pulse 82, temperature 100.2 F (37.9 C), temperature source Oral, resp. rate 18, height 5\' 10"  (1.778 m), weight 115.7 kg, SpO2 100 %. Physical Exam: General: pleasant, WD/WN AA male who is laying in bed in NAD HEENT: head is normocephalic, atraumatic.  Sclera are noninjected.  Pupils equal and round.  Ears and nose without any masses or lesions.  Mouth is pink and  moist. Dentition fair Heart: regular, rate, and rhythm.  No obvious murmurs, gallops, or rubs noted.  Palpable pedal pulses bilaterally Lungs: CTAB, no wheezes, rhonchi, or rales noted.  Respiratory effort nonlabored Abd: obese, soft, nondistended, +BS, no masses, hernias, or organomegaly. Generalized abdominal tenderness with guarding, no peritonitis MS: all 4 extremities are symmetrical with no cyanosis, clubbing, or edema. Skin: warm and dry with no masses, lesions, or rashes Psych: A&Ox3 with an appropriate affect. Neuro: cranial nerves grossly intact, extremity CSM intact bilaterally, normal speech  Results for orders placed or performed during the hospital encounter of 09/18/18 (from the past 48 hour(s))  Lipase, blood     Status: None   Collection Time: 09/18/18 11:13 AM  Result Value Ref Range   Lipase 25 11 - 51 U/L    Comment: Performed at [facility], [address] [place], [place] [address]  Comprehensive metabolic panel     Status: Abnormal   Collection Time: 09/18/18 11:13 AM  Result Value Ref Range   Sodium 137 135 - 145 mmol/L   Potassium 4.2 3.5 - 5.1 mmol/L   Chloride 102 98 - 111 mmol/L   CO2 25  22 - 32 mmol/L   Glucose, Bld 98 70 - 99 mg/dL   BUN 12 6 - 20 mg/dL   Creatinine, Ser 1.611.05 0.61 - 1.24 mg/dL   Calcium 8.6 (L) 8.9 - 10.3 mg/dL   Total Protein 8.2 (H) 6.5 - 8.1 g/dL   Albumin 3.6 3.5 - 5.0 g/dL   AST 30 15 - 41 U/L   ALT 50 (H) 0 - 44 U/L   Alkaline Phosphatase 71 38 - 126 U/L   Total Bilirubin 0.3 0.3 - 1.2 mg/dL   GFR calc non Af Amer >60 >60 mL/min   GFR calc Af Amer >60 >60 mL/min   Anion gap 10 5 - 15    Comment: Performed at [facility], [address] [place], [place] [address]  CBC     Status: Abnormal   Collection Time: 09/18/18 11:13 AM  Result Value Ref Range   WBC 12.1 (H) 4.0 - 10.5 K/uL   RBC 4.66 4.22 - 5.81 MIL/uL   Hemoglobin 14.1 13.0 - 17.0 g/dL   HCT 54.042.9 98.139.0 - 19.152.0 %   MCV 92.1  80.0 - 100.0 fL   MCH 30.3 26.0 - 34.0 pg   MCHC 32.9 30.0 - 36.0 g/dL   RDW 47.812.1 29.511.5 - 62.115.5 %   Platelets 420 (H) 150 - 400 K/uL   nRBC 0.0 0.0 - 0.2 %    Comment: Performed at [facility], [address] [place], [place] [address]  Urinalysis, Routine w reflex microscopic     Status: Abnormal   Collection Time: 09/18/18  1:27 PM  Result Value Ref Range   Color, Urine STRAW (A) YELLOW   APPearance CLEAR CLEAR   Specific Gravity, Urine >1.046 (H) 1.005 - 1.030   pH 6.0 5.0 - 8.0   Glucose, UA NEGATIVE NEGATIVE mg/dL   Hgb urine dipstick NEGATIVE NEGATIVE   Bilirubin Urine NEGATIVE NEGATIVE   Ketones, ur NEGATIVE NEGATIVE mg/dL   Protein, ur NEGATIVE NEGATIVE mg/dL   Nitrite NEGATIVE NEGATIVE   Leukocytes,Ua NEGATIVE NEGATIVE    Comment: Performed at [facility], [address] [place], [place] [address]   Ct Abdomen Pelvis W Contrast  Result Date: 09/18/2018 CLINICAL DATA:  Abdominal pain. EXAM: CT ABDOMEN AND PELVIS WITH CONTRAST TECHNIQUE: Multidetector CT imaging of the abdomen and pelvis was performed using the standard protocol following bolus administration of intravenous contrast. CONTRAST:  100mL OMNIPAQUE IOHEXOL 300 MG/ML  SOLN COMPARISON:  July 16, 2017 FINDINGS: Lower chest: No acute abnormality. Hepatobiliary: No focal liver abnormality is seen. No gallstones, gallbladder wall thickening, or biliary dilatation. Pancreas: Unremarkable. No pancreatic ductal dilatation or surrounding inflammatory changes. Spleen: Normal in size without focal abnormality. Adrenals/Urinary Tract: Adrenal glands are unremarkable. Kidneys are normal, without renal calculi, focal lesion, or hydronephrosis. Bladder is unremarkable. Stomach/Bowel: Normal appearance of the stomach and proximal small bowel. The appendix is thickened to 1.3 cm with indistinct borders. Marked periappendiceal fat stranding. Small amount of fluid is seen dependently at the base of the  appendix. Fluid is seen tracking into the right pelvis. Short segment of the terminal ileum demonstrates inflammatory changes, likely reactive. Numerous likely reactive rounded mesenteric lymph nodes in the right lower quadrant of the abdomen. Scattered right colonic diverticulosis. Vascular/Lymphatic: Mild calcific atherosclerotic disease of the abdominal aorta. Reproductive: Prostate is unremarkable. Other: No abdominal wall hernia or abnormality. No abdominopelvic ascites. Musculoskeletal: No acute or significant osseous findings. IMPRESSION: 1. Acute appendicitis with marked periappendiceal fat  stranding and small amount of fluid tracking into the right pelvis. Micro rupture cannot be excluded with this appearance. No organized abscess seen. 2. Numerous likely reactive rounded mesenteric lymph nodes in the right lower quadrant of the abdomen. 3. Right colonic diverticulosis without evidence of diverticulitis. 4. 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 Electronically Signed   By: Fidela Salisbury M.D.   On: 09/18/2018 13:01      Assessment/Plan Tobacco abuse  Acute appendicitis - Patient with 3-4 days of abdominal pain, leukocytosis, and CT scan findings consistent with acute appendicitis, no abscess seen. Will admit and plan for laparoscopic appendectomy today. IV cipro/flagyl have been ordered. Keep NPO.  Covid test pending.   Wellington Hampshire, [facility] 09/18/2018, 2:02 PM Pager: [phone] Mon-Thurs 7:00 am-4:30 pm Fri 7:00 am -11:30 AM Sat-Sun 7:00 am-11:30 am

## 2018-09-18 NOTE — Interval H&P Note (Signed)
History and Physical Interval Note:  09/18/2018 4:07 PM  Kyle Silva  has presented today for surgery, with the diagnosis of acute appendicitis.  The various methods of treatment have been discussed with the patient and family. After consideration of risks, benefits and other options for treatment, the patient has consented to  Procedure(s): APPENDECTOMY LAPAROSCOPIC (N/A) as a surgical intervention.  The patient's history has been reviewed, patient examined, no change in status, stable for surgery.  I have reviewed the patient's chart and labs.  Questions were answered to the patient's satisfaction.     Ahaan Zobrist B Kavari Parrillo   

## 2018-09-18 NOTE — Op Note (Signed)
Kyle Silva  03-20-1980 10 Sept 2020    PCP:  Patient, No Pcp Per   Surgeon: Kaylyn Lim, MD, FACS  Asst:  None  Anes:  General  Preop Dx: Acute appendicitis Postop Dx: Acute ruptured walled off appendicitis with abscess and phlegmon  Procedure: Laparoscopic appendectomy and drainage of abscess Location Surgery: [place] [facility] OR #4 Complications: None  EBL:   30 cc  Drains: 19 Blake drain along the right gutter and in the appendiceal bed  Description of Procedure:  The patient was taken to OR 4.  After anesthesia was administered and the patient was prepped  with Chloroprep and a timeout was performed.  Access achieved with a Hassan technique through the umbilicus without difficulty.  The patient is obese.  Two 5 mm ports were placed in each of the right mid abdomen and the left lower quadrant.  The terminal ileum was plaster over a hard mass in the right lower quadrant that was quite fixed.    I began with blunt dissection with the irrigation Nizhat tip and the Prestige to separate the difficult to discern parts of the phelgmon and abscess that I encountered.  I entered an absess and controlled the drainage with the suction.  There was a hard appendiceal mass and a small tip or base that I transected with the stapler.  The pieces of appendix were removed via a bag and were rock hard and were sent to pathology.  The remnants of this abscess cavity contained unidentifiable tissue.  The illeum appeared intact and was still affixed to the abscess and cecum.  The area was irrigated completely and a 19 Blake drain was placed in the abscess cavity and along the right gutter exiting the abdomen through the 5 mm port.  The umbilical port was closed using Kocher clamp on each side of the fascia and placing 0 Vicryl through it.  The closure was inspected and the bed was inspected and was hemostatic.  The ports were injected with Exparel and the wounds were closed with 4-0 moncryl and the drain affixed  with 2-0 nylon.    The patient tolerated the procedure well and was taken to the PACU in stable condition.     Matt B. Hassell Done, [place], [facility], [place]

## 2018-09-18 NOTE — Transfer of Care (Signed)
Immediate Anesthesia Transfer of Care Note  Patient: Kyle Silva  Procedure(s) Performed: APPENDECTOMY LAPAROSCOPIC (N/A )  Patient Location: PACU  Anesthesia Type:General  Level of Consciousness: drowsy  Airway & Oxygen Therapy: Patient Spontanous Breathing and Patient connected to face mask  Post-op Assessment: Report given to RN and Post -op Vital signs reviewed and stable  Post vital signs: Reviewed and stable  Last Vitals:  Vitals Value Taken Time  BP 132/78 09/18/18 1819  Temp    Pulse 87 09/18/18 1821  Resp 29 09/18/18 1821  SpO2 92 % 09/18/18 1821  Vitals shown include unvalidated device data.  Last Pain:  Vitals:   09/18/18 1555  TempSrc:   PainSc: 8       Patients Stated Pain Goal: 4 (80/99/83 3825)  Complications: No apparent anesthesia complications

## 2018-09-18 NOTE — Anesthesia Preprocedure Evaluation (Addendum)
Anesthesia Evaluation  Patient identified by MRN, date of birth, ID band Patient awake    Reviewed: Allergy & Precautions, NPO status , Patient's Chart, lab work & pertinent test results  History of Anesthesia Complications (+) Family history of anesthesia reaction and history of anesthetic complications  Airway Mallampati: I  TM Distance: >3 FB Neck ROM: Full    Dental  (+) Dental Advisory Given, Teeth Intact   Pulmonary Current SmokerPatient did not abstain from smoking.,    Pulmonary exam normal        Cardiovascular negative cardio ROS Normal cardiovascular exam   '16 Cath - Mid LAD to Dist LAD lesion, 40% stenosed.  '16 TTE - normal    Neuro/Psych negative neurological ROS  negative psych ROS   GI/Hepatic Neg liver ROS, GERD  Controlled and Medicated, Acute appendicitis    Endo/Other   Obesity   Renal/GU negative Renal ROS     Musculoskeletal negative musculoskeletal ROS (+)   Abdominal   Peds  Hematology negative hematology ROS (+)   Anesthesia Other Findings   Reproductive/Obstetrics                            Anesthesia Physical Anesthesia Plan  ASA: II and emergent  Anesthesia Plan: General   Post-op Pain Management:    Induction: Intravenous and Rapid sequence  PONV Risk Score and Plan: 4 or greater and Treatment may vary due to age or medical condition, TIVA, Midazolam and Ondansetron  Airway Management Planned: Oral ETT  Additional Equipment: None  Intra-op Plan:   Post-operative Plan: Extubation in OR  Informed Consent: I have reviewed the patients History and Physical, chart, labs and discussed the procedure including the risks, benefits and alternatives for the proposed anesthesia with the patient or authorized representative who has indicated his/her understanding and acceptance.     Dental advisory given  Plan Discussed with: CRNA and  Anesthesiologist  Anesthesia Plan Comments: (Unclear family history in regards to anesthesia. Patient states he was told his father was "allergic to anesthesia", but does not know if it was to a specific medication, what reaction occurred, etc. Patient is not familiar with the term "malignant hyperthermia", but also can't say definitively that is not what his father experienced. Has never had a muscle biopsy. Two previous surgeries at another hospital for ganglion cysts, no records available. Patient states he was "asleep" for the surgeries, but unclear whether this was MAC vs. GA, whether any triggering agents used, etc. Will proceed with TIVA, avoiding triggering agents as a precaution due to possibility of familial MH history. MH filters to be placed on machines with machine flush.)       Anesthesia Quick Evaluation

## 2018-09-18 NOTE — Anesthesia Postprocedure Evaluation (Signed)
Anesthesia Post Note  Patient: Kyle Silva  Procedure(s) Performed: APPENDECTOMY LAPAROSCOPIC (N/A )     Patient location during evaluation: PACU Anesthesia Type: General Level of consciousness: awake and alert Pain management: pain level controlled Vital Signs Assessment: post-procedure vital signs reviewed and stable Respiratory status: spontaneous breathing, nonlabored ventilation, respiratory function stable and patient connected to nasal cannula oxygen Cardiovascular status: blood pressure returned to baseline and stable Postop Assessment: no apparent nausea or vomiting Anesthetic complications: no    Last Vitals:  Vitals:   09/18/18 1845 09/18/18 1900  BP: (!) 132/95 120/77  Pulse: 81 79  Resp: 16 20  Temp:    SpO2: 99%     Last Pain:  Vitals:   09/18/18 1900  TempSrc:   PainSc: [place]

## 2018-09-18 NOTE — ED Triage Notes (Signed)
Pt c/o generalized abdominal x a couple days.  Pain score 7/10.  Denies n/v/d.  Hx of diverticulitis.

## 2018-09-18 NOTE — Interval H&P Note (Signed)
History and Physical Interval Note:  09/18/2018 4:07 PM  Kyle Silva  has presented today for surgery, with the diagnosis of acute appendicitis.  The various methods of treatment have been discussed with the patient and family. After consideration of risks, benefits and other options for treatment, the patient has consented to  Procedure(s): APPENDECTOMY LAPAROSCOPIC (N/A) as a surgical intervention.  The patient's history has been reviewed, patient examined, no change in status, stable for surgery.  I have reviewed the patient's chart and labs.  Questions were answered to the patient's satisfaction.     Kyle Silva

## 2018-09-18 NOTE — ED Provider Notes (Signed)
[facility]-EMERGENCY DEPT Provider Note   CSN: [id number] Arrival date & time: 09/18/18  3086     History   Chief Complaint Chief Complaint  Patient presents with  . Abdominal Pain    HPI Kyle Silva is a 38 y.o. male.     38 y.o male with a PMH of GERD, Diverticulitis presents to the ED with a chief complaint abdominal pain x 1 week. Patient describes a constant throbbing sensation throughout his umbilicus region along with lower abdomen.  He reports the pain radiates from his bellybutton onto his lower abdomen.  States his pain is worse with eating red meat, no alleviating factors.  Patient did take some oxycodone yesterday with some improvement in symptoms although pain return after 4 hours.  He also endorses a mild fever with a T-max of 100 on today's arrival.  Reports his last bowel movement was this morning, does not have any previous surgical history to his abdomen.  Denies any nausea, vomiting, diarrhea, urinary symptoms or testicular complaints.  Denies any sick contacts or contacts with prior COVID-19 patients.  The history is provided by the patient and medical records.  Abdominal Pain Associated symptoms: fever   Associated symptoms: no chest pain, no chills, no cough, no diarrhea, no dysuria, no hematuria, no nausea, no shortness of breath, no sore throat and no vomiting     Past Medical History:  Diagnosis Date  . Anginal pain (HCC) 07/13/2014   "mild"  . Diverticulitis   . Epididymitis   . Family history of adverse reaction to anesthesia    "dad had allergic reaction to anesthesia"   . GERD (gastroesophageal reflux disease)   . MI (mitral incompetence)     Patient Active Problem List   Diagnosis Date Noted  . Acute diverticulitis 08/30/2015  . Current smoker 07/13/2014  . Family history of premature CAD 07/13/2014  . Obese 07/13/2014  . Unstable angina (HCC) 07/13/2014  . Chest pain     Past Surgical History:  Procedure Laterality  Date  . CARDIAC CATHETERIZATION N/A 07/13/2014   Procedure: Left Heart Cath and Coronary Angiography;  Surgeon: Marykay Lex, MD;  Location: [facility] INVASIVE CV LAB;  Service: Cardiovascular;  Laterality: N/A;  . CYSTECTOMY Left ~ 2014   "wrist"  . CYSTECTOMY Left ~ 2014   "2 on my foot""        Home Medications    Prior to Admission medications   Medication Sig Start Date End Date Taking? Authorizing Provider  aspirin EC 81 MG tablet Take 81 mg by mouth daily.    [provider]  ciprofloxacin (CIPRO) 500 MG tablet Take 1 tablet (500 mg total) by mouth 2 (two) times daily. One po bid x 7 days Patient not taking: Reported on 05/27/2018 07/17/17   Dartha Lodge, PA-C  diazepam (VALIUM) 5 MG tablet Take 1 tablet (5 mg total) by mouth 2 (two) times daily. Patient not taking: Reported on 02/08/2016 12/08/15   Derwood Kaplan, MD  doxycycline (VIBRAMYCIN) 100 MG capsule Take 1 capsule (100 mg total) by mouth 2 (two) times daily. One po bid x 7 days Patient not taking: Reported on 07/16/2017 09/07/16   Melene Plan, DO  naproxen (NAPROSYN) 500 MG tablet Take 1 tablet (500 mg total) by mouth 2 (two) times daily with a meal. Patient not taking: Reported on 02/08/2016 12/08/15   Derwood Kaplan, MD  ondansetron (ZOFRAN ODT) 4 MG disintegrating tablet 4mg  ODT q4 hours prn nausea/vomit Patient not  taking: Reported on 05/27/2018 07/17/17   Jacqlyn Larsen, PA-C  ondansetron (ZOFRAN) 4 MG tablet Take 1 tablet (4 mg total) by mouth every 6 (six) hours. 05/27/18   Tegeler, Gwenyth Allegra, MD  oxyCODONE-acetaminophen (PERCOCET) 5-325 MG tablet Take 1 tablet by mouth every 6 (six) hours as needed. Patient not taking: Reported on 05/27/2018 07/17/17   Jacqlyn Larsen, PA-C  oxyCODONE-acetaminophen (PERCOCET/ROXICET) 5-325 MG tablet Take 1 tablet by mouth every 4 (four) hours as needed for severe pain. 05/27/18   Tegeler, Gwenyth Allegra, MD  pantoprazole (PROTONIX) 40 MG tablet Take 1 tablet (40 mg total) by mouth  daily. Patient not taking: Reported on 05/27/2018 07/14/14   Almyra Deforest, PA  polyethylene glycol [facility]) packet Take 17 g by mouth daily. Patient not taking: Reported on 02/08/2016 08/30/15   Davonna Belling, MD  promethazine (PHENERGAN) 25 MG tablet Take 1 tablet (25 mg total) by mouth every 6 (six) hours as needed for nausea or vomiting. Patient not taking: Reported on 02/08/2016 08/30/15   Davonna Belling, MD  traMADol (ULTRAM) 50 MG tablet Take 0.5-1 tablets (25-50 mg total) by mouth every 6 (six) hours as needed. Patient not taking: Reported on 02/08/2016 08/31/15   Caren Griffins, MD    Family History Family History  Problem Relation Age of Onset  . AAA (abdominal aortic aneurysm) Mother   . Heart failure Father     Social History Social History   Tobacco Use  . Smoking status: Current Every Day Smoker    Packs/day: 0.50    Years: 0.00    Pack years: 0.00    Types: Cigarettes  . Smokeless tobacco: Never Used  Substance Use Topics  . Alcohol use: No  . Drug use: No     Allergies   Penicillins   Review of Systems Review of Systems  Constitutional: Positive for fever. Negative for chills.  HENT: Negative for ear pain and sore throat.   Eyes: Negative for pain and visual disturbance.  Respiratory: Negative for cough and shortness of breath.   Cardiovascular: Negative for chest pain and palpitations.  Gastrointestinal: Positive for abdominal pain. Negative for diarrhea, nausea and vomiting.  Genitourinary: Negative for difficulty urinating, dysuria and hematuria.  Musculoskeletal: Negative for arthralgias and back pain.  Skin: Negative for color change and rash.  Neurological: Negative for seizures and syncope.  All other systems reviewed and are negative.    Physical Exam Updated Vital Signs BP 117/85 (BP Location: Right Arm)   Pulse 82   Temp 100.2 F (37.9 C) (Oral)   Resp 18   Ht 5\' 10"  (1.778 m)   Wt 115.7 kg   SpO2 100%   BMI 36.59 kg/m    Physical Exam Vitals signs and nursing note reviewed.  Constitutional:      Appearance: He is well-developed. He is obese.  HENT:     Head: Normocephalic and atraumatic.  Eyes:     General: No scleral icterus.    Pupils: Pupils are equal, round, and reactive to light.  Neck:     Musculoskeletal: Normal range of motion.  Cardiovascular:     Heart sounds: Normal heart sounds.  Pulmonary:     Effort: Pulmonary effort is normal.     Breath sounds: Normal breath sounds. No wheezing.  Chest:     Chest wall: No tenderness.  Abdominal:     General: Bowel sounds are decreased. There is distension.     Palpations: Abdomen is soft.  Tenderness: There is generalized abdominal tenderness. There is guarding. There is no right CVA tenderness, left CVA tenderness or rebound.     Hernia: No hernia is present.  Musculoskeletal:        General: No tenderness or deformity.  Skin:    General: Skin is warm and dry.     Coloration: Skin is not jaundiced.  Neurological:     Mental Status: He is alert and oriented to person, place, and time.      ED Treatments / Results  Labs (all labs ordered are listed, but only abnormal results are displayed) Labs Reviewed  COMPREHENSIVE METABOLIC PANEL - Abnormal; Notable for the following components:      Result Value   Calcium 8.6 (*)    Total Protein 8.2 (*)    ALT 50 (*)    All other components within normal limits  CBC - Abnormal; Notable for the following components:   WBC 12.1 (*)    Platelets 420 (*)    All other components within normal limits  SARS CORONAVIRUS 2 (HOSPITAL ORDER, PERFORMED IN [facility])  LIPASE, BLOOD  URINALYSIS, ROUTINE W REFLEX MICROSCOPIC    EKG None  Radiology Ct Abdomen Pelvis W Contrast  Result Date: 09/18/2018 CLINICAL DATA:  Abdominal pain. EXAM: CT ABDOMEN AND PELVIS WITH CONTRAST TECHNIQUE: Multidetector CT imaging of the abdomen and pelvis was performed using the standard protocol  following bolus administration of intravenous contrast. CONTRAST:  100mL OMNIPAQUE IOHEXOL 300 MG/ML  SOLN COMPARISON:  July 16, 2017 FINDINGS: Lower chest: No acute abnormality. Hepatobiliary: No focal liver abnormality is seen. No gallstones, gallbladder wall thickening, or biliary dilatation. Pancreas: Unremarkable. No pancreatic ductal dilatation or surrounding inflammatory changes. Spleen: Normal in size without focal abnormality. Adrenals/Urinary Tract: Adrenal glands are unremarkable. Kidneys are normal, without renal calculi, focal lesion, or hydronephrosis. Bladder is unremarkable. Stomach/Bowel: Normal appearance of the stomach and proximal small bowel. The appendix is thickened to 1.3 cm with indistinct borders. Marked periappendiceal fat stranding. Small amount of fluid is seen dependently at the base of the appendix. Fluid is seen tracking into the right pelvis. Short segment of the terminal ileum demonstrates inflammatory changes, likely reactive. Numerous likely reactive rounded mesenteric lymph nodes in the right lower quadrant of the abdomen. Scattered right colonic diverticulosis. Vascular/Lymphatic: Mild calcific atherosclerotic disease of the abdominal aorta. Reproductive: Prostate is unremarkable. Other: No abdominal wall hernia or abnormality. No abdominopelvic ascites. Musculoskeletal: No acute or significant osseous findings. IMPRESSION: 1. Acute appendicitis with marked periappendiceal fat stranding and small amount of fluid tracking into the right pelvis. Micro rupture cannot be excluded with this appearance. No organized abscess seen. 2. Numerous likely reactive rounded mesenteric lymph nodes in the right lower quadrant of the abdomen. 3. Right colonic diverticulosis without evidence of diverticulitis. 4. 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 Electronically Signed   By: Ted Mcalpineobrinka  Dimitrova M.D.   On: 09/18/2018 13:01    Procedures Procedures (including critical  care time)  Medications Ordered in ED Medications  sodium chloride flush (NS) 0.9 % injection 3 mL (has no administration in time range)  ciprofloxacin (CIPRO) IVPB 400 mg (has no administration in time range)    And  metroNIDAZOLE (FLAGYL) IVPB 500 mg (has no administration in time range)  acetaminophen (TYLENOL) tablet 650 mg (650 mg Oral Given 09/18/18 1001)  sodium chloride 0.9 % bolus 1,000 mL (0 mLs Intravenous Stopped 09/18/18 1314)  iohexol (OMNIPAQUE) 300 MG/ML solution 100 mL (100 mLs  Intravenous Contrast Given 09/18/18 1233)     Initial Impression / Assessment and Plan / ED Course  I have reviewed the triage vital signs and the nursing notes.  Pertinent labs & imaging results that were available during my care of the patient were reviewed by me and considered in my medical decision making (see chart for details).       Patient with a past medical history of diverticulitis presents to the ED with complaints of generalized abdominal pain x1 week.  He arrived in the ED febrile with a temperature of 100.2, reports he has had a constant pain throughout this past week, has taken some hydrocodone for relieving pain with some improvement.  Reports a normal bowel movement this morning.  Has not had any sick contacts, denies any urinary symptoms. Differential diagnosis included but not limited to diverticulitis, appendicitis versus viral etiology. During my evaluation there is generalized tenderness throughout abdomen, more so along the lower region.Will provide him with pain medication, bolus along with CT imaging to further evaluate his condition.   CBC remarkable for mild leukocytosis at 12.1, hemoglobin is within normal limits.  CMP without any electrolyte derangement, LFTs remarkable for elevated ALT on today's visit.  Lipase level is within normal limits.  UA is currently pending processing.  CT abdomen and pelvis showed: 1. Acute appendicitis with marked periappendiceal fat stranding  and  small amount of fluid tracking into the right pelvis. Micro rupture  cannot be excluded with this appearance. No organized abscess seen.  2. Numerous likely reactive rounded mesenteric lymph nodes in the  right lower quadrant of the abdomen.  3. Right colonic diverticulosis without evidence of diverticulitis.  4. 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   Spoke to Gann ValleyBrooke PA with Dr. Daphine DeutscherMartin on call who will evaluate patient in the ED.  I have also order a COVID-19 test for patient, patient has been informed of these results and knows that he likely will go for surgery today or tomorrow.  Antibiotics have been ordered, he is allergic to penicillin with a reaction of anaphylaxis, will now treat with Cipro along with Flagyl which per general surgery is adequate at this time.  3:05 PMPatient admitted via general surgery service.    Portions of this note were generated with Scientist, clinical (histocompatibility and immunogenetics)Dragon dictation software. Dictation errors may occur despite best attempts at proofreading.  Final Clinical Impressions(s) / ED Diagnoses   Final diagnoses:  Acute appendicitis, unspecified acute appendicitis type    ED Discharge Orders    None       Claude MangesSoto, Fredrica Capano, PA-C 09/18/18 1505    Mancel BaleWentz, Elliott, MD 09/18/18 1718

## 2018-09-18 NOTE — Anesthesia Procedure Notes (Signed)
Procedure Name: Intubation Date/Time: 09/18/2018 4:28 PM Performed by: Claudia Desanctis, CRNA Pre-anesthesia Checklist: Patient identified, Emergency Drugs available, Suction available and Patient being monitored Patient Re-evaluated:Patient Re-evaluated prior to induction Oxygen Delivery Method: Circle system utilized Preoxygenation: Pre-oxygenation with 100% oxygen Induction Type: IV induction Ventilation: Mask ventilation without difficulty Laryngoscope Size: 2 and Miller Grade View: Grade I Tube type: Oral Tube size: 7.5 mm Number of attempts: 1 Airway Equipment and Method: Stylet Placement Confirmation: ETT inserted through vocal cords under direct vision,  positive ETCO2 and breath sounds checked- equal and bilateral Secured at: 21 cm Tube secured with: Tape Dental Injury: Teeth and Oropharynx as per pre-operative assessment

## 2018-09-19 ENCOUNTER — Encounter (HOSPITAL_COMMUNITY): Payer: Self-pay | Admitting: Surgery

## 2018-09-19 LAB — COMPREHENSIVE METABOLIC PANEL
ALT: 34 U/L (ref 0–44)
AST: 19 U/L (ref 15–41)
Albumin: 3 g/dL — ABNORMAL LOW (ref 3.5–5.0)
Alkaline Phosphatase: 54 U/L (ref 38–126)
Anion gap: 8 (ref 5–15)
BUN: 7 mg/dL (ref 6–20)
CO2: 28 mmol/L (ref 22–32)
Calcium: 8.4 mg/dL — ABNORMAL LOW (ref 8.9–10.3)
Chloride: 102 mmol/L (ref 98–111)
Creatinine, Ser: 0.9 mg/dL (ref 0.61–1.24)
GFR calc Af Amer: >60 mL/min (ref >60)
GFR calc non Af Amer: >60 mL/min (ref >60)
Glucose, Bld: 116 mg/dL — ABNORMAL HIGH (ref 70–99)
Potassium: 4.3 mmol/L (ref 3.5–5.1)
Sodium: 138 mmol/L (ref 135–145)
Total Bilirubin: 0.8 mg/dL (ref 0.3–1.2)
Total Protein: 6.6 g/dL (ref 6.5–8.1)

## 2018-09-19 LAB — CBC
HCT: 38.6 % — ABNORMAL LOW (ref 39.0–52.0)
Hemoglobin: 12.7 g/dL — ABNORMAL LOW (ref 13.0–17.0)
MCH: 30.6 pg (ref 26.0–34.0)
MCHC: 32.9 g/dL (ref 30.0–36.0)
MCV: 93 fL (ref 80.0–100.0)
Platelets: 382 10*3/uL (ref 150–400)
RBC: 4.15 MIL/uL — ABNORMAL LOW (ref 4.22–5.81)
RDW: 12 % (ref 11.5–15.5)
WBC: 11.8 10*3/uL — ABNORMAL HIGH (ref 4.0–10.5)
nRBC: 0 % (ref 0.0–0.2)

## 2018-09-19 LAB — HIV ANTIBODY (ROUTINE TESTING W REFLEX): HIV Screen 4th Generation wRfx: NONREACTIVE

## 2018-09-19 MED ORDER — LIP MEDEX EX OINT
TOPICAL_OINTMENT | CUTANEOUS | Status: AC
  Administered 2018-09-19: 08:00:00
  Filled 2018-09-19: qty 7

## 2018-09-19 MED ORDER — LIP MEDEX EX OINT: TOPICAL_OINTMENT | CUTANEOUS | Status: DC | PRN

## 2018-09-19 NOTE — Progress Notes (Signed)
Pt MEWS score is 2 this is an acute change, pt is febrile at 101.8. Medication administered, MD made aware, will continue to monitor.

## 2018-09-19 NOTE — Progress Notes (Signed)
[facility]/Trauma Progress Note  1 Day Post-Op   Assessment/Plan Tobacco abuse CAD - cardiac cath 2016 showed 40% stenosis of LAD.  Patient has not followed up with cardiology.  Discussed importance of follow-up this morning with patient  Acute ruptured walled off appendicitis with abscess and phlegmon - S/p laparoscopic appendectomy and drainage of abscess, drain, Dr. Hassell Done, 09/10 - Patient is passing flatus and tolerating a clear liquid diet.  He is at high risk for ileus and abscess formation.  FEN: FLD VTE: SCD's, lovenox ID: Flagyl and Cipro 9/10>> Foley: None Follow up: [facility]  DISPO: Ambulate, will advance diet slowly.  Continue antibiotics.    LOS: 1 day    Subjective: CC: Abdominal pain  Patient denies nausea, vomiting, fever or chills overnight.  He is urinating without issues.  He is passing flatus.  No BM yet.  He tolerated clear liquid diet this morning.  Discussed increased risk of ileus and abscess formation with patient this morning.  He wants to go home.  I also discussed the importance of cardiology follow-up.  He states he has not followed up since his heart cath in 2016.  He does have a PCP.  Objective: Vital signs in last 24 hours: Temp:  [97.9 F (36.6 C)-100.8 F (38.2 C)] 99.9 F (37.7 C) (09/11 0536) Pulse Rate:  [73-91] 81 (09/11 0536) Resp:  [13-21] 20 (09/11 0536) BP: (109-136)/(72-95) 115/79 (09/11 0536) SpO2:  [95 %-100 %] 99 % (09/11 0536) Last BM Date: 09/18/18  Intake/Output from previous day: 09/10 0701 - 09/11 0700 In: 3817.8 [P.O.:250; I.V.:2266.5; IV Piggyback:1301.3] Out: 1955 [Urine:1750; Drains:155; Blood:50] Intake/Output this shift: No intake/output data recorded.  PE: Gen:  Alert, NAD, pleasant, cooperative Card:  RRR, no M/G/R heard Pulm:  CTA, no W/R/R, effort normal Abd: Soft, ND, +BS, drain with moderate serosanguineous drainage, mild generalized TTP without guarding.  No peritonitis Skin: no rashes  noted, warm and dry   Anti-infectives: Anti-infectives (From admission, onward)   Start     Dose/Rate Route Frequency Ordered Stop   09/19/18 0200  ciprofloxacin (CIPRO) IVPB 400 mg     400 mg 200 mL/hr over 60 Minutes Intravenous Every 12 hours 09/18/18 1947     09/18/18 2359  metroNIDAZOLE (FLAGYL) IVPB 500 mg     500 mg 100 mL/hr over 60 Minutes Intravenous Every 8 hours 09/18/18 1947     09/18/18 1345  ciprofloxacin (CIPRO) IVPB 400 mg     400 mg 200 mL/hr over 60 Minutes Intravenous  Once 09/18/18 1332 09/18/18 1500   09/18/18 1345  metroNIDAZOLE (FLAGYL) IVPB 500 mg     500 mg 100 mL/hr over 60 Minutes Intravenous  Once 09/18/18 1332 09/18/18 1633      Lab Results:  Recent Labs    09/18/18 1113 09/19/18 0324  WBC 12.1* 11.8*  HGB 14.1 12.7*  HCT 42.9 38.6*  PLT 420* 382   BMET Recent Labs    09/18/18 1113 09/19/18 0324  NA 137 138  K 4.2 4.3  CL 102 102  CO2 25 28  GLUCOSE 98 116*  BUN 12 7  CREATININE 1.05 0.90  CALCIUM 8.6* 8.4*   PT/INR No results for input(s): LABPROT, INR in the last 72 hours. CMP     Component Value Date/Time   NA 138 09/19/2018 0324   K 4.3 09/19/2018 0324   CL 102 09/19/2018 0324   CO2 28 09/19/2018 0324   GLUCOSE 116 (H) 09/19/2018 0324   BUN 7 09/19/2018  0324   CREATININE 0.90 09/19/2018 0324   CALCIUM 8.4 (L) 09/19/2018 0324   PROT 6.6 09/19/2018 0324   ALBUMIN 3.0 (L) 09/19/2018 0324   AST 19 09/19/2018 0324   ALT 34 09/19/2018 0324   ALKPHOS 54 09/19/2018 0324   BILITOT 0.8 09/19/2018 0324   GFRNONAA >60 09/19/2018 0324   GFRAA >60 09/19/2018 0324   Lipase     Component Value Date/Time   LIPASE 25 09/18/2018 1113    Studies/Results: Ct Abdomen Pelvis W Contrast  Result Date: 09/18/2018 CLINICAL DATA:  Abdominal pain. EXAM: CT ABDOMEN AND PELVIS WITH CONTRAST TECHNIQUE: Multidetector CT imaging of the abdomen and pelvis was performed using the standard protocol following bolus administration of intravenous  contrast. CONTRAST:  100mL OMNIPAQUE IOHEXOL 300 MG/ML  SOLN COMPARISON:  July 16, 2017 FINDINGS: Lower chest: No acute abnormality. Hepatobiliary: No focal liver abnormality is seen. No gallstones, gallbladder wall thickening, or biliary dilatation. Pancreas: Unremarkable. No pancreatic ductal dilatation or surrounding inflammatory changes. Spleen: Normal in size without focal abnormality. Adrenals/Urinary Tract: Adrenal glands are unremarkable. Kidneys are normal, without renal calculi, focal lesion, or hydronephrosis. Bladder is unremarkable. Stomach/Bowel: Normal appearance of the stomach and proximal small bowel. The appendix is thickened to 1.3 cm with indistinct borders. Marked periappendiceal fat stranding. Small amount of fluid is seen dependently at the base of the appendix. Fluid is seen tracking into the right pelvis. Short segment of the terminal ileum demonstrates inflammatory changes, likely reactive. Numerous likely reactive rounded mesenteric lymph nodes in the right lower quadrant of the abdomen. Scattered right colonic diverticulosis. Vascular/Lymphatic: Mild calcific atherosclerotic disease of the abdominal aorta. Reproductive: Prostate is unremarkable. Other: No abdominal wall hernia or abnormality. No abdominopelvic ascites. Musculoskeletal: No acute or significant osseous findings. IMPRESSION: 1. Acute appendicitis with marked periappendiceal fat stranding and small amount of fluid tracking into the right pelvis. Micro rupture cannot be excluded with this appearance. No organized abscess seen. 2. Numerous likely reactive rounded mesenteric lymph nodes in the right lower quadrant of the abdomen. 3. Right colonic diverticulosis without evidence of diverticulitis. 4. 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 Electronically Signed   By: Ted Mcalpineobrinka  Dimitrova M.D.   On: 09/18/2018 13:01      Jerre SimonJessica L Lundon Verdejo , [facility] Hospital[facility] 09/19/2018, 10:14 AM  Pager:  [phone] Mon-Wed, Friday 7:00am-4:30pm Thurs 7am-11:30am  Consults: [phone]

## 2018-09-20 LAB — BASIC METABOLIC PANEL
Anion gap: 8 (ref 5–15)
BUN: 6 mg/dL (ref 6–20)
CO2: 28 mmol/L (ref 22–32)
Calcium: 8.6 mg/dL — ABNORMAL LOW (ref 8.9–10.3)
Chloride: 102 mmol/L (ref 98–111)
Creatinine, Ser: 0.99 mg/dL (ref 0.61–1.24)
GFR calc Af Amer: >60 mL/min (ref >60)
GFR calc non Af Amer: >60 mL/min (ref >60)
Glucose, Bld: 108 mg/dL — ABNORMAL HIGH (ref 70–99)
Potassium: 3.8 mmol/L (ref 3.5–5.1)
Sodium: 138 mmol/L (ref 135–145)

## 2018-09-20 LAB — CBC
HCT: 39.4 % (ref 39.0–52.0)
Hemoglobin: 12.9 g/dL — ABNORMAL LOW (ref 13.0–17.0)
MCH: 30.2 pg (ref 26.0–34.0)
MCHC: 32.7 g/dL (ref 30.0–36.0)
MCV: 92.3 fL (ref 80.0–100.0)
Platelets: 412 10*3/uL — ABNORMAL HIGH (ref 150–400)
RBC: 4.27 MIL/uL (ref 4.22–5.81)
RDW: 12.1 % (ref 11.5–15.5)
WBC: 11 10*3/uL — ABNORMAL HIGH (ref 4.0–10.5)
nRBC: 0 % (ref 0.0–0.2)

## 2018-09-20 MED ORDER — METOCLOPRAMIDE HCL 5 MG/ML IJ SOLN
5.0000 mg | Freq: Three times a day (TID) | INTRAMUSCULAR | Status: DC | PRN
  Administered 2018-09-20: 5 mg via INTRAVENOUS
  Filled 2018-09-20: qty 2

## 2018-09-20 MED ORDER — CALCIUM CARBONATE ANTACID 500 MG PO CHEW
1.0000 | CHEWABLE_TABLET | Freq: Two times a day (BID) | ORAL | Status: DC | PRN
  Administered 2018-09-20: 200 mg via ORAL
  Filled 2018-09-20: qty 1

## 2018-09-20 MED ORDER — SIMETHICONE 80 MG PO CHEW
80.0000 mg | CHEWABLE_TABLET | Freq: Four times a day (QID) | ORAL | Status: DC | PRN
  Administered 2018-09-20 – 2018-09-21 (×3): 80 mg via ORAL
  Filled 2018-09-20 (×3): qty 1

## 2018-09-20 NOTE — Progress Notes (Signed)
[facility] Progress Note  2 Days Post-Op   Assessment/Plan Tobacco abuse CAD - cardiac cath 2016 showed 40% stenosis of LAD.  Patient has not followed up with cardiology.    Acute ruptured walled off appendicitis with abscess and phlegmon - S/p laparoscopic appendectomy and drainage of abscess, drain, Dr. Daphine DeutscherMartin, 09/10 - Patient is passing flatus and tolerating a liquid diet.  He is at high risk for ileus and abscess formation.  FEN: FLD VTE: SCD's, lovenox ID: Flagyl and Cipro 9/10>> Foley: None Follow up: [facility]  DISPO: Ambulate, will advance diet slowly.  Continue antibiotics.    LOS: 2 days    Subjective: CC: Abdominal pain  Patient denies nausea, vomiting, fever or chills overnight.  He is urinating without issues.  He is passing flatus.  No BM yet.  He tolerated full liquid diet this morning but he complains of a lot of belching.  Discussed increased risk of ileus and abscess formation with patient this morning.  He wants to go home.   Objective: Vital signs in last 24 hours: Temp:  [98.3 F (36.8 C)-101.8 F (38.8 C)] 99.5 F (37.5 C) (09/12 0644) Pulse Rate:  [74-99] 81 (09/12 0644) Resp:  [16-18] 18 (09/12 0644) BP: (113-140)/(72-85) 113/85 (09/12 0644) SpO2:  [91 %-99 %] 94 % (09/12 0644) Last BM Date: 09/18/18  Intake/Output from previous day: 09/11 0701 - 09/12 0700 In: 2734.1 [P.O.:930; I.V.:1104; IV Piggyback:700.1] Out: 1006 [Urine:926; Drains:80] Intake/Output this shift: No intake/output data recorded.  PE: Gen:  Alert, NAD, pleasant, cooperative Abd: Soft, ND,  drain with moderate serosanguineous drainage, mild generalized TTP without guarding.  No peritonitis Skin: no rashes noted, warm and dry   Anti-infectives: Anti-infectives (From admission, onward)   Start     Dose/Rate Route Frequency Ordered Stop   09/19/18 0200  ciprofloxacin (CIPRO) IVPB 400 mg     400 mg 200 mL/hr over 60 Minutes Intravenous Every 12 hours  09/18/18 1947     09/18/18 2359  metroNIDAZOLE (FLAGYL) IVPB 500 mg     500 mg 100 mL/hr over 60 Minutes Intravenous Every 8 hours 09/18/18 1947     09/18/18 1345  ciprofloxacin (CIPRO) IVPB 400 mg     400 mg 200 mL/hr over 60 Minutes Intravenous  Once 09/18/18 1332 09/18/18 1500   09/18/18 1345  metroNIDAZOLE (FLAGYL) IVPB 500 mg     500 mg 100 mL/hr over 60 Minutes Intravenous  Once 09/18/18 1332 09/18/18 1633      Lab Results:  Recent Labs    09/19/18 0324 09/20/18 0318  WBC 11.8* 11.0*  HGB 12.7* 12.9*  HCT 38.6* 39.4  PLT 382 412*   BMET Recent Labs    09/19/18 0324 09/20/18 0318  NA 138 138  K 4.3 3.8  CL 102 102  CO2 28 28  GLUCOSE 116* 108*  BUN 7 6  CREATININE 0.90 0.99  CALCIUM 8.4* 8.6*   PT/INR No results for input(s): LABPROT, INR in the last 72 hours. CMP     Component Value Date/Time   NA 138 09/20/2018 0318   K 3.8 09/20/2018 0318   CL 102 09/20/2018 0318   CO2 28 09/20/2018 0318   GLUCOSE 108 (H) 09/20/2018 0318   BUN 6 09/20/2018 0318   CREATININE 0.99 09/20/2018 0318   CALCIUM 8.6 (L) 09/20/2018 0318   PROT 6.6 09/19/2018 0324   ALBUMIN 3.0 (L) 09/19/2018 0324   AST 19 09/19/2018 0324   ALT 34 09/19/2018 0324   ALKPHOS  54 09/19/2018 0324   BILITOT 0.8 09/19/2018 0324   GFRNONAA >60 09/20/2018 0318   GFRAA >60 09/20/2018 0318   Lipase     Component Value Date/Time   LIPASE 25 09/18/2018 1113    Studies/Results: Ct Abdomen Pelvis W Contrast  Result Date: 09/18/2018 CLINICAL DATA:  Abdominal pain. EXAM: CT ABDOMEN AND PELVIS WITH CONTRAST TECHNIQUE: Multidetector CT imaging of the abdomen and pelvis was performed using the standard protocol following bolus administration of intravenous contrast. CONTRAST:  137mL OMNIPAQUE IOHEXOL 300 MG/ML  SOLN COMPARISON:  July 16, 2017 FINDINGS: Lower chest: No acute abnormality. Hepatobiliary: No focal liver abnormality is seen. No gallstones, gallbladder wall thickening, or biliary dilatation.  Pancreas: Unremarkable. No pancreatic ductal dilatation or surrounding inflammatory changes. Spleen: Normal in size without focal abnormality. Adrenals/Urinary Tract: Adrenal glands are unremarkable. Kidneys are normal, without renal calculi, focal lesion, or hydronephrosis. Bladder is unremarkable. Stomach/Bowel: Normal appearance of the stomach and proximal small bowel. The appendix is thickened to 1.3 cm with indistinct borders. Marked periappendiceal fat stranding. Small amount of fluid is seen dependently at the base of the appendix. Fluid is seen tracking into the right pelvis. Short segment of the terminal ileum demonstrates inflammatory changes, likely reactive. Numerous likely reactive rounded mesenteric lymph nodes in the right lower quadrant of the abdomen. Scattered right colonic diverticulosis. Vascular/Lymphatic: Mild calcific atherosclerotic disease of the abdominal aorta. Reproductive: Prostate is unremarkable. Other: No abdominal wall hernia or abnormality. No abdominopelvic ascites. Musculoskeletal: No acute or significant osseous findings. IMPRESSION: 1. Acute appendicitis with marked periappendiceal fat stranding and small amount of fluid tracking into the right pelvis. Micro rupture cannot be excluded with this appearance. No organized abscess seen. 2. Numerous likely reactive rounded mesenteric lymph nodes in the right lower quadrant of the abdomen. 3. Right colonic diverticulosis without evidence of diverticulitis. 4. 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 Electronically Signed   By: Fidela Salisbury M.D.   On: 79/15/0569 79:48      Rosario Adie, MD  [facility]

## 2018-09-20 NOTE — Plan of Care (Signed)
Patient standing in room this morning; complains of gas/reflux issues causing nausea and emesis. Complains of moderate pain as well. Pain meds given along with medications for nausea and gas. Will continue to monitor.

## 2018-09-20 NOTE — Progress Notes (Signed)
Patient complaining of increased gas and reflux leading to emesis; states he is not really nauseous but cannot seem to expel gas and get rid of the reflux feeling. Has received Tums, Mylicon, and Zofran previously this morning for this w/ no relief. Physician on call paged; new order received for 5mg  Reglan IV every 8 hours as needed. Will continue to monitor.

## 2018-09-21 MED ORDER — METRONIDAZOLE 500 MG PO TABS
500.0000 mg | ORAL_TABLET | Freq: Four times a day (QID) | ORAL | Status: DC
  Administered 2018-09-21 – 2018-09-22 (×5): 500 mg via ORAL
  Filled 2018-09-21 (×5): qty 1

## 2018-09-21 MED ORDER — CIPROFLOXACIN HCL 500 MG PO TABS
500.0000 mg | ORAL_TABLET | Freq: Two times a day (BID) | ORAL | Status: DC
  Administered 2018-09-21 – 2018-09-22 (×3): 500 mg via ORAL
  Filled 2018-09-21 (×3): qty 1

## 2018-09-21 NOTE — Progress Notes (Signed)
[facility]/Tra[facility] Progress Note  3 Days Post-Op   Assessment/Plan Tobacco abuse CAD - cardiac cath 2016 showed 40% stenosis of LAD.  Patient has not followed up with cardiology.    Acute ruptured walled off appendicitis with abscess and phlegmon - S/p laparoscopic appendectomy and drainage of abscess, drain, Dr. Hassell Done, 09/10 - Patient is passing flatus and tolerating a liquid diet.  He is having bowel function FEN: FLD VTE: SCD's, lovenox ID: Flagyl and Cipro 9/10>> switched to PO 9/13 Foley: None Follow up: [facility]  DISPO: Ambulate, will advance diet as tolerated now that he is having bowel function.  Continue antibiotics.    LOS: 3 days    Subjective: CC: Abdominal pain  Patient denies nausea, vomiting, fever or chills overnight.  He is urinating without issues.  He is passing flatus and has had a BM.  He tolerated full liquid diet.  He would like to continue this for now  Objective: Vital signs in last 24 hours: Temp:  [98.5 F (36.9 C)-99.5 F (37.5 C)] 98.6 F (37 C) (09/13 0619) Pulse Rate:  [84-102] 89 (09/13 0619) Resp:  [16-19] 19 (09/13 0619) BP: (116-140)/(76-88) 134/88 (09/13 0619) SpO2:  [91 %-97 %] 96 % (09/13 0619) Last BM Date: 09/18/18  Intake/Output from previous day: 09/12 0701 - 09/13 0700 In: 2483.8 [P.O.:840; I.V.:953; IV Piggyback:690.9] Out: 50 [Drains:50] Intake/Output this shift: No intake/output data recorded.  PE: Gen:  Alert, NAD, pleasant, cooperative Abd: Soft, ND,  drain with moderate serosanguineous drainage, mild generalized TTP without guarding.  No peritonitis Skin: no rashes noted, warm and dry   Anti-infectives: Anti-infectives (From admission, onward)   Start     Dose/Rate Route Frequency Ordered Stop   09/21/18 0845  ciprofloxacin (CIPRO) tablet 500 mg     500 mg Oral 2 times daily 09/21/18 0835     09/21/18 0845  metroNIDAZOLE (FLAGYL) tablet 500 mg     500 mg Oral Every 6 hours 09/21/18 0835     09/19/18  0200  ciprofloxacin (CIPRO) IVPB 400 mg  Status:  Discontinued     400 mg 200 mL/hr over 60 Minutes Intravenous Every 12 hours 09/18/18 1947 09/21/18 0835   09/18/18 2359  metroNIDAZOLE (FLAGYL) IVPB 500 mg  Status:  Discontinued     500 mg 100 mL/hr over 60 Minutes Intravenous Every 8 hours 09/18/18 1947 09/21/18 0835   09/18/18 1345  ciprofloxacin (CIPRO) IVPB 400 mg     400 mg 200 mL/hr over 60 Minutes Intravenous  Once 09/18/18 1332 09/18/18 1500   09/18/18 1345  metroNIDAZOLE (FLAGYL) IVPB 500 mg     500 mg 100 mL/hr over 60 Minutes Intravenous  Once 09/18/18 1332 09/18/18 1633      Lab Results:  Recent Labs    09/19/18 0324 09/20/18 0318  WBC 11.8* 11.0*  HGB 12.7* 12.9*  HCT 38.6* 39.4  PLT 382 412*   BMET Recent Labs    09/19/18 0324 09/20/18 0318  NA 138 138  K 4.3 3.8  CL 102 102  CO2 28 28  GLUCOSE 116* 108*  BUN 7 6  CREATININE 0.90 0.99  CALCIUM 8.4* 8.6*   PT/INR No results for input(s): LABPROT, INR in the last 72 hours. CMP     Component Value Date/Time   NA 138 09/20/2018 0318   K 3.8 09/20/2018 0318   CL 102 09/20/2018 0318   CO2 28 09/20/2018 0318   GLUCOSE 108 (H) 09/20/2018 0318   BUN 6 09/20/2018 0318  CREATININE 0.99 09/20/2018 0318   CALCIUM 8.6 (L) 09/20/2018 0318   PROT 6.6 09/19/2018 0324   ALBUMIN 3.0 (L) 09/19/2018 0324   AST 19 09/19/2018 0324   ALT 34 09/19/2018 0324   ALKPHOS 54 09/19/2018 0324   BILITOT 0.8 09/19/2018 0324   GFRNONAA >60 09/20/2018 0318   GFRAA >60 09/20/2018 0318   Lipase     Component Value Date/Time   LIPASE 25 09/18/2018 1113    Studies/Results: No results found.    Vanita PandaAlicia C Shemeika Starzyk, MD  [facility] and [facility]

## 2018-09-22 LAB — CBC
HCT: 39.3 % (ref 39.0–52.0)
Hemoglobin: 12.9 g/dL — ABNORMAL LOW (ref 13.0–17.0)
MCH: 29.9 pg (ref 26.0–34.0)
MCHC: 32.8 g/dL (ref 30.0–36.0)
MCV: 91.2 fL (ref 80.0–100.0)
Platelets: 470 10*3/uL — ABNORMAL HIGH (ref 150–400)
RBC: 4.31 MIL/uL (ref 4.22–5.81)
RDW: 11.9 % (ref 11.5–15.5)
WBC: 6.8 10*3/uL (ref 4.0–10.5)
nRBC: 0 % (ref 0.0–0.2)

## 2018-09-22 MED ORDER — METRONIDAZOLE 500 MG PO TABS: 500.0000 mg | ORAL_TABLET | Freq: Four times a day (QID) | ORAL | 0 refills | Status: AC

## 2018-09-22 MED ORDER — POLYETHYLENE GLYCOL 3350 17 G PO PACK: 17.0000 g | PACK | Freq: Every day | ORAL | 0 refills | Status: DC | PRN

## 2018-09-22 MED ORDER — DOCUSATE SODIUM 100 MG PO CAPS: 100.0000 mg | ORAL_CAPSULE | Freq: Two times a day (BID) | ORAL | 0 refills | Status: DC

## 2018-09-22 MED ORDER — HYDROCODONE-ACETAMINOPHEN 5-325 MG PO TABS: 1.0000 | ORAL_TABLET | Freq: Four times a day (QID) | ORAL | 0 refills | Status: DC | PRN

## 2018-09-22 MED ORDER — CIPROFLOXACIN HCL 500 MG PO TABS: 500.0000 mg | ORAL_TABLET | Freq: Two times a day (BID) | ORAL | 0 refills | Status: AC

## 2018-09-22 MED ORDER — PANTOPRAZOLE SODIUM 40 MG PO TBEC: 40.0000 mg | DELAYED_RELEASE_TABLET | Freq: Every day | ORAL | Status: DC

## 2018-09-22 NOTE — Discharge Summary (Signed)
[facility]/Trauma Discharge Summary   Patient ID: Kyle Silva MRN: [id number] DOB/AGE: 06-17-1980 38 y.o.  Admit date: 09/18/2018 Discharge date: 09/22/2018  Admitting Diagnosis: Acute appendicitis  Discharge Diagnosis Patient Active Problem List   Diagnosis Date Noted  . Acute appendicitis 09/18/2018  . Ruptured appendicitis 09/18/2018  . Acute diverticulitis 08/30/2015  . Current smoker 07/13/2014  . Family history of premature CAD 07/13/2014  . Obese 07/13/2014  . Unstable angina ([place]) 07/13/2014  . Chest pain     Consultants None  Imaging: No results found.  Procedures Dr. Hassell Done (09/18/2018) - Laparoscopic appendectomy and drainage of abscess, placement of JP drain  HPI: Kyle Silva is a 38yo male with prior history of diverticulitis, who presented to [facility] earlier today complaining of 3-4 days of abdominal pain. States that the pain is periumbilical and in his lower abdomen. Worse with palpation and PO intake. TMAX 100.2. Denies, chills, nausea, vomiting, dysuria, diarrhea, constipation. Last BM was this morning. He tried taking oxycodone yesterday but the pain relief did not last. ED workup included CT scan which shows acute appendicitis with marked periappendiceal fat stranding and small amount of fluid tracking into the right pelvis, micro rupture cannot be excluded, no organized abscess seen; numerous likely reactive rounded mesenteric lymph nodes in the right lower quadrant of the abdomen; right colonic diverticulosis without evidence of diverticulitis. Lab work unremarkable except WBC 12.1. General surgery asked to see.  Abdominal surgical history: none Anticoagulants: none Smokes 1.5 PPD Employment: Programmer, systems at [employer]   Hospital Course:  Workup showed acute appendicitis. Patient was admitted and underwent procedure listed above.  Postoperative diagnosis was acute ruptured walled off appendicitis with abscess and  phlegmon.  Tolerated procedure well and was transferred to the floor.  Diet was advanced as tolerated.  Per review of EMR, patient was noted to have had a cardiac cath in 2016 which showed 40% stenosis of LAD.  Patient stated he never followed up.  I contacted cardiology who scheduled patient a follow-up appointment as noted below.  Patient did not complain of chest pain during his admission.  On POD#4, the patient was voiding well, tolerating diet, ambulating well, pain well controlled, vital signs stable, incisions c/d/i and felt stable for discharge home.  Patient was discharged with his JP drain 2/2 high output.  Patient will follow up as outlined below and knows to call with questions or concerns.     Pathology was still pending at time of discharge.  Patient was discharged in good condition.  The [place] Substance controlled database was reviewed prior to prescribing narcotic pain medication to this patient.  Physical Exam: Gen:  Alert, NAD, pleasant, cooperative Card:  RRR, no M/G/R heard Pulm:  CTA, no W/R/R, rate and effort normal Abd: Soft, ND, +BS, drain with moderate serosanguineous drainage, very mild generalized TTP without guarding.  Incisions are well-appearing. No peritonitis Skin: no rashes noted, warm and dry  Allergies as of 09/22/2018      Reactions   Penicillins Anaphylaxis   Has patient had a PCN reaction causing immediate rash, facial/tongue/throat swelling, SOB or lightheadedness with hypotension:Yes Has patient had a PCN reaction causing severe rash involving mucus membranes or skin necrosis:unsure Has patient had a PCN reaction that required hospitalization:Yes Has patient had a PCN reaction occurring within the last 10 years:Yes If all of the above answers are "NO", then may proceed with Cephalosporin use.       Medication List  STOP taking these medications   diazepam 5 MG tablet Commonly known as: VALIUM   doxycycline 100 MG capsule Commonly  known as: VIBRAMYCIN   naproxen 500 MG tablet Commonly known as: Naprosyn   ondansetron 4 MG disintegrating tablet Commonly known as: Zofran ODT   ondansetron 4 MG tablet Commonly known as: ZOFRAN   oxyCODONE-acetaminophen 5-325 MG tablet Commonly known as: PERCOCET/ROXICET   pantoprazole 40 MG tablet Commonly known as: Protonix   promethazine 25 MG tablet Commonly known as: PHENERGAN   traMADol 50 MG tablet Commonly known as: Ultram     TAKE these medications   ciprofloxacin 500 MG tablet Commonly known as: CIPRO Take 1 tablet (500 mg total) by mouth 2 (two) times daily for 7 days. What changed:   when to take this  additional instructions   docusate sodium 100 MG capsule Commonly known as: COLACE Take 1 capsule (100 mg total) by mouth 2 (two) times daily.   HYDROcodone-acetaminophen 5-325 MG tablet Commonly known as: NORCO/VICODIN Take 1 tablet by mouth every 6 (six) hours as needed for moderate pain.   metroNIDAZOLE 500 MG tablet Commonly known as: FLAGYL Take 1 tablet (500 mg total) by mouth 4 (four) times daily for 7 days.   polyethylene glycol 17 g packet Commonly known as: MIRALAX / GLYCOLAX Take 17 g by mouth daily as needed for mild constipation. What changed:   when to take this  reasons to take this        Follow-up Information    [facility] Go on 09/26/2018.   Specialty: General Surgery Why: at 10:00am to have your drain evaluated. Please arrive 30 minutes prior to complete paperwork.  Please bring photo ID and insurance card. Contact information: [address] [place] [id number] [phone]       Hedda SladeGosai, Puja, PA-C. Go on 10/13/2018.   Specialty: General Surgery Why: at 1:30pm for your postop follow-up appointment.  Please arrive 15 minutes early to complete paperwork and bring your photo ID and insurance card. Contact information: [address] [place]  [id number] [phone]        Berton BonHammond, Janine, NP Follow up on 10/02/2018.   Specialty: Cardiology Why: Your follow-up appointment will be on 10/02/2018 at 9 AM.  Please arrive 15 minutes prior to appointment time. Contact information: [address] [place] [id number] [phone]           Signed: Joyce CopaJessica L [facility] 09/22/2018, 11:20 AM Pager: [phone] Consults: [phone] Mon-Fri 7:00 am-4:30 pm Sat-Sun 7:00 am-11:30 am

## 2018-09-22 NOTE — Progress Notes (Signed)
Pt alert, oriented, tolerating diet but not eating much per pt.  Instructed how to empty and charge his drain, instructed about changing dressing.  D/C instructions given, all questions answered. Pt d/cd home.

## 2018-09-22 NOTE — Plan of Care (Signed)
All goals met for d/c.

## 2018-09-22 NOTE — Discharge Instructions (Signed)
LAPAROSCOPIC SURGERY: POST OP INSTRUCTIONS  1. DIET: Follow a light bland diet the first 24 hours after arrival home, such as soup, liquids, crackers, etc. Be sure to include lots of fluids daily. Avoid fast food or heavy meals as your are more likely to get nauseated. Eat a low fat the next few days after surgery.  2. Take your usually prescribed home medications unless otherwise directed. 3. PAIN CONTROL:  1. Pain is best controlled by a usual combination of three different methods TOGETHER:  1. Ice/Heat 2. Over the counter pain medication 3. Prescription pain medication 2. Most patients will experience some swelling and bruising around the incisions. Ice packs or heating pads (30-60 minutes up to 6 times a day) will help. Use ice for the first few days to help decrease swelling and bruising, then switch to heat to help relax tight/sore spots and speed recovery. Some people prefer to use ice alone, heat alone, alternating between ice & heat. Experiment to what works for you. Swelling and bruising can take several weeks to resolve.  3. It is helpful to take an over-the-counter pain medication regularly for the first few weeks. Choose one of the following that works best for you:  1. Naproxen (Aleve, etc) Two 220mg  tabs twice a day 2. Ibuprofen (Advil, etc) Three 200mg  tabs four times a day (every meal & bedtime) 3. Acetaminophen (Tylenol, etc) 500-650mg  four times a day (every meal & bedtime) 4. A prescription for pain medication (such as oxycodone, hydrocodone, etc) should be given to you upon discharge. Take your pain medication as prescribed.  1. If you are having problems/concerns with the prescription medicine (does not control pain, nausea, vomiting, rash, itching, etc), please call us [phone] to see if we need to switch you to a different pain medicine that will work better for you and/or control your side effect better. 2. If you need a refill on your pain medication, please  contact your pharmacy. They will contact our office to request authorization. Prescriptions will not be filled after 5 pm or on week-ends. 4. Avoid getting constipated. Between the surgery and the pain medications, it is common to experience some constipation. Increasing fluid intake and taking a fiber supplement (such as Metamucil, Citrucel, FiberCon, MiraLax, etc) 1-2 times a day regularly will usually help prevent this problem from occurring. A mild laxative (prune juice, Milk of Magnesia, MiraLax, etc) should be taken according to package directions if there are no bowel movements after 48 hours.  5. Watch out for diarrhea. If you have many loose bowel movements, simplify your diet to bland foods & liquids for a few days. Stop any stool softeners and decrease your fiber supplement. Switching to mild anti-diarrheal medications (Kayopectate, Pepto Bismol) can help. If this worsens or does not improve, please call us. 6. Wash / shower every day. You may shower over the dressings as they are waterproof. Continue to shower over incision(s) after the dressing is off. 7. Remove your waterproof bandages 5 days after surgery. You may leave the incision open to air. You may replace a dressing/Band-Aid to cover the incision for comfort if you wish.  8. ACTIVITIES as tolerated:  1. You may resume regular (light) daily activities beginning the next day--such as daily self-care, walking, climbing stairs--gradually increasing activities as tolerated. If you can walk 30 minutes without difficulty, it is safe to try more intense activity such as jogging, treadmill, bicycling, low-impact aerobics, swimming, etc. 2. Save the most intensive and strenuous activity  for last such as sit-ups, heavy lifting, contact sports, etc Refrain from any heavy lifting or straining until you are off narcotics for pain control.  3. DO NOT PUSH THROUGH PAIN. Let pain be your guide: If it hurts to do something, don't do it. Pain is your body  warning you to avoid that activity for another week until the pain goes down. 4. You may drive when you are no longer taking prescription pain medication, you can comfortably wear a seatbelt, and you can safely maneuver your car and apply brakes. 5. You may have sexual intercourse when it is comfortable.  9. FOLLOW UP in our office  1. Please call [facility] at [phone] to set up an appointment to see your surgeon in the office for a follow-up appointment approximately 2-3 weeks after your surgery. 2. Make sure that you call for this appointment the day you arrive home to insure a convenient appointment time.      10. IF YOU HAVE DISABILITY OR FAMILY LEAVE FORMS, BRING THEM TO THE               OFFICE FOR PROCESSING.   WHEN TO CALL us [phone]:  1. Poor pain control 2. Reactions / problems with new medications (rash/itching, nausea, etc)  3. Fever over 101.5 F (38.5 C) 4. Inability to urinate 5. Nausea and/or vomiting 6. Worsening swelling or bruising 7. Continued bleeding from incision. 8. Increased pain, redness, or drainage from the incision  The clinic staff is available to answer your questions during regular business hours (8:30am-5pm). Please dont hesitate to call and ask to speak to one of our nurses for clinical concerns.  If you have a medical emergency, go to the nearest emergency room or call 911.  A surgeon from [facility] is always on call at the [facility], [place], [place], [place], [place] [address] ?  MAIN: [phone] ? TOLL FREE: [phone] ?  FAX [phone]  [email or web address]     YOU NEED TO FOLLOW UP WITH DR. Acie Fredrickson WITH [facility]. CALL THEIR OFFICE TO SET UP AN APPOINTMENT.     Surgical [facility] Care Surgical drains are used to remove extra fluid that normally builds up in a surgical wound after surgery. A surgical drain helps to heal a surgical wound. Different  kinds of surgical drains include:  Active drains. These drains use suction to pull drainage away from the surgical wound. Drainage flows through a tube to a container outside of the body. With these drains, you need to keep the bulb or the drainage container flat (compressed) at all times, except while you empty it. Flattening the bulb or container creates suction.  Passive drains. These drains allow fluid to drain naturally, by gravity. Drainage flows through a tube to a bandage (dressing) or a container outside of the body. Passive drains do not need to be emptied. A drain is placed during surgery. Right after surgery, drainage is usually bright red and a little thicker than water. The drainage may gradually turn yellow or pink and become thinner. It is likely that your health care provider will remove the drain when the drainage stops or when the amount decreases to 1-2 Tbsp (15-30 mL) during a 24-hour period. Supplies needed:  Tape.  Germ-free cleaning solution (sterile saline).  Cotton swabs.  Split gauze drain sponge: 4 x 4 inches (10 x 10 cm).  Gauze square: 4 x 4 inches (  10 x 10 cm). How to care for your surgical drain Care for your drain as told by your health care provider. This is important to help prevent infection. If your drain is placed at your back, or any other hard-to-reach area, ask another person to assist you in performing the following tasks: General care  Keep the skin around the drain dry and covered with a dressing at all times.  Check your drain area every day for signs of infection. Check for: ? Redness, swelling, or pain. ? Pus or a bad smell. ? Cloudy drainage. ? Tenderness or pressure at the drain exit site. Changing the dressing Follow instructions from your health care provider about how to change your dressing. Change your dressing at least once a day. Change it more often if needed to keep the dressing dry. Make sure you: 1. Gather your  supplies. 2. Wash your hands with soap and water before you change your dressing. If soap and water are not available, use hand sanitizer. 3. Remove the old dressing. Avoid using scissors to do that. 4. Wash your hands with soap and water again after removing the old dressing. 5. Use sterile saline to clean your skin around the drain. You may need to use a cotton swab to clean the skin. 6. Place the tube through the slit in a drain sponge. Place the drain sponge so that it covers your wound. 7. Place the gauze square or another drain sponge on top of the drain sponge that is on the wound. Make sure the tube is between those layers. 8. Tape the dressing to your skin. 9. Tape the drainage tube to your skin 1-2 inches (2.5-5 cm) below the place where the tube enters your body. Taping keeps the tube from pulling on any stitches (sutures) that you have. 10. Wash your hands with soap and water. 11. Write down the color of your drainage and how often you change your dressing. How to empty your active drain  1. Make sure that you have a measuring cup that you can empty your drainage into. 2. Wash your hands with soap and water. If soap and water are not available, use hand sanitizer. 3. Loosen any pins or clips that hold the tube in place. 4. If your health care provider tells you to strip the tube to prevent clots and tube blockages: ? Hold the tube at the skin with one hand. Use your other hand to pinch the tubing with your thumb and first finger. ? Gently move your fingers down the tube while squeezing very lightly. This clears any drainage, clots, or tissue from the tube. ? You may need to do this several times each day to keep the tube clear. Do not pull on the tube. 5. Open the bulb cap or the drain plug. Do not touch the inside of the cap or the bottom of the plug. 6. Turn the device upside down and gently squeeze. 7. Empty all of the drainage into the measuring cup. 8. Compress the bulb or the  container and replace the cap or the plug. To compress the bulb or the container, squeeze it firmly in the middle while you close the cap or plug the container. 9. Write down the amount of drainage that you have in each 24-hour period. If you have less than 2 Tbsp (30 mL) of drainage during 24 hours, contact your health care provider. 10. Flush the drainage down the toilet. 11. Wash your hands with soap and water.  Contact a health care provider if:  You have redness, swelling, or pain around your drain area.  You have pus or a bad smell coming from your drain area.  You have a fever or chills.  The skin around your drain is warm to the touch.  The amount of drainage that you have is increasing instead of decreasing.  You have drainage that is cloudy.  There is a sudden stop or a sudden decrease in the amount of drainage that you have.  Your drain tube falls out.  Your active drain does not stay compressed after you empty it. Summary  Surgical drains are used to remove extra fluid that normally builds up in a surgical wound after surgery.  Different kinds of surgical drains include active drains and passive drains. Active drains use suction to pull drainage away from the surgical wound, and passive drains allow fluid to drain naturally.  It is important to care for your drain to prevent infection. If your drain is placed at your back, or any other hard-to-reach area, ask another person to assist you.  Contact your health care provider if you have redness, swelling, or pain around your drain area. This information is not intended to replace advice given to you by your health care provider. Make sure you discuss any questions you have with your health care provider. Document Released: 12/23/1999 Document Revised: 01/29/2018 Document Reviewed: 01/29/2018 Elsevier Patient Education  2020 [employer]

## 2018-10-02 ENCOUNTER — Encounter: Payer: Self-pay | Admitting: Cardiology

## 2018-10-02 ENCOUNTER — Ambulatory Visit: Payer: 59 | Admitting: Cardiology

## 2018-10-02 ENCOUNTER — Other Ambulatory Visit: Payer: Self-pay

## 2018-10-02 VITALS — BP 122/80 | HR 86 | Ht 70.0 in | Wt 259.0 lb

## 2018-10-02 DIAGNOSIS — I2510 Atherosclerotic heart disease of native coronary artery without angina pectoris: Secondary | ICD-10-CM | POA: Diagnosis not present

## 2018-10-02 DIAGNOSIS — F17200 Nicotine dependence, unspecified, uncomplicated: Secondary | ICD-10-CM | POA: Diagnosis not present

## 2018-10-02 DIAGNOSIS — E6609 Other obesity due to excess calories: Secondary | ICD-10-CM

## 2018-10-02 DIAGNOSIS — Z6837 Body mass index (BMI) 37.0-37.9, adult: Secondary | ICD-10-CM

## 2018-10-02 DIAGNOSIS — E785 Hyperlipidemia, unspecified: Secondary | ICD-10-CM | POA: Diagnosis not present

## 2018-10-02 LAB — LIPID PANEL
Chol/HDL Ratio: 4.9 ratio (ref 0.0–5.0)
Cholesterol, Total: 165 mg/dL (ref 100–199)
HDL: 34 mg/dL — ABNORMAL LOW (ref >39)
LDL Chol Calc (NIH): 81 mg/dL (ref 0–99)
Triglycerides: 308 mg/dL — ABNORMAL HIGH (ref 0–149)
VLDL Cholesterol Cal: 50 mg/dL — ABNORMAL HIGH (ref 5–40)

## 2018-10-02 LAB — HEMOGLOBIN A1C
Est. average glucose Bld gHb Est-mCnc: 126 mg/dL
Hgb A1c MFr Bld: 6 % — ABNORMAL HIGH (ref 4.8–5.6)

## 2018-10-02 MED ORDER — ATORVASTATIN CALCIUM 40 MG PO TABS: 40.0000 mg | ORAL_TABLET | Freq: Every day | ORAL | 3 refills | Status: DC

## 2018-10-02 MED ORDER — PANTOPRAZOLE SODIUM 40 MG PO TBEC: 40.0000 mg | DELAYED_RELEASE_TABLET | Freq: Every day | ORAL | 3 refills | Status: DC

## 2018-10-02 NOTE — Patient Instructions (Addendum)
Medication Instructions:  START: Atorvastatin 40 mg taking 1 tablet once a day   START: Protonix 40 mg taking 1 tablet once a day  START: Asprin 81 mg taking 1 tablet once a day   START: Nicotine patch (use 14 mg patch for 2 weeks then 7 mg patch for 2 weeks then stop)  If you need a refill on your cardiac medications before your next appointment, please call your pharmacy.   Lab work: TODAY: LIPIDS & A1C   FUTURE: Your physician recommends that you return for a FASTING lipid profile and hepatic function test in 6 weeks   If you have labs (blood work) drawn today and your tests are completely normal, you will receive your results only by:  [place] (if you have MyChart) OR  A paper copy in the mail If you have any lab test that is abnormal or we need to change your treatment, we will call you to review the results.  Testing/Procedures: None   Follow-Up: At [facility], you and your health needs are our priority.  As part of our continuing mission to provide you with exceptional heart care, we have created designated Provider Care Teams.  These Care Teams include your primary Cardiologist (physician) and Advanced Practice Providers (APPs -  Physician Assistants and Nurse Practitioners) who all work together to provide you with the care you need, when you need it. You will need a follow up appointment in:  6 months.  Please call our office 2 months in advance to schedule this appointment.  You may see Dr. Acie Fredrickson or one of the following Advanced Practice Providers on your designated Care Team: Richardson Dopp, PA-C [place], [place]  Daune Perch, NP  Any Other Special Instructions Will Be Listed Below (If Applicable).   Lifestyle Modifications to Prevent and Treat Heart Disease -Recommend heart healthy/Mediterranean diet, with whole grains, fruits, vegetables, fish, lean meats, nuts, olive oil and avocado oil.  -Limit salt intake to less than 2000 mg per day.  -Recommend  moderate walking, starting slowly with a few minutes and working up to 3-5 times/week for 30-50 minutes each session. Aim for at least 150 minutes.week. Goal should be pace of 3 miles/hours, or walking 1.5 miles in 30 minutes -Recommend avoidance of tobacco products. Avoid excess alcohol. -Keep blood pressure well controlled, ideally less than 130/80.   =======================   Gastroesophageal Reflux Disease, Adult Gastroesophageal reflux (GER) happens when acid from the stomach flows up into the tube that connects the mouth and the stomach (esophagus). Normally, food travels down the esophagus and stays in the stomach to be digested. With GER, food and stomach acid sometimes move back up into the esophagus. You may have a disease called gastroesophageal reflux disease (GERD) if the reflux:  Happens often.  Causes frequent or very bad symptoms.  Causes problems such as damage to the esophagus. When this happens, the esophagus becomes sore and swollen (inflamed). Over time, GERD can make small holes (ulcers) in the lining of the esophagus. What are the causes? This condition is caused by a problem with the muscle between the esophagus and the stomach. When this muscle is weak or not normal, it does not close properly to keep food and acid from coming back up from the stomach. The muscle can be weak because of:  Tobacco use.  Pregnancy.  Having a certain type of hernia (hiatal hernia).  Alcohol use.  Certain foods and drinks, such as coffee, chocolate, onions, and peppermint. What increases  the risk? You are more likely to develop this condition if you:  Are overweight.  Have a disease that affects your connective tissue.  Use NSAID medicines. What are the signs or symptoms? Symptoms of this condition include:  Heartburn.  Difficult or painful swallowing.  The feeling of having a lump in the throat.  A bitter taste in the mouth.  Bad breath.  Having a lot of  saliva.  Having an upset or bloated stomach.  Belching.  Chest pain. Different conditions can cause chest pain. Make sure you see your doctor if you have chest pain.  Shortness of breath or noisy breathing (wheezing).  Ongoing (chronic) cough or a cough at night.  Wearing away of the surface of teeth (tooth enamel).  Weight loss. How is this treated? Treatment will depend on how bad your symptoms are. Your doctor may suggest:  Changes to your diet.  Medicine.  Surgery. Follow these instructions at home: Eating and drinking   Follow a diet as told by your doctor. You may need to avoid foods and drinks such as: ? Coffee and tea (with or without caffeine). ? Drinks that contain alcohol. ? Energy drinks and sports drinks. ? Bubbly (carbonated) drinks or sodas. ? Chocolate and cocoa. ? Peppermint and mint flavorings. ? Garlic and onions. ? Horseradish. ? Spicy and acidic foods. These include peppers, chili powder, curry powder, vinegar, hot sauces, and BBQ sauce. ? Citrus fruit juices and citrus fruits, such as oranges, lemons, and limes. ? Tomato-based foods. These include red sauce, chili, salsa, and pizza with red sauce. ? Fried and fatty foods. These include donuts, french fries, potato chips, and high-fat dressings. ? High-fat meats. These include hot dogs, rib eye steak, sausage, ham, and bacon. ? High-fat dairy items, such as whole milk, butter, and cream cheese.  Eat small meals often. Avoid eating large meals.  Avoid drinking large amounts of liquid with your meals.  Avoid eating meals during the 2-3 hours before bedtime.  Avoid lying down right after you eat.  Do not exercise right after you eat. Lifestyle   Do not use any products that contain nicotine or tobacco. These include cigarettes, e-cigarettes, and chewing tobacco. If you need help quitting, ask your doctor.  Try to lower your stress. If you need help doing this, ask your doctor.  If you are  overweight, lose an amount of weight that is healthy for you. Ask your doctor about a safe weight loss goal. General instructions  Pay attention to any changes in your symptoms.  Take over-the-counter and prescription medicines only as told by your doctor. Do not take aspirin, ibuprofen, or other NSAIDs unless your doctor says it is okay.  Wear loose clothes. Do not wear anything tight around your waist.  Raise (elevate) the head of your bed about 6 inches (15 cm).  Avoid bending over if this makes your symptoms worse.  Keep all follow-up visits as told by your doctor. This is important. Contact a doctor if:  You have new symptoms.  You lose weight and you do not know why.  You have trouble swallowing or it hurts to swallow.  You have wheezing or a cough that keeps happening.  Your symptoms do not get better with treatment.  You have a hoarse voice. Get help right away if:  You have pain in your arms, neck, jaw, teeth, or back.  You feel sweaty, dizzy, or light-headed.  You have chest pain or shortness of breath.  You  throw up (vomit) and your throw-up looks like blood or coffee grounds.  You pass out (faint).  Your poop (stool) is bloody or black.  You cannot swallow, drink, or eat. Summary  If a person has gastroesophageal reflux disease (GERD), food and stomach acid move back up into the esophagus and cause symptoms or problems such as damage to the esophagus.  Treatment will depend on how bad your symptoms are.  Follow a diet as told by your doctor.  Take all medicines only as told by your doctor. This information is not intended to replace advice given to you by your health care provider. Make sure you discuss any questions you have with your health care provider. Document Released: 06/13/2007 Document Revised: 07/03/2017 Document Reviewed: 07/03/2017 Elsevier Patient Education  2020 [employer].

## 2018-10-02 NOTE — Progress Notes (Signed)
Cardiology Office Note:    Date:  10/02/2018   ID:  Bernestine Amass, DOB 12-10-1980, MRN [id number]  PCP:  Patient, No Pcp Per  Cardiologist:  Kristeen Miss, MD  Referring MD: No ref. provider found   Chief Complaint  Patient presents with   Coronary Artery Disease    History of Present Illness:    Kyle Silva is a 38 y.o. male with a past medical history significant for tobacco abuse, positive family history for CAD, obesity, GERD.  The patient was seen in the hospital 07/14/2014 with mildly elevated troponin.  He underwent cardiac catheterization which showed 40% mid to distal LAD stenosis, otherwise no significant CAD.  He had hyperdynamic left ventricular systolic function with EF greater than 65%.  No regional wall motion abnormality on echocardiogram.  Was started on aspirin and a statin.  He was bradycardic so beta-blocker was stopped.  The patient was to follow-up in the office, however he never came in to be seen in the office.  The patient was seen in the ED on 09/18/2018 with abdominal pain and acute ruptured, walled off appendicitis with abscess requiring laparoscopic appendectomy and drainage of abscess.  During that hospitalization Dr. Rhona Raider noted the patient's history of CAD, not having been seen by cardiology since 2016.  He arranged for patient to be seen back in the office to reestablish care.  The patient is here today alone but we face timed his girlfriend. 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 He does not exercise. He is able to climb several flights of stairs with ease. No chest pain, shortness of breath, orthopnea, PND, edema, lightheadedness, syncope. He has acid reflux with warm, uncomfortable feeling in his chest when he lays down.  Triggered by certain foods.  Nexium helps if he takes it before he eats.  This is been an issue for years.  The patient does not have a PCP and has not had work-up for GERD.  I  advised him to get a primary care physician and have this looked into since it has been a chronic issue.  He tested positive for COVID about 3 months ago when he was exposed to his step daughter who tested positive. He never had any symptoms.    Cardiac studies  Echocardiogram 07/13/2014 Study Conclusions  - Left ventricle: The cavity size was normal. Wall thickness was   normal. Systolic function was normal. The estimated ejection   fraction was in the range of 60% to 65%. Wall motion was normal;   there were no regional wall motion abnormalities. Left   ventricular diastolic function parameters were normal.  Left Heart Cath and Coronary Angiography 07/13/2014  Conclusion 1. 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 2. There is hyperdynamic left ventricular systolic function. 3. 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 Hyperdynamic LV with mid cavity obliteration but normal LVEDP. The LV wall thickness does appear to be increased.  Likely not CAD related troponin elevation.  Recommendation:  Standard post radial cath care with TR band removal  2-D Echocardiogram to evaluate for possible Hypertrophic Cardiomyopathy  Risk factor modification.  Anticipate discharge either later on today or tomorrow depending on stability and chest pain relief       Past Medical History:  Diagnosis Date   Acute appendicitis 09/18/2018  Acute diverticulitis 08/30/2015   Anginal pain ([place]) 07/13/2014   "mild"   Chest pain    Current smoker 07/13/2014   Diverticulitis    Epididymitis    Family history of adverse reaction to anesthesia    "dad had allergic reaction to anesthesia"    Family history of premature CAD 07/13/2014   GERD (gastroesophageal reflux disease)    MI (mitral incompetence)    Obese 07/13/2014   Ruptured appendicitis 09/18/2018   Unstable angina ([place])  07/13/2014    Past Surgical History:  Procedure Laterality Date   CARDIAC CATHETERIZATION N/A 07/13/2014   Procedure: Left Heart Cath and Coronary Angiography;  Surgeon: Leonie Man, MD;  Location: [facility];  Service: Cardiovascular;  Laterality: N/A;   CYSTECTOMY Left ~ 2014   "wrist"   CYSTECTOMY Left ~ 2014   "2 on my foot""   LAPAROSCOPIC APPENDECTOMY N/A 09/18/2018   Procedure: APPENDECTOMY LAPAROSCOPIC;  Surgeon: Johnathan Hausen, MD;  Location: [facility];  Service: General;  Laterality: N/A;    Current Medications: Current Meds  Medication Sig   docusate sodium (COLACE) 100 MG capsule Take 1 capsule (100 mg total) by mouth 2 (two) times daily.   HYDROcodone-acetaminophen (NORCO/VICODIN) 5-325 MG tablet Take 1 tablet by mouth every 6 (six) hours as needed for moderate pain.   polyethylene glycol (MIRALAX / GLYCOLAX) 17 g packet Take 17 g by mouth daily as needed for mild constipation.     Allergies:   Penicillins   Social History   Socioeconomic History   Marital status: Divorced    Spouse name: Not on file   Number of children: Not on file   Years of education: Not on file   Highest education level: Not on file  Occupational History   Not on file  Social Needs   Financial resource strain: Not on file   Food insecurity    Worry: Not on file    Inability: Not on file   Transportation needs    Medical: Not on file    Non-medical: Not on file  Tobacco Use   Smoking status: Current Every Day Smoker    Packs/day: 0.50    Years: 0.00    Pack years: 0.00    Types: Cigarettes   Smokeless tobacco: Never Used  Substance and Sexual Activity   Alcohol use: No   Drug use: No   Sexual activity: Yes  Lifestyle   Physical activity    Days per week: Not on file    Minutes per session: Not on file   Stress: Not on file  Relationships   Social connections    Talks on phone: Not on file    Gets together: Not on file    Attends religious  service: Not on file    Active member of club or organization: Not on file    Attends meetings of clubs or organizations: Not on file    Relationship status: Not on file  Other Topics Concern   Not on file  Social History Narrative   Not on file     Family History: The patient's family history includes AAA (abdominal aortic aneurysm) in his mother; Diabetes in his mother; Heart failure in his father; Hypertension in his mother; Stroke in his father. ROS:   Please see the history of present illness.     All other systems reviewed and are negative.   EKG:  EKG is  ordered today.  The ekg ordered today demonstrates sinus rhythm,  89 bpm, inferior and lateral T wave inversions which were previously present in 2019 and intermittently in 2017.  Recent Labs: 09/19/2018: ALT 34 09/20/2018: BUN 6; Creatinine, Ser 0.99; Potassium 3.8; Sodium 138 09/22/2018: Hemoglobin 12.9; Platelets 470   Recent Lipid Panel    Component Value Date/Time   CHOL 216 (H) 07/13/2014 1205   TRIG 168 (H) 07/13/2014 1205   HDL 39 (L) 07/13/2014 1205   CHOLHDL 5.5 07/13/2014 1205   VLDL 34 07/13/2014 1205   LDLCALC 143 (H) 07/13/2014 1205    Physical Exam:    VS:  BP 122/80    Pulse 86    Ht  (1.778 m)    Wt 259 lb (117.5 kg)    SpO2 98%    BMI 37.16 kg/m     Wt Readings from Last 3 Encounters:  10/02/18 259 lb (117.5 kg)  09/18/18 255 lb (115.7 kg)  05/27/18 260 lb (117.9 kg)     Physical Exam  Constitutional: He is oriented to person, place, and time. He appears well-developed and well-nourished. No distress.  Obese male  HENT:  Head: Normocephalic and atraumatic.  Neck: Normal range of motion. Neck supple. No JVD present.  Cardiovascular: Normal rate, regular rhythm, normal heart sounds and intact distal pulses. Exam reveals no gallop and no friction rub.  No murmur heard. Pulmonary/Chest: Effort normal and breath sounds normal. No respiratory distress. He has no wheezes. He has no rales.    Abdominal: Soft. Bowel sounds are normal.  Musculoskeletal: Normal range of motion.        General: No edema.  Neurological: He is alert and oriented to person, place, and time.  Skin: Skin is warm and dry.  Psychiatric: He has a normal mood and affect. His behavior is normal. Judgment and thought content normal.  Vitals reviewed.    ASSESSMENT:    1. Coronary artery disease involving native coronary artery of native heart without angina pectoris   2. Current smoker   3. Hyperlipidemia, unspecified hyperlipidemia type   4. Class 2 obesity due to excess calories without serious comorbidity with body mass index (BMI) of 37.0 to 37.9 in adult    PLAN:    In order of problems listed above:  CAD -CVD risk factors are obesity, smoking, hyperlipidemia.  No hypertension.  No known diabetes. -Mild with 40% mid LAD stenosis in 07/2014. -Patient has chronic T wave changes.  He is quite active with no anginal symptoms at all. -Patient was previously started on aspirin and statin in 2016 but he did not continue this. -We will check lipid panel and A1c for risk stratification.  With his coronary artery plaque, will reinitiate Lipitor 40 mg daily.  We will follow-up lipid panel and LFTs in 6 weeks. -We had extensive discussion on risk factor modification including weight loss, smoking cessation, exercise, heart healthy diet.  His girlfriend was also present by phone for this discussion and she says that she will try to get him on track.  -Lifestyle Modifications to Prevent and Treat Heart Disease -Recommend heart healthy/Mediterranean diet, with whole grains, fruits, vegetables, fish, lean meats, nuts, olive oil and avocado oil.  -Limit salt intake to less than 2000 mg per day.  -Recommend moderate walking, starting slowly with a few minutes and working up to 3-5 times/week for 30-50 minutes each session. Aim for at least 150 minutes.week. Goal should be pace of 3 miles/hours, or walking 1.5 miles  in 30 minutes -Recommend avoidance of tobacco products. Avoid  excess alcohol. -Keep blood pressure well controlled, ideally less than 130/80.   Tobacco abuse -Patient smokes approximately 1/3 pack/day.  He states the value in quitting smoking but is not confident that he can.  We discussed the use of nicotine patch for physical withdrawal and behavior changes to help with cessation.  Hyperlipidemia -LDL was 143 in 07/2014 when patient was found to have a 40% LAD stenosis on cardiac cath.  The patient stopped taking his medications and did not follow-up. -We will check lipid panel today (LFTs recently normal) for baseline and restart atorvastatin 40 mg daily.  We will follow-up lipid panel and LFTs in 6 weeks. -Patient also advised that smoking cessation can help his cholesterol levels.  Obesity -Body mass index is 37.16 kg/m.  -Advised on weight loss with better food choices and portion control.  We had an in-depth discussion on heart healthy diet.  GERD -Patient has longstanding significant esophageal reflux symptoms for which he takes as needed Nexium. -I will provide Protonix 40 mg daily and advised patient to obtain a primary care provider as he may need further investigation.  Medication Adjustments/Labs and Tests Ordered: Current medicines are reviewed at length with the patient today.  Concerns regarding medicines are outlined above. Labs and tests ordered and medication changes are outlined in the patient instructions below:  Patient Instructions  Medication Instructions:  START: Atorvastatin 40 mg taking 1 tablet once a day   START: Protonix 40 mg taking 1 tablet once a day  START: Asprin 81 mg taking 1 tablet once a day   START: Nicotine patch (use 14 mg patch for 2 weeks then 7 mg patch for 2 weeks then stop)  If you need a refill on your cardiac medications before your next appointment, please call your pharmacy.   Lab work: TODAY: LIPIDS & A1C   FUTURE: Your  physician recommends that you return for a FASTING lipid profile and hepatic function test in 6 weeks   If you have labs (blood work) drawn today and your tests are completely normal, you will receive your results only by:  MyChart Message (if you have MyChart) OR  A paper copy in the mail If you have any lab test that is abnormal or we need to change your treatment, we will call you to review the results.  Testing/Procedures: None   Follow-Up: At [facility], you and your health needs are our priority.  As part of our continuing mission to provide you with exceptional heart care, we have created designated Provider Care Teams.  These Care Teams include your primary Cardiologist (physician) and Advanced Practice Providers (APPs -  Physician Assistants and Nurse Practitioners) who all work together to provide you with the care you need, when you need it. You will need a follow up appointment in:  6 months.  Please call our office 2 months in advance to schedule this appointment.  You may see Dr. Elease Hashimoto or one of the following Advanced Practice Providers on your designated Care Team: Tereso Newcomer, PA-C [place], [place]  Berton Bon, NP  Any Other Special Instructions Will Be Listed Below (If Applicable).   Lifestyle Modifications to Prevent and Treat Heart Disease -Recommend heart healthy/Mediterranean diet, with whole grains, fruits, vegetables, fish, lean meats, nuts, olive oil and avocado oil.  -Limit salt intake to less than 2000 mg per day.  -Recommend moderate walking, starting slowly with a few minutes and working up to 3-5 times/week for 30-50 minutes each session. Aim for  at least 150 minutes.week. Goal should be pace of 3 miles/hours, or walking 1.5 miles in 30 minutes -Recommend avoidance of tobacco products. Avoid excess alcohol. -Keep blood pressure well controlled, ideally less than 130/80.   =======================   Gastroesophageal Reflux Disease,  Adult Gastroesophageal reflux (GER) happens when acid from the stomach flows up into the tube that connects the mouth and the stomach (esophagus). Normally, food travels down the esophagus and stays in the stomach to be digested. With GER, food and stomach acid sometimes move back up into the esophagus. You may have a disease called gastroesophageal reflux disease (GERD) if the reflux:  Happens often.  Causes frequent or very bad symptoms.  Causes problems such as damage to the esophagus. When this happens, the esophagus becomes sore and swollen (inflamed). Over time, GERD can make small holes (ulcers) in the lining of the esophagus. What are the causes? This condition is caused by a problem with the muscle between the esophagus and the stomach. When this muscle is weak or not normal, it does not close properly to keep food and acid from coming back up from the stomach. The muscle can be weak because of:  Tobacco use.  Pregnancy.  Having a certain type of hernia (hiatal hernia).  Alcohol use.  Certain foods and drinks, such as coffee, chocolate, onions, and peppermint. What increases the risk? You are more likely to develop this condition if you:  Are overweight.  Have a disease that affects your connective tissue.  Use NSAID medicines. What are the signs or symptoms? Symptoms of this condition include:  Heartburn.  Difficult or painful swallowing.  The feeling of having a lump in the throat.  A bitter taste in the mouth.  Bad breath.  Having a lot of saliva.  Having an upset or bloated stomach.  Belching.  Chest pain. Different conditions can cause chest pain. Make sure you see your doctor if you have chest pain.  Shortness of breath or noisy breathing (wheezing).  Ongoing (chronic) cough or a cough at night.  Wearing away of the surface of teeth (tooth enamel).  Weight loss. How is this treated? Treatment will depend on how bad your symptoms are. Your  doctor may suggest:  Changes to your diet.  Medicine.  Surgery. Follow these instructions at home: Eating and drinking   Follow a diet as told by your doctor. You may need to avoid foods and drinks such as: ? Coffee and tea (with or without caffeine). ? Drinks that contain alcohol. ? Energy drinks and sports drinks. ? Bubbly (carbonated) drinks or sodas. ? Chocolate and cocoa. ? Peppermint and mint flavorings. ? Garlic and onions. ? Horseradish. ? Spicy and acidic foods. These include peppers, chili powder, curry powder, vinegar, hot sauces, and BBQ sauce. ? Citrus fruit juices and citrus fruits, such as oranges, lemons, and limes. ? Tomato-based foods. These include red sauce, chili, salsa, and pizza with red sauce. ? Fried and fatty foods. These include donuts, french fries, potato chips, and high-fat dressings. ? High-fat meats. These include hot dogs, rib eye steak, sausage, ham, and bacon. ? High-fat dairy items, such as whole milk, butter, and cream cheese.  Eat small meals often. Avoid eating large meals.  Avoid drinking large amounts of liquid with your meals.  Avoid eating meals during the 2-3 hours before bedtime.  Avoid lying down right after you eat.  Do not exercise right after you eat. Lifestyle   Do not use any products that contain  nicotine or tobacco. These include cigarettes, e-cigarettes, and chewing tobacco. If you need help quitting, ask your doctor.  Try to lower your stress. If you need help doing this, ask your doctor.  If you are overweight, lose an amount of weight that is healthy for you. Ask your doctor about a safe weight loss goal. General instructions  Pay attention to any changes in your symptoms.  Take over-the-counter and prescription medicines only as told by your doctor. Do not take aspirin, ibuprofen, or other NSAIDs unless your doctor says it is okay.  Wear loose clothes. Do not wear anything tight around your waist.  Raise  (elevate) the head of your bed about 6 inches (15 cm).  Avoid bending over if this makes your symptoms worse.  Keep all follow-up visits as told by your doctor. This is important. Contact a doctor if:  You have new symptoms.  You lose weight and you do not know why.  You have trouble swallowing or it hurts to swallow.  You have wheezing or a cough that keeps happening.  Your symptoms do not get better with treatment.  You have a hoarse voice. Get help right away if:  You have pain in your arms, neck, jaw, teeth, or back.  You feel sweaty, dizzy, or light-headed.  You have chest pain or shortness of breath.  You throw up (vomit) and your throw-up looks like blood or coffee grounds.  You pass out (faint).  Your poop (stool) is bloody or black.  You cannot swallow, drink, or eat. Summary  If a person has gastroesophageal reflux disease (GERD), food and stomach acid move back up into the esophagus and cause symptoms or problems such as damage to the esophagus.  Treatment will depend on how bad your symptoms are.  Follow a diet as told by your doctor.  Take all medicines only as told by your doctor. This information is not intended to replace advice given to you by your health care provider. Make sure you discuss any questions you have with your health care provider. Document Released: 06/13/2007 Document Revised: 07/03/2017 Document Reviewed: 07/03/2017 Elsevier Patient Education  [address].     Signed, Berton Bon, NP  10/02/2018 10:17 AM    [facility]

## 2018-10-03 ENCOUNTER — Telehealth: Payer: Self-pay | Admitting: Cardiology

## 2018-10-03 NOTE — Telephone Encounter (Signed)
Follow up   Patient is returning call to lab results. Please call.

## 2018-10-14 ENCOUNTER — Inpatient Hospital Stay (HOSPITAL_COMMUNITY)
Admission: EM | Admit: 2018-10-14 | Discharge: 2018-10-16 | DRG: 862 | Disposition: A | Discharge status: Home / Self Care | Payer: 59 | Attending: General Surgery | Admitting: General Surgery

## 2018-10-14 ENCOUNTER — Emergency Department (HOSPITAL_COMMUNITY): Payer: 59

## 2018-10-14 ENCOUNTER — Other Ambulatory Visit: Payer: Self-pay

## 2018-10-14 ENCOUNTER — Encounter (HOSPITAL_COMMUNITY): Payer: Self-pay

## 2018-10-14 DIAGNOSIS — Z823 Family history of stroke: Secondary | ICD-10-CM

## 2018-10-14 DIAGNOSIS — Z833 Family history of diabetes mellitus: Secondary | ICD-10-CM

## 2018-10-14 DIAGNOSIS — D649 Anemia, unspecified: Secondary | ICD-10-CM | POA: Diagnosis present

## 2018-10-14 DIAGNOSIS — Z880 Allergy status to penicillin: Secondary | ICD-10-CM

## 2018-10-14 DIAGNOSIS — E669 Obesity, unspecified: Secondary | ICD-10-CM | POA: Diagnosis present

## 2018-10-14 DIAGNOSIS — I2510 Atherosclerotic heart disease of native coronary artery without angina pectoris: Secondary | ICD-10-CM | POA: Diagnosis present

## 2018-10-14 DIAGNOSIS — K219 Gastro-esophageal reflux disease without esophagitis: Secondary | ICD-10-CM | POA: Diagnosis present

## 2018-10-14 DIAGNOSIS — T8143XA Infection following a procedure, organ and space surgical site, initial encounter: Secondary | ICD-10-CM | POA: Diagnosis not present

## 2018-10-14 DIAGNOSIS — E785 Hyperlipidemia, unspecified: Secondary | ICD-10-CM | POA: Diagnosis present

## 2018-10-14 DIAGNOSIS — Z87891 Personal history of nicotine dependence: Secondary | ICD-10-CM

## 2018-10-14 DIAGNOSIS — Z9049 Acquired absence of other specified parts of digestive tract: Secondary | ICD-10-CM

## 2018-10-14 DIAGNOSIS — R188 Other ascites: Secondary | ICD-10-CM

## 2018-10-14 DIAGNOSIS — R109 Unspecified abdominal pain: Secondary | ICD-10-CM | POA: Diagnosis not present

## 2018-10-14 DIAGNOSIS — A419 Sepsis, unspecified organism: Secondary | ICD-10-CM

## 2018-10-14 DIAGNOSIS — Z8249 Family history of ischemic heart disease and other diseases of the circulatory system: Secondary | ICD-10-CM

## 2018-10-14 DIAGNOSIS — F17200 Nicotine dependence, unspecified, uncomplicated: Secondary | ICD-10-CM | POA: Diagnosis present

## 2018-10-14 DIAGNOSIS — Z6837 Body mass index (BMI) 37.0-37.9, adult: Secondary | ICD-10-CM

## 2018-10-14 DIAGNOSIS — K651 Peritoneal abscess: Secondary | ICD-10-CM | POA: Diagnosis present

## 2018-10-14 DIAGNOSIS — U071 COVID-19: Secondary | ICD-10-CM | POA: Diagnosis present

## 2018-10-14 LAB — COMPREHENSIVE METABOLIC PANEL
ALT: 43 U/L (ref 0–44)
AST: 35 U/L (ref 15–41)
Albumin: 3.8 g/dL (ref 3.5–5.0)
Alkaline Phosphatase: 71 U/L (ref 38–126)
Anion gap: 9 (ref 5–15)
BUN: 10 mg/dL (ref 6–20)
CO2: 25 mmol/L (ref 22–32)
Calcium: 8.9 mg/dL (ref 8.9–10.3)
Chloride: 102 mmol/L (ref 98–111)
Creatinine, Ser: 0.82 mg/dL (ref 0.61–1.24)
GFR calc Af Amer: >60 mL/min (ref >60)
GFR calc non Af Amer: >60 mL/min (ref >60)
Glucose, Bld: 77 mg/dL (ref 70–99)
Potassium: 5 mmol/L (ref 3.5–5.1)
Sodium: 136 mmol/L (ref 135–145)
Total Bilirubin: 0.9 mg/dL (ref 0.3–1.2)
Total Protein: 8 g/dL (ref 6.5–8.1)

## 2018-10-14 LAB — URINALYSIS, ROUTINE W REFLEX MICROSCOPIC
Bilirubin Urine: NEGATIVE
Glucose, UA: NEGATIVE mg/dL
Hgb urine dipstick: NEGATIVE
Ketones, ur: NEGATIVE mg/dL
Leukocytes,Ua: NEGATIVE
Nitrite: NEGATIVE
Protein, ur: NEGATIVE mg/dL
Specific Gravity, Urine: 1.019 (ref 1.005–1.030)
pH: 7 (ref 5.0–8.0)

## 2018-10-14 LAB — CBC
HCT: 43.1 % (ref 39.0–52.0)
Hemoglobin: 14.4 g/dL (ref 13.0–17.0)
MCH: 30.4 pg (ref 26.0–34.0)
MCHC: 33.4 g/dL (ref 30.0–36.0)
MCV: 90.9 fL (ref 80.0–100.0)
Platelets: 240 10*3/uL (ref 150–400)
RBC: 4.74 MIL/uL (ref 4.22–5.81)
RDW: 13.2 % (ref 11.5–15.5)
WBC: 11.8 10*3/uL — ABNORMAL HIGH (ref 4.0–10.5)
nRBC: 0 % (ref 0.0–0.2)

## 2018-10-14 LAB — LACTIC ACID, PLASMA: Lactic Acid, Venous: 1.2 mmol/L (ref 0.5–1.9)

## 2018-10-14 LAB — SARS CORONAVIRUS 2 BY RT PCR (HOSPITAL ORDER, PERFORMED IN ~~LOC~~ HOSPITAL LAB): SARS Coronavirus 2: POSITIVE — AB

## 2018-10-14 LAB — LIPASE, BLOOD: Lipase: 33 U/L (ref 11–51)

## 2018-10-14 IMAGING — CT CT ABD-PELV W/ CM
2 of 5 series · 16 of 46 positions shown, 18 images · IV contrast (OMNIPAQUE 300)
Comparison: [DATE]

CLINICAL DATA: Abdominal pain, distention, and fever. 3 weeks
status post appendectomy.

EXAM:
CT ABDOMEN AND PELVIS WITH CONTRAST
TECHNIQUE: Multidetector CT imaging of the abdomen and pelvis was performed
using the standard protocol following bolus administration of
intravenous contrast.
CONTRAST:  100mL OMNIPAQUE IOHEXOL 300 MG/ML  SOLN

[Series 2: axial st · axial · 0.82mm/px · z∈[-325,+105]mm · 13 of 102 slices shown, 15 images]
[im 8/102  soft-tissue]
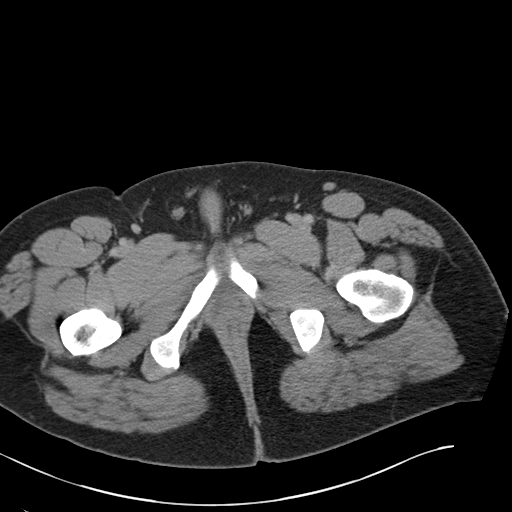
[im 8/102  bone]
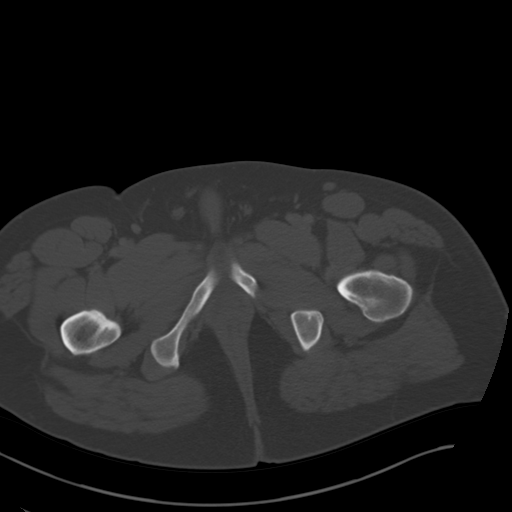
[im 15/102  soft-tissue]
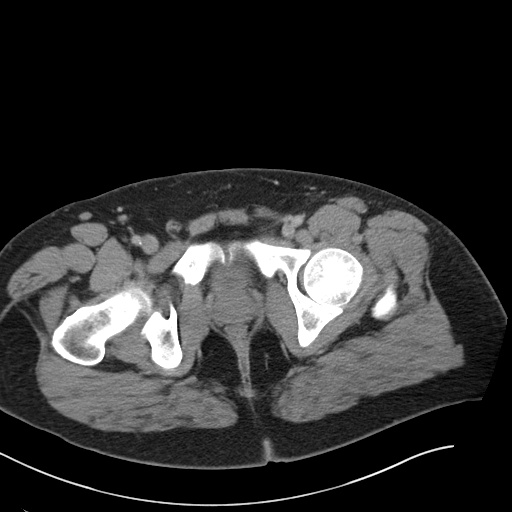
[im 22/102  soft-tissue]
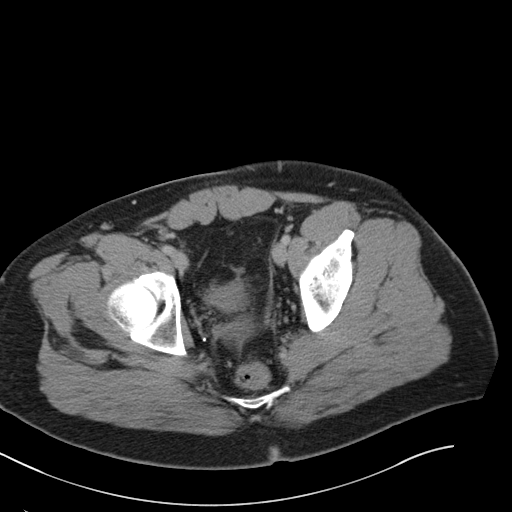
[im 29/102  soft-tissue]
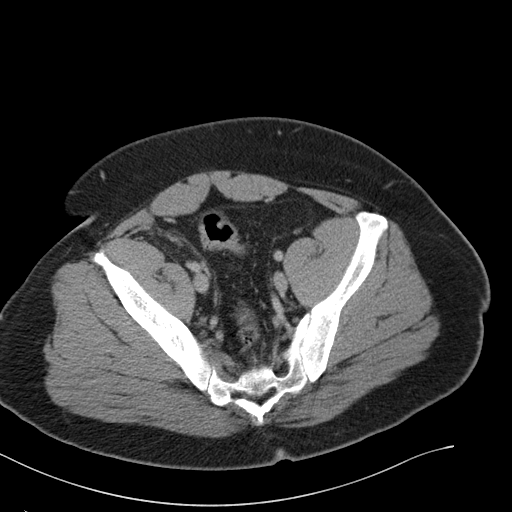
[im 37/102  soft-tissue]
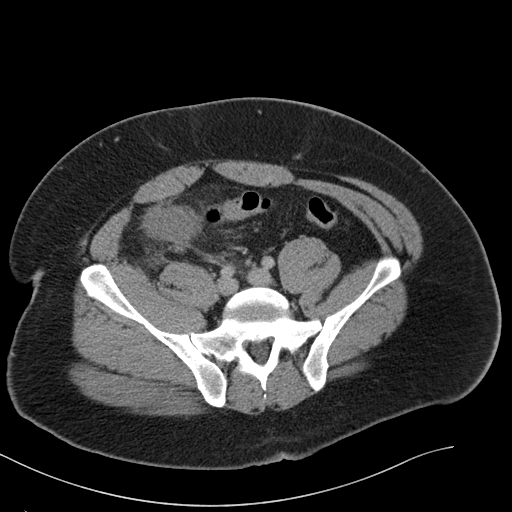
[im 44/102  soft-tissue]
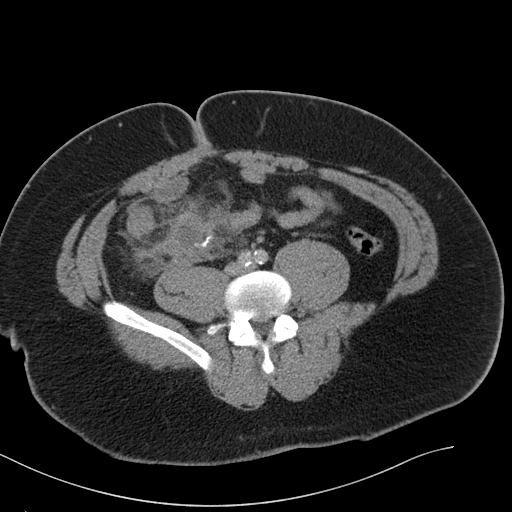
[im 51/102  soft-tissue]
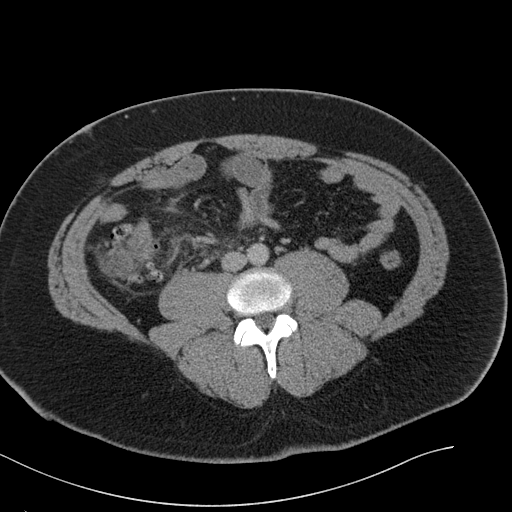
[im 58/102  soft-tissue]
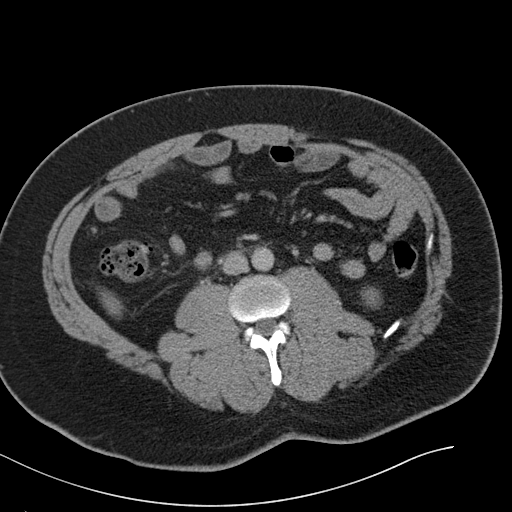
[im 65/102  soft-tissue]
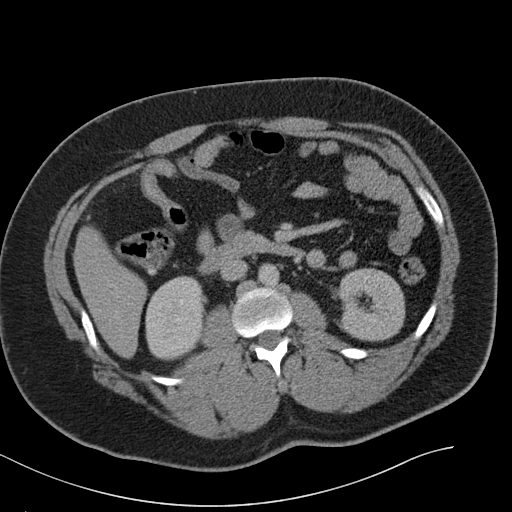
[im 65/102  bone]
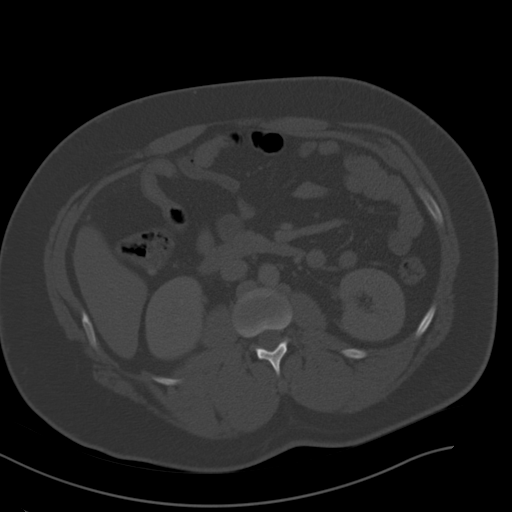
[im 73/102  soft-tissue]
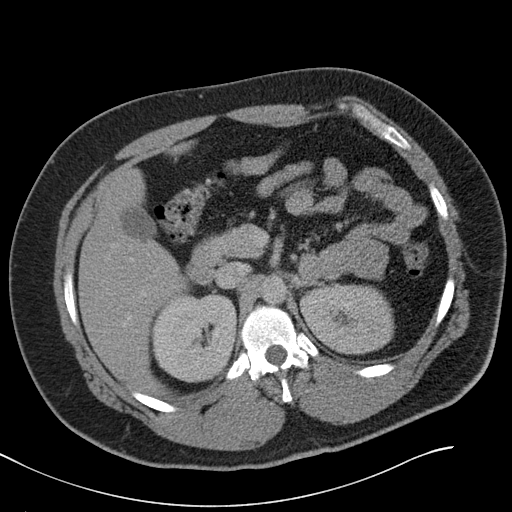
[im 80/102  soft-tissue]
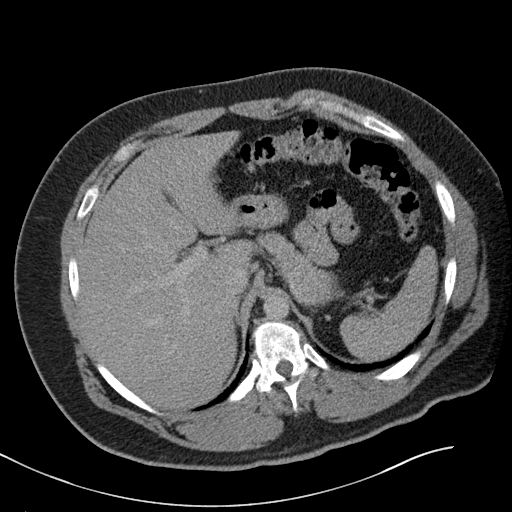
[im 87/102  soft-tissue]
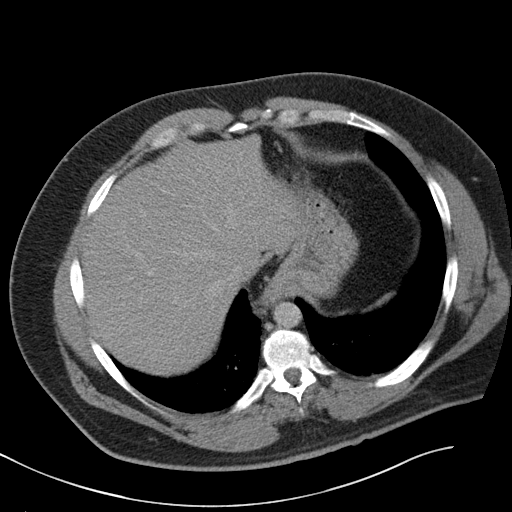
[im 94/102  soft-tissue]
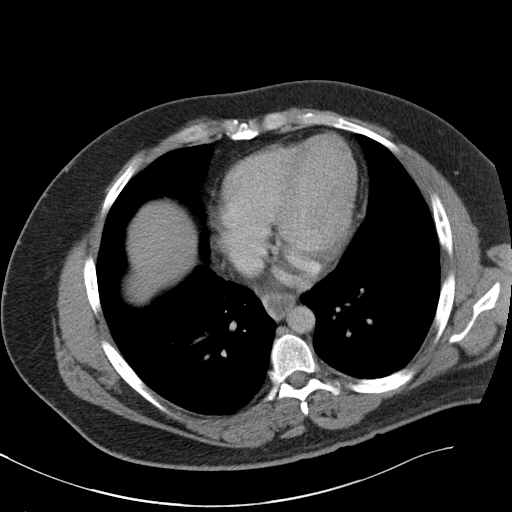

[Series 5: coronal st · coronal · 0.77mm/px · 3 of 151 slices shown]
[im 51/151  soft-tissue]
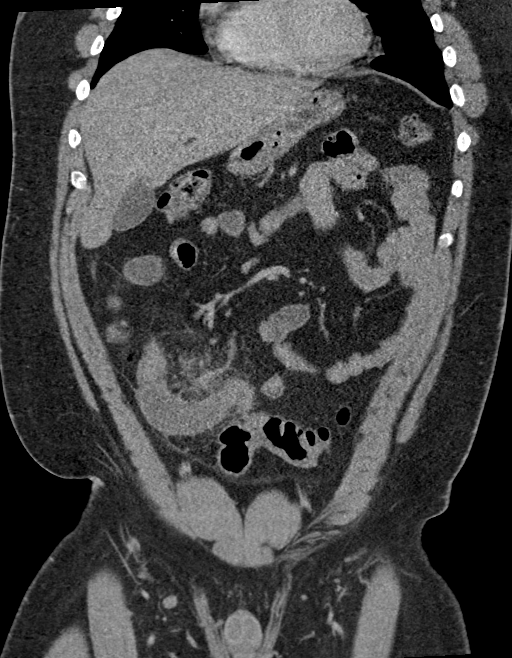
[im 67/151  soft-tissue]
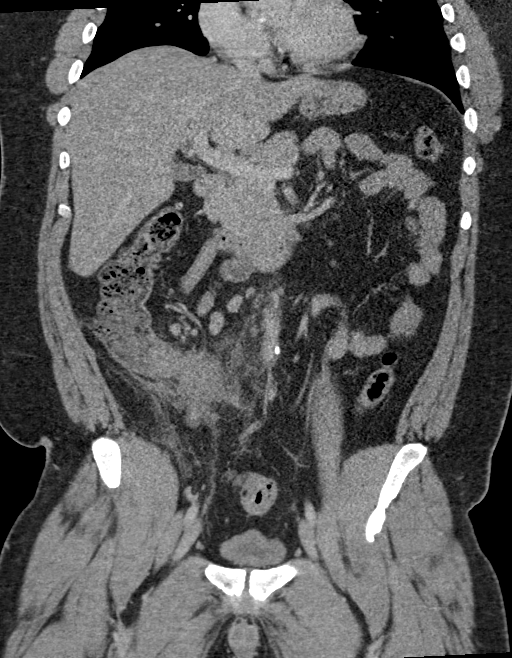
[im 84/151  soft-tissue]
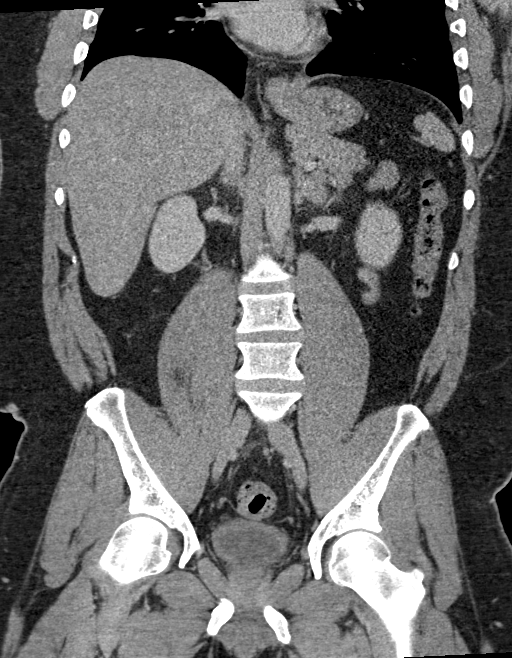

[16 of 46 positions shown; findings below may reference images not displayed]

FINDINGS: Lower Chest: No acute findings.

Hepatobiliary: No hepatic masses identified. Mild hepatic steatosis.
Gallbladder is unremarkable. No evidence of biliary ductal
dilatation.

Pancreas:  No mass or inflammatory changes.

Spleen: Within normal limits in size and appearance.

Adrenals/Urinary Tract: No masses identified. No evidence of
hydronephrosis.

Stomach/Bowel: Postop changes from appendectomy noted. A rim
enhancing fluid collection is seen within the right lower quadrant
mesentery which measures 4.0 x 3.2 cm, consistent with abscess.
Surrounding inflammatory changes are seen. Mild dilatation and wall
thickening is seen involving adjacent distal small bowel, consistent
with focal ileus. No evidence of bowel obstruction. Right colonic
diverticulosis again seen, however there is no evidence of
diverticulitis.

Vascular/Lymphatic: No pathologically enlarged lymph nodes. No
abdominal aortic aneurysm.

Reproductive:  No mass or other significant abnormality.

Other:  None.

Musculoskeletal:  No suspicious bone lesions identified.
IMPRESSION: 4.0 cm abscess in right lower quadrant mesentery, with surrounding
inflammatory changes.

Focal ileus involving right lower quadrant distal small bowel loops.

Right colonic diverticulosis, without radiographic evidence of
diverticulitis.

Mild hepatic steatosis.

## 2018-10-14 MED ORDER — METRONIDAZOLE IN NACL 5-0.79 MG/ML-% IV SOLN
500.0000 mg | Freq: Once | INTRAVENOUS | Status: AC
  Administered 2018-10-14: 500 mg via INTRAVENOUS
  Filled 2018-10-14: qty 100

## 2018-10-14 MED ORDER — HYDROCODONE-ACETAMINOPHEN 5-325 MG PO TABS
1.0000 | ORAL_TABLET | ORAL | Status: DC | PRN
  Administered 2018-10-16: 2 via ORAL
  Filled 2018-10-14: qty 2

## 2018-10-14 MED ORDER — ACETAMINOPHEN 650 MG RE SUPP: 650.0000 mg | Freq: Four times a day (QID) | RECTAL | Status: DC | PRN

## 2018-10-14 MED ORDER — IOHEXOL 300 MG/ML  SOLN
100.0000 mL | Freq: Once | INTRAMUSCULAR | Status: AC | PRN
  Administered 2018-10-14: 100 mL via INTRAVENOUS

## 2018-10-14 MED ORDER — MORPHINE SULFATE (PF) 4 MG/ML IV SOLN
4.0000 mg | Freq: Once | INTRAVENOUS | Status: AC
  Administered 2018-10-14: 19:00:00 4 mg via INTRAVENOUS
  Filled 2018-10-14: qty 1

## 2018-10-14 MED ORDER — KCL IN DEXTROSE-NACL 20-5-0.45 MEQ/L-%-% IV SOLN: INTRAVENOUS | Status: DC

## 2018-10-14 MED ORDER — ACETAMINOPHEN 325 MG PO TABS
650.0000 mg | ORAL_TABLET | Freq: Four times a day (QID) | ORAL | Status: DC | PRN
  Administered 2018-10-15: 650 mg via ORAL
  Filled 2018-10-14: qty 2

## 2018-10-14 MED ORDER — SODIUM CHLORIDE 0.9 % IV BOLUS
1000.0000 mL | Freq: Once | INTRAVENOUS | Status: AC
  Administered 2018-10-14: 19:00:00 1000 mL via INTRAVENOUS

## 2018-10-14 MED ORDER — ONDANSETRON HCL 4 MG PO TABS: 4.0000 mg | ORAL_TABLET | Freq: Four times a day (QID) | ORAL | Status: DC | PRN

## 2018-10-14 MED ORDER — HYDROMORPHONE HCL 1 MG/ML IJ SOLN
1.0000 mg | INTRAMUSCULAR | Status: DC | PRN
  Administered 2018-10-15 – 2018-10-16 (×6): 1 mg via INTRAVENOUS
  Filled 2018-10-14 (×6): qty 1

## 2018-10-14 MED ORDER — CIPROFLOXACIN IN D5W 400 MG/200ML IV SOLN
400.0000 mg | Freq: Once | INTRAVENOUS | Status: AC
  Administered 2018-10-14: 22:00:00 400 mg via INTRAVENOUS
  Filled 2018-10-14: qty 200

## 2018-10-14 MED ORDER — ONDANSETRON HCL 4 MG/2ML IJ SOLN: 4.0000 mg | Freq: Four times a day (QID) | INTRAMUSCULAR | Status: DC | PRN

## 2018-10-14 MED ORDER — KCL IN DEXTROSE-NACL 20-5-0.45 MEQ/L-%-% IV SOLN
INTRAVENOUS | Status: DC
  Administered 2018-10-15 – 2018-10-16 (×3): via INTRAVENOUS
  Filled 2018-10-14 (×3): qty 1000

## 2018-10-14 MED ORDER — ONDANSETRON 4 MG PO TBDP
4.0000 mg | ORAL_TABLET | Freq: Four times a day (QID) | ORAL | Status: DC | PRN
  Administered 2018-10-16: 4 mg via ORAL
  Filled 2018-10-14: qty 1

## 2018-10-14 MED ORDER — ONDANSETRON HCL 4 MG/2ML IJ SOLN
4.0000 mg | Freq: Four times a day (QID) | INTRAMUSCULAR | Status: DC | PRN
  Administered 2018-10-15: 4 mg via INTRAVENOUS
  Filled 2018-10-14: qty 2

## 2018-10-14 MED ORDER — ACETAMINOPHEN 325 MG PO TABS
650.0000 mg | ORAL_TABLET | Freq: Once | ORAL | Status: AC
  Administered 2018-10-14: 19:00:00 650 mg via ORAL
  Filled 2018-10-14: qty 2

## 2018-10-14 MED ORDER — SODIUM CHLORIDE 0.9% FLUSH
3.0000 mL | Freq: Once | INTRAVENOUS | Status: AC
  Administered 2018-10-14: 3 mL via INTRAVENOUS

## 2018-10-14 MED ORDER — ACETAMINOPHEN 325 MG PO TABS: 650.0000 mg | ORAL_TABLET | Freq: Four times a day (QID) | ORAL | Status: DC | PRN

## 2018-10-14 NOTE — ED Notes (Signed)
X-ray at bedside

## 2018-10-14 NOTE — ED Triage Notes (Signed)
Patient reports that he had his appendix removed 3 weeks ago. Patient states he went to his follow up appointment yesterday. Patient is scheduled for CT abd on 10/16/18, but began to have a fever and increased pain

## 2018-10-14 NOTE — H&P (Signed)
Kyle Silva is an 38 y.o. male.    General Surgery [facility]  Chief Complaint: intra-abdominal abscess, hx of perforated appendicitis  HPI: Patient is a 38 year old male who underwent laparoscopic appendectomy with drain placement for perforated appendicitis with abscess on September 18, 2018 by Dr. Johnathan Hausen.  Patient was treated with intravenous antibiotics.  He was discharged home with his drain in place.  Drain was removed on September 18.  Patient completed his course of antibiotics.  Patient had been doing well until approximately 3 days ago when he began to develop right lower quadrant abdominal pain with a low-grade fever.  Patient presented today to the emergency department for evaluation.  White blood cell count is elevated at 11.8.  Temperature was 101.0 degrees.  CT scan of the abdomen and pelvis demonstrates a 4 cm abscess near the site of appendectomy adjacent to the small bowel mesentery with secondary inflammatory changes in the distal small bowel.  General surgery is called for evaluation and management.  Past Medical History:  Diagnosis Date  . Acute appendicitis 09/18/2018  . Acute diverticulitis 08/30/2015  . CAD (coronary artery disease)    Mild with 40% LAD stenosis by cath in 2016  . Chest pain   . Current smoker 07/13/2014  . Diverticulitis   . Epididymitis   . Family history of adverse reaction to anesthesia    "dad had allergic reaction to anesthesia"   . GERD (gastroesophageal reflux disease)   . Obese 07/13/2014  . Ruptured appendicitis 09/18/2018    Past Surgical History:  Procedure Laterality Date  . CARDIAC CATHETERIZATION N/A 07/13/2014   Procedure: Left Heart Cath and Coronary Angiography;  Surgeon: Leonie Man, MD;  Location: [place] [facility];  Service: Cardiovascular;  Laterality: N/A;  . CYSTECTOMY Left ~ 2014   "wrist"  . CYSTECTOMY Left ~ 2014   "2 on my foot""  . LAPAROSCOPIC APPENDECTOMY N/A 09/18/2018   Procedure:  APPENDECTOMY LAPAROSCOPIC;  Surgeon: Johnathan Hausen, MD;  Location: [facility];  Service: General;  Laterality: N/A;    Family History  Problem Relation Age of Onset  . AAA (abdominal aortic aneurysm) Mother   . Hypertension Mother   . Diabetes Mother   . Heart failure Father   . Stroke Father    Social History:  reports that he has quit smoking. His smoking use included cigarettes. He smoked 0.50 packs per day for 0.00 years. He has never used smokeless tobacco. He reports that he does not drink alcohol or use drugs.  Allergies:  Allergies  Allergen Reactions  . Penicillins Anaphylaxis    Has patient had a PCN reaction causing immediate rash, facial/tongue/throat swelling, SOB or lightheadedness with hypotension:Yes Has patient had a PCN reaction causing severe rash involving mucus membranes or skin necrosis:unsure Has patient had a PCN reaction that required hospitalization:Yes Has patient had a PCN reaction occurring within the last 10 years:Yes If all of the above answers are "NO", then may proceed with Cephalosporin use.      (Not in a hospital admission)   Results for orders placed or performed during the hospital encounter of 10/14/18 (from the past 48 hour(s))  Lipase, blood     Status: None   Collection Time: 10/14/18  6:26 PM  Result Value Ref Range   Lipase 33 11 - 51 U/L    Comment: Performed at [facility], [place] [address], [place], [place] [address]  Comprehensive metabolic panel  Status: None   Collection Time: 10/14/18  6:26 PM  Result Value Ref Range   Sodium 136 135 - 145 mmol/L   Potassium 5.0 3.5 - 5.1 mmol/L   Chloride 102 98 - 111 mmol/L   CO2 25 22 - 32 mmol/L   Glucose, Bld 77 70 - 99 mg/dL   BUN 10 6 - 20 mg/dL   Creatinine, Ser 1.610.82 0.61 - 1.24 mg/dL   Calcium 8.9 8.9 - 09.610.3 mg/dL   Total Protein 8.0 6.5 - 8.1 g/dL   Albumin 3.8 3.5 - 5.0 g/dL   AST 35 15 - 41 U/L   ALT 43 0 - 44 U/L   Alkaline Phosphatase 71 38 - 126 U/L    Total Bilirubin 0.9 0.3 - 1.2 mg/dL   GFR calc non Af Amer >60 >60 mL/min   GFR calc Af Amer >60 >60 mL/min   Anion gap 9 5 - 15    Comment: Performed at [facility], [address] [place], [place] [address]  CBC     Status: Abnormal   Collection Time: 10/14/18  6:26 PM  Result Value Ref Range   WBC 11.8 (H) 4.0 - 10.5 K/uL   RBC 4.74 4.22 - 5.81 MIL/uL   Hemoglobin 14.4 13.0 - 17.0 g/dL   HCT 98.143.1 19.139.0 - 47.852.0 %   MCV 90.9 80.0 - 100.0 fL   MCH 30.4 26.0 - 34.0 pg   MCHC 33.4 30.0 - 36.0 g/dL   RDW 29.513.2 62.111.5 - 30.815.5 %   Platelets 240 150 - 400 K/uL   nRBC 0.0 0.0 - 0.2 %    Comment: Performed at [facility], [address] [place], [place] [address]  Lactic acid, plasma     Status: None   Collection Time: 10/14/18  7:36 PM  Result Value Ref Range   Lactic Acid, Venous 1.2 0.5 - 1.9 mmol/L    Comment: Performed at [facility], [address] [place], [place] [address]  Urinalysis, Routine w reflex microscopic     Status: None   Collection Time: 10/14/18  8:21 PM  Result Value Ref Range   Color, Urine YELLOW YELLOW   APPearance CLEAR CLEAR   Specific Gravity, Urine 1.019 1.005 - 1.030   pH 7.0 5.0 - 8.0   Glucose, UA NEGATIVE NEGATIVE mg/dL   Hgb urine dipstick NEGATIVE NEGATIVE   Bilirubin Urine NEGATIVE NEGATIVE   Ketones, ur NEGATIVE NEGATIVE mg/dL   Protein, ur NEGATIVE NEGATIVE mg/dL   Nitrite NEGATIVE NEGATIVE   Leukocytes,Ua NEGATIVE NEGATIVE    Comment: Performed at [facility], [address] [place], [place] [address]  SARS Coronavirus 2 [facility](Hospital order, Performed in [facility] hospital lab) Nasopharyngeal Urine, Clean Catch     Status: Abnormal   Collection Time: 10/14/18  8:21 PM   Specimen: Urine, Clean Catch; Nasopharyngeal  Result Value Ref Range   SARS Coronavirus 2 POSITIVE (A) NEGATIVE    Comment: RESULT CALLED TO, READ BACK BY AND VERIFIED WITH: Thurman CoyerJENNIFER OXIDINE, RN @  [phone] ON 10/14/2018 C VARNER (NOTE) If result is NEGATIVE SARS-CoV-2 target nucleic acids are NOT DETECTED. The SARS-CoV-2 RNA is generally detectable in upper and lower  respiratory specimens during the acute phase of infection. The lowest  concentration of SARS-CoV-2 viral copies this assay can detect is 250  copies / mL. A negative result does not preclude SARS-CoV-2 infection  and should not be used as the sole basis for  treatment or other  patient management decisions.  A negative result may occur with  improper specimen collection / handling, submission of specimen other  than nasopharyngeal swab, presence of viral mutation(s) within the  areas targeted by this assay, and inadequate number of viral copies  (<250 copies / mL). A negative result must be combined with clinical  observations, patient history, and epidemiological information. If result is POSITIVE SARS-CoV-2 target nucleic acids ar e DETECTED. The SARS-CoV-2 RNA is generally detectable in upper and lower  respiratory specimens during the acute phase of infection.  Positive  results are indicative of active infection with SARS-CoV-2.  Clinical  correlation with patient history and other diagnostic information is  necessary to determine patient infection status.  Positive results do  not rule out bacterial infection or co-infection with other viruses. If result is PRESUMPTIVE POSTIVE SARS-CoV-2 nucleic acids MAY BE PRESENT.   A presumptive positive result was obtained on the submitted specimen  and confirmed on repeat testing.  While 2019 novel coronavirus  (SARS-CoV-2) nucleic acids may be present in the submitted sample  additional confirmatory testing may be necessary for epidemiological  and / or clinical management purposes  to differentiate between  SARS-CoV-2 and other Sarbecovirus currently known to infect humans.  If clinically indicated additional testing with an alternate test  methodolog y [phone]) is  advised. The SARS-CoV-2 RNA is generally  detectable in upper and lower respiratory specimens during the acute  phase of infection. The expected result is Negative. Fact Sheet for Patients:  BoilerBrush.com.cy Fact Sheet for Healthcare Providers: https://pope.com/ This test is not yet approved or cleared by the [place] FDA and has been authorized for detection and/or diagnosis of SARS-CoV-2 by FDA under an Emergency Use Authorization (EUA).  This EUA will remain in effect (meaning this test can be used) for the duration of the COVID-19 declaration under Section 564(b)(1) of the Act, 21 U.S.C. section 360bbb-3(b)(1), unless the authorization is terminated or revoked sooner. Performed at [facility], [address], [place], [place] [address]    Ct Abdomen Pelvis W Contrast  Result Date: 10/14/2018 CLINICAL DATA:  Abdominal pain, distention, and fever. 3 weeks status post appendectomy. EXAM: CT ABDOMEN AND PELVIS WITH CONTRAST TECHNIQUE: Multidetector CT imaging of the abdomen and pelvis was performed using the standard protocol following bolus administration of intravenous contrast. CONTRAST:  OMNIPAQUE IOHEXOL 300 MG/ML  SOLN COMPARISON:  09/18/2018 FINDINGS: Lower Chest: No acute findings. Hepatobiliary: No hepatic masses identified. Mild hepatic steatosis. Gallbladder is unremarkable. No evidence of biliary ductal dilatation. Pancreas:  No mass or inflammatory changes. Spleen: Within normal limits in size and appearance. Adrenals/Urinary Tract: No masses identified. No evidence of hydronephrosis. Stomach/Bowel: Postop changes from appendectomy noted. A rim enhancing fluid collection is seen within the right lower quadrant mesentery which measures 4.0 x 3.2 cm, consistent with abscess. Surrounding inflammatory changes are seen. Mild dilatation and wall thickening is seen involving adjacent distal small bowel,  consistent with focal ileus. No evidence of bowel obstruction. Right colonic diverticulosis again seen, however there is no evidence of diverticulitis. Vascular/Lymphatic: No pathologically enlarged lymph nodes. No abdominal aortic aneurysm. Reproductive:  No mass or other significant abnormality. Other:  None. Musculoskeletal:  No suspicious bone lesions identified. IMPRESSION: 4.0 cm abscess in right lower quadrant mesentery, with surrounding inflammatory changes. Focal ileus involving right lower quadrant distal small bowel loops. Right colonic diverticulosis, without radiographic evidence of diverticulitis. Mild hepatic steatosis. Electronically Signed   By: Mayra Neer  Eppie Gibson M.D.   On: 10/14/2018 20:55   Dg Chest Port 1 View  Result Date: 10/14/2018 CLINICAL DATA:  Fever. EXAM: PORTABLE CHEST 1 VIEW COMPARISON:  08/13/2015 FINDINGS: The heart size and mediastinal contours are within normal limits. Both lungs are clear. The visualized skeletal structures are unremarkable. IMPRESSION: Negative.  No active disease. Electronically Signed   By: Danae Orleans M.D.   On: 10/14/2018 20:37    Review of Systems  Constitutional: Positive for chills, diaphoresis and fever.  HENT: Negative.   Eyes: Negative.   Respiratory: Negative.   Cardiovascular: Positive for chest pain.  Gastrointestinal: Positive for abdominal pain and nausea.  Genitourinary: Negative.   Musculoskeletal: Negative.   Skin: Negative.   Neurological: Negative.   Endo/Heme/Allergies: Negative.   Psychiatric/Behavioral: Negative.     Blood pressure 115/75, pulse 98, temperature (!) 101 F (38.3 C), temperature source Oral, resp. rate 16, height 5\' 10"  (1.778 m), weight 117.5 kg, SpO2 95 %. Physical Exam  Constitutional: He is oriented to person, place, and time. He appears well-developed and well-nourished. No distress.  HENT:  Head: Normocephalic and atraumatic.  Right Ear: External ear normal.  Left Ear: External ear normal.   Patient is wearing a mask  Eyes: Pupils are equal, round, and reactive to light. Conjunctivae are normal. No scleral icterus.  Neck: Normal range of motion. Neck supple. No JVD present. No tracheal deviation present. No thyromegaly present.  Cardiovascular: Normal rate, regular rhythm, normal heart sounds and intact distal pulses.  No murmur heard. Respiratory: Effort normal and breath sounds normal. No respiratory distress. He has no wheezes.  GI: Soft. Bowel sounds are normal. He exhibits no distension and no mass. There is abdominal tenderness (Right lower quadrant). There is guarding. There is no rebound.  Well-healed surgical incisions  Musculoskeletal: Normal range of motion.        General: No deformity or edema.  Lymphadenopathy:    He has no cervical adenopathy.  Neurological: He is alert and oriented to person, place, and time.  Skin: Skin is warm and dry. He is not diaphoretic.  Psychiatric: He has a normal mood and affect. His behavior is normal.     Assessment/Plan Intra-abdominal abscess, history of perforated appendicitis  Admit to surgical service  Begin IV abx's  IV hydration, NPO after MN for possible procedure tomorrow  Consult to interventional radiology tomorrow morning for percutaneous drainage  , MD [facility] Office: [phone]    [phone], MD 10/14/2018, 10:05 PM

## 2018-10-14 NOTE — ED Notes (Signed)
Patient given urinal. Patient made aware we need urine sample.

## 2018-10-14 NOTE — ED Provider Notes (Signed)
[facility] DEPT Provider Note   CSN: [id number] Arrival date & time: 10/14/18  1732     History   Chief Complaint No chief complaint on file.   HPI Burtis Imhoff is a 38 y.o. male hx of CAD, recent appendicitis s/p appendectomy, here with fever, abdominal pain.  Patient had appendicitis about a month ago and had appendectomy on September 10th.  He states that he was doing well after the surgery until this morning when he had worsening lower abdominal pain .  He also developed low-grade temperature at home eventually went up to 102 in triage.  He denies any cough or shortness of breath.  Denies any COVID exposure recently.  He denies any vomiting or diarrhea.      The history is provided by the patient.    Past Medical History:  Diagnosis Date  . Acute appendicitis 09/18/2018  . Acute diverticulitis 08/30/2015  . CAD (coronary artery disease)    Mild with 40% LAD stenosis by cath in 2016  . Chest pain   . Current smoker 07/13/2014  . Diverticulitis   . Epididymitis   . Family history of adverse reaction to anesthesia    "dad had allergic reaction to anesthesia"   . GERD (gastroesophageal reflux disease)   . Obese 07/13/2014  . Ruptured appendicitis 09/18/2018    Patient Active Problem List   Diagnosis Date Noted  . Acute appendicitis 09/18/2018  . Ruptured appendicitis 09/18/2018  . Acute diverticulitis 08/30/2015  . Current smoker 07/13/2014  . Family history of premature CAD 07/13/2014  . Obese 07/13/2014  . Unstable angina ([place]) 07/13/2014  . Chest pain     Past Surgical History:  Procedure Laterality Date  . CARDIAC CATHETERIZATION N/A 07/13/2014   Procedure: Left Heart Cath and Coronary Angiography;  Surgeon: Leonie Man, MD;  Location: [place] [facility];  Service: Cardiovascular;  Laterality: N/A;  . CYSTECTOMY Left ~ 2014   "wrist"  . CYSTECTOMY Left ~ 2014   "2 on my foot""  . LAPAROSCOPIC APPENDECTOMY N/A 09/18/2018   Procedure: APPENDECTOMY LAPAROSCOPIC;  Surgeon: Johnathan Hausen, MD;  Location: [facility];  Service: General;  Laterality: N/A;        Home Medications    Prior to Admission medications   Medication Sig Start Date End Date Taking? Authorizing Provider  atorvastatin (LIPITOR) 40 MG tablet Take 1 tablet (40 mg total) by mouth daily. 10/02/18   Daune Perch, NP  docusate sodium (COLACE) 100 MG capsule Take 1 capsule (100 mg total) by mouth 2 (two) times daily. 09/22/18   Focht, Fraser Din, PA  HYDROcodone-acetaminophen (NORCO/VICODIN) 5-325 MG tablet Take 1 tablet by mouth every 6 (six) hours as needed for moderate pain. 09/22/18   Focht, Fraser Din, PA  pantoprazole (PROTONIX) 40 MG tablet Take 1 tablet (40 mg total) by mouth daily. 10/02/18   Daune Perch, NP  polyethylene glycol (MIRALAX / GLYCOLAX) 17 g packet Take 17 g by mouth daily as needed for mild constipation. 09/22/18   Kalman Drape, PA    Family History Family History  Problem Relation Age of Onset  . AAA (abdominal aortic aneurysm) Mother   . Hypertension Mother   . Diabetes Mother   . Heart failure Father   . Stroke Father     Social History Social History   Tobacco Use  . Smoking status: Former Smoker    Packs/day: 0.50    Years: 0.00    Pack years: 0.00  Types: Cigarettes  . Smokeless tobacco: Never Used  Substance Use Topics  . Alcohol use: No  . Drug use: No     Allergies   Penicillins   Review of Systems Review of Systems  Constitutional: Positive for fever.  Gastrointestinal: Positive for abdominal pain.  All other systems reviewed and are negative.    Physical Exam Updated Vital Signs BP 125/80   Pulse (!) 106   Temp (!) 101 F (38.3 C) (Oral)   Resp 18   Ht 5\' 10"  (1.778 m)   Wt 117.5 kg   SpO2 92%   BMI 37.16 kg/m   Physical Exam Vitals signs and nursing note reviewed.  Constitutional:      Comments: Uncomfortable   HENT:     Head: Normocephalic.     Nose: Nose normal.      Mouth/Throat:     Mouth: Mucous membranes are moist.  Eyes:     Extraocular Movements: Extraocular movements intact.     Pupils: Pupils are equal, round, and reactive to light.  Neck:     Musculoskeletal: Normal range of motion.  Cardiovascular:     Rate and Rhythm: Normal rate and regular rhythm.  Pulmonary:     Effort: Pulmonary effort is normal.     Breath sounds: Normal breath sounds.  Abdominal:     General: Abdomen is flat.     Comments: + RLQ tenderness, surgical scars healing well   Musculoskeletal: Normal range of motion.  Skin:    General: Skin is warm.     Capillary Refill: Capillary refill takes less than 2 seconds.  Neurological:     General: No focal deficit present.     Mental Status: He is alert.  Psychiatric:        Mood and Affect: Mood normal.      ED Treatments / Results  Labs (all labs ordered are listed, but only abnormal results are displayed) Labs Reviewed  CBC - Abnormal; Notable for the following components:      Result Value   WBC 11.8 (*)    All other components within normal limits  CULTURE, BLOOD (ROUTINE X 2)  CULTURE, BLOOD (ROUTINE X 2)  SARS CORONAVIRUS 2 (HOSPITAL ORDER, PERFORMED IN [facility])  LIPASE, BLOOD  COMPREHENSIVE METABOLIC PANEL  URINALYSIS, ROUTINE W REFLEX MICROSCOPIC  LACTIC ACID, PLASMA  LACTIC ACID, PLASMA    EKG None  Radiology No results found.  Procedures Procedures (including critical care time)  CRITICAL CARE Performed by: Richardean Canalavid H Yao   Total critical care time:30 minutes  Critical care time was exclusive of separately billable procedures and treating other patients.  Critical care was necessary to treat or prevent imminent or life-threatening deterioration.  Critical care was time spent personally by me on the following activities: development of treatment plan with patient and/or surrogate as well as nursing, discussions with consultants, evaluation of patient's response to  treatment, examination of patient, obtaining history from patient or surrogate, ordering and performing treatments and interventions, ordering and review of laboratory studies, ordering and review of radiographic studies, pulse oximetry and re-evaluation of patient's condition.   Medications Ordered in ED Medications  sodium chloride flush (NS) 0.9 % injection 3 mL (3 mLs Intravenous Given 10/14/18 1853)  sodium chloride 0.9 % bolus 1,000 mL (1,000 mLs Intravenous New Bag/Given 10/14/18 1923)  acetaminophen (TYLENOL) tablet 650 mg (650 mg Oral Given 10/14/18 1919)  morphine 4 MG/ML injection 4 mg (4 mg Intravenous Given 10/14/18 1923)  Initial Impression / Assessment and Plan / ED Course  I have reviewed the triage vital signs and the nursing notes.  Pertinent labs & imaging results that were available during my care of the patient were reviewed by me and considered in my medical decision making (see chart for details).       Evan Mackie is a 38 y.o. male here with RLQ pain, fever. Had recent appendectomy. Consider colitis vs stump appendicitis vs periappendiceal abscess. Will get labs, lactate, cultures, CT ab/pel.   9:35 PM Labs showed WBC 12. CT showed abscess around the appendectomy site. Given IV abx. Will admit for sepsis from intra abdominal abscess. Surgery to admit    Final Clinical Impressions(s) / ED Diagnoses   Final diagnoses:  None    ED Discharge Orders    None       Charlynne Pander, MD 10/14/18 2136

## 2018-10-15 DIAGNOSIS — Z9049 Acquired absence of other specified parts of digestive tract: Secondary | ICD-10-CM | POA: Diagnosis not present

## 2018-10-15 DIAGNOSIS — Z823 Family history of stroke: Secondary | ICD-10-CM | POA: Diagnosis not present

## 2018-10-15 DIAGNOSIS — K651 Peritoneal abscess: Secondary | ICD-10-CM | POA: Diagnosis present

## 2018-10-15 DIAGNOSIS — Z880 Allergy status to penicillin: Secondary | ICD-10-CM | POA: Diagnosis not present

## 2018-10-15 DIAGNOSIS — Z8249 Family history of ischemic heart disease and other diseases of the circulatory system: Secondary | ICD-10-CM | POA: Diagnosis not present

## 2018-10-15 DIAGNOSIS — U071 COVID-19: Secondary | ICD-10-CM | POA: Diagnosis present

## 2018-10-15 DIAGNOSIS — E669 Obesity, unspecified: Secondary | ICD-10-CM | POA: Diagnosis present

## 2018-10-15 DIAGNOSIS — E785 Hyperlipidemia, unspecified: Secondary | ICD-10-CM | POA: Diagnosis present

## 2018-10-15 DIAGNOSIS — Z833 Family history of diabetes mellitus: Secondary | ICD-10-CM | POA: Diagnosis not present

## 2018-10-15 DIAGNOSIS — D649 Anemia, unspecified: Secondary | ICD-10-CM | POA: Diagnosis present

## 2018-10-15 DIAGNOSIS — T8143XA Infection following a procedure, organ and space surgical site, initial encounter: Secondary | ICD-10-CM | POA: Diagnosis present

## 2018-10-15 DIAGNOSIS — R109 Unspecified abdominal pain: Secondary | ICD-10-CM | POA: Diagnosis present

## 2018-10-15 DIAGNOSIS — Z87891 Personal history of nicotine dependence: Secondary | ICD-10-CM | POA: Diagnosis not present

## 2018-10-15 DIAGNOSIS — K219 Gastro-esophageal reflux disease without esophagitis: Secondary | ICD-10-CM | POA: Diagnosis present

## 2018-10-15 DIAGNOSIS — Z6837 Body mass index (BMI) 37.0-37.9, adult: Secondary | ICD-10-CM | POA: Diagnosis not present

## 2018-10-15 DIAGNOSIS — F17200 Nicotine dependence, unspecified, uncomplicated: Secondary | ICD-10-CM | POA: Diagnosis not present

## 2018-10-15 DIAGNOSIS — I2510 Atherosclerotic heart disease of native coronary artery without angina pectoris: Secondary | ICD-10-CM | POA: Diagnosis present

## 2018-10-15 LAB — CBC
HCT: 39.7 % (ref 39.0–52.0)
Hemoglobin: 12.9 g/dL — ABNORMAL LOW (ref 13.0–17.0)
MCH: 29.9 pg (ref 26.0–34.0)
MCHC: 32.5 g/dL (ref 30.0–36.0)
MCV: 92.1 fL (ref 80.0–100.0)
Platelets: 231 10*3/uL (ref 150–400)
RBC: 4.31 MIL/uL (ref 4.22–5.81)
RDW: 13.2 % (ref 11.5–15.5)
WBC: 9.8 10*3/uL (ref 4.0–10.5)
nRBC: 0 % (ref 0.0–0.2)

## 2018-10-15 LAB — LACTATE DEHYDROGENASE: LDH: 88 U/L — ABNORMAL LOW (ref 98–192)

## 2018-10-15 LAB — LACTIC ACID, PLASMA: Lactic Acid, Venous: 1.1 mmol/L (ref 0.5–1.9)

## 2018-10-15 LAB — ABO/RH: ABO/RH(D): AB POS

## 2018-10-15 LAB — C-REACTIVE PROTEIN: CRP: 17.7 mg/dL — ABNORMAL HIGH (ref <1.0)

## 2018-10-15 LAB — FERRITIN: Ferritin: 256 ng/mL (ref 24–336)

## 2018-10-15 LAB — D-DIMER, QUANTITATIVE: D-Dimer, Quant: 2.05 ug/mL-FEU — ABNORMAL HIGH (ref 0.00–0.50)

## 2018-10-15 MED ORDER — ALUM & MAG HYDROXIDE-SIMETH 200-200-20 MG/5ML PO SUSP
30.0000 mL | ORAL | Status: DC | PRN
  Administered 2018-10-15: 30 mL via ORAL
  Filled 2018-10-15: qty 30

## 2018-10-15 MED ORDER — ZINC SULFATE 220 (50 ZN) MG PO CAPS
220.0000 mg | ORAL_CAPSULE | Freq: Every day | ORAL | Status: DC
  Administered 2018-10-15 – 2018-10-16 (×2): 220 mg via ORAL
  Filled 2018-10-15 (×2): qty 1

## 2018-10-15 MED ORDER — VITAMIN C 500 MG PO TABS
500.0000 mg | ORAL_TABLET | Freq: Every day | ORAL | Status: DC
  Administered 2018-10-15 – 2018-10-16 (×2): 500 mg via ORAL
  Filled 2018-10-15 (×2): qty 1

## 2018-10-15 MED ORDER — GUAIFENESIN-DM 100-10 MG/5ML PO SYRP: 10.0000 mL | ORAL_SOLUTION | ORAL | Status: DC | PRN

## 2018-10-15 MED ORDER — CIPROFLOXACIN IN D5W 400 MG/200ML IV SOLN
400.0000 mg | Freq: Two times a day (BID) | INTRAVENOUS | Status: DC
  Administered 2018-10-15 – 2018-10-16 (×3): 400 mg via INTRAVENOUS
  Filled 2018-10-15 (×3): qty 200

## 2018-10-15 MED ORDER — METRONIDAZOLE IN NACL 5-0.79 MG/ML-% IV SOLN
500.0000 mg | Freq: Three times a day (TID) | INTRAVENOUS | Status: DC
  Administered 2018-10-15 – 2018-10-16 (×5): 500 mg via INTRAVENOUS
  Filled 2018-10-15 (×5): qty 100

## 2018-10-15 MED ORDER — ALBUTEROL SULFATE HFA 108 (90 BASE) MCG/ACT IN AERS
2.0000 | INHALATION_SPRAY | Freq: Four times a day (QID) | RESPIRATORY_TRACT | Status: DC | PRN
  Filled 2018-10-15: qty 6.7

## 2018-10-15 MED ORDER — NICOTINE 14 MG/24HR TD PT24
14.0000 mg | MEDICATED_PATCH | Freq: Every day | TRANSDERMAL | Status: DC
  Administered 2018-10-15 – 2018-10-16 (×2): 14 mg via TRANSDERMAL
  Filled 2018-10-15 (×2): qty 1

## 2018-10-15 MED ORDER — ENOXAPARIN SODIUM 60 MG/0.6ML ~~LOC~~ SOLN
60.0000 mg | SUBCUTANEOUS | Status: DC
  Administered 2018-10-15 – 2018-10-16 (×2): 60 mg via SUBCUTANEOUS
  Filled 2018-10-15 (×2): qty 0.6

## 2018-10-15 NOTE — ED Notes (Signed)
ED TO INPATIENT HANDOFF REPORT  Name/Age/Gender Kyle Silva 38 y.o. male  Code Status    Code Status Orders  (From admission, onward)         Start     Ordered   10/14/18 2255  Full code  Continuous     10/14/18 2256        Code Status History    Date Active Date Inactive Code Status Order ID Comments User Context   10/14/2018 2214 10/14/2018 2256 Full Code [id number]  Darnell Level, MD ED   09/18/2018 1437 09/18/2018 1928 Full Code [id number]  Franne Forts, PA-C ED   08/30/2015 1615 08/31/2015 1756 Full Code [id number]  Marinda Elk, MD Inpatient   07/13/2014 1047 07/14/2014 1436 Full Code [id number]  Vesta Mixer, MD Inpatient   07/13/2014 1011 07/13/2014 1047 Full Code [id number]  Marykay Lex, MD Inpatient   Advance Care Planning Activity      Home/SNF/Other Home  Chief Complaint post op issue; fever; headache  Level of Care/Admitting Diagnosis ED Disposition    ED Disposition Condition Comment   Admit  Hospital Area: [facility] [[id number]]  Level of Care: Med-Surg [16]  Covid Evaluation: Confirmed COVID Positive  Diagnosis: Intra-abdominal abscess (HCC) [301066]  Admitting Physician: CCS, MD [[id number]]  Attending Physician: CCS, MD [[id number]]  PT Class (Do Not Modify): Observation [104]  PT Acc Code (Do Not Modify): Observation [10022]       Medical History Past Medical History:  Diagnosis Date  . Acute appendicitis 09/18/2018  . Acute diverticulitis 08/30/2015  . CAD (coronary artery disease)    Mild with 40% LAD stenosis by cath in 2016  . Chest pain   . Current smoker 07/13/2014  . Diverticulitis   . Epididymitis   . Family history of adverse reaction to anesthesia    "dad had allergic reaction to anesthesia"   . GERD (gastroesophageal reflux disease)   . Obese 07/13/2014  . Ruptured appendicitis 09/18/2018    Allergies Allergies  Allergen Reactions  . Penicillins Anaphylaxis    Has patient had a PCN reaction causing immediate rash,  facial/tongue/throat swelling, SOB or lightheadedness with hypotension:Yes Has patient had a PCN reaction causing severe rash involving mucus membranes or skin necrosis:unsure Has patient had a PCN reaction that required hospitalization:Yes Has patient had a PCN reaction occurring within the last 10 years:Yes If all of the above answers are "NO", then may proceed with Cephalosporin use.      IV Location/Drains/Wounds Patient Lines/Drains/Airways Status   Active Line/Drains/Airways    Name:   Placement date:   Placement time:   Site:   Days:   Peripheral IV 10/14/18 Left Hand   10/14/18    1853    Hand   1   Peripheral IV 10/14/18 Right Antecubital   10/14/18    1936    Antecubital   1   Closed System Drain 1 Right RUQ Bulb (JP) 19 Fr.   09/18/18    1744    RUQ   27   Incision - 3 Ports Abdomen 1: Right;Lateral;Upper 2: Umbilicus 3: Left;Lateral;Lower   09/18/18    1645     27          Labs/Imaging Results for orders placed or performed during the hospital encounter of 10/14/18 (from the past 48 hour(s))  Lipase, blood     Status: None   Collection Time: 10/14/18  6:26 PM  Result Value Ref Range   Lipase 33  11 - 51 U/L    Comment: Performed at [facility], [place] [address], [place], [place] [address]  Comprehensive metabolic panel     Status: None   Collection Time: 10/14/18  6:26 PM  Result Value Ref Range   Sodium 136 135 - 145 mmol/L   Potassium 5.0 3.5 - 5.1 mmol/L   Chloride 102 98 - 111 mmol/L   CO2 25 22 - 32 mmol/L   Glucose, Bld 77 70 - 99 mg/dL   BUN 10 6 - 20 mg/dL   Creatinine, Ser 0.82 0.61 - 1.24 mg/dL   Calcium 8.9 8.9 - 10.3 mg/dL   Total Protein 8.0 6.5 - 8.1 g/dL   Albumin 3.8 3.5 - 5.0 g/dL   AST 35 15 - 41 U/L   ALT 43 0 - 44 U/L   Alkaline Phosphatase 71 38 - 126 U/L   Total Bilirubin 0.9 0.3 - 1.2 mg/dL   GFR calc non Af Amer >60 >60 mL/min   GFR calc Af Amer >60 >60 mL/min   Anion gap 9 5 - 15    Comment: Performed at [facility], [place] [address], [place], [place] [address]  CBC     Status: Abnormal   Collection Time: 10/14/18  6:26 PM  Result Value Ref Range   WBC 11.8 (H) 4.0 - 10.5 K/uL   RBC 4.74 4.22 - 5.81 MIL/uL   Hemoglobin 14.4 13.0 - 17.0 g/dL   HCT 43.1 39.0 - 52.0 %   MCV 90.9 80.0 - 100.0 fL   MCH 30.4 26.0 - 34.0 pg   MCHC 33.4 30.0 - 36.0 g/dL   RDW 13.2 11.5 - 15.5 %   Platelets 240 150 - 400 K/uL   nRBC 0.0 0.0 - 0.2 %    Comment: Performed at [facility], [place] [address], [place], [place] [address]  Lactic acid, plasma     Status: None   Collection Time: 10/14/18  7:36 PM  Result Value Ref Range   Lactic Acid, Venous 1.2 0.5 - 1.9 mmol/L    Comment: Performed at [facility], [place] [address], [place], [place] [address]  Urinalysis, Routine w reflex microscopic     Status: None   Collection Time: 10/14/18  8:21 PM  Result Value Ref Range   Color, Urine YELLOW YELLOW   APPearance CLEAR CLEAR   Specific Gravity, Urine 1.019 1.005 - 1.030   pH 7.0 5.0 - 8.0   Glucose, UA NEGATIVE NEGATIVE mg/dL   Hgb urine dipstick NEGATIVE NEGATIVE   Bilirubin Urine NEGATIVE NEGATIVE   Ketones, ur NEGATIVE NEGATIVE mg/dL   Protein, ur NEGATIVE NEGATIVE mg/dL   Nitrite NEGATIVE NEGATIVE   Leukocytes,Ua NEGATIVE NEGATIVE    Comment: Performed at [place] [address], [place], [place] [address]  SARS Coronavirus 2 [facility] order, Performed in [facility] hospital lab) Nasopharyngeal Urine, Clean Catch     Status: Abnormal   Collection Time: 10/14/18  8:21 PM   Specimen: Urine, Clean Catch; Nasopharyngeal  Result Value Ref Range   SARS Coronavirus 2 POSITIVE (A) NEGATIVE    Comment: RESULT CALLED TO, READ BACK BY AND VERIFIED WITH: Agapito Games, RN @ [phone] ON 10/14/2018 C VARNER (NOTE) If result is NEGATIVE SARS-CoV-2 target nucleic acids are NOT DETECTED. The SARS-CoV-2 RNA is generally detectable in upper and lower   respiratory specimens during the acute phase of infection. The lowest  concentration of SARS-CoV-2 viral copies  this assay can detect is 250  copies / mL. A negative result does not preclude SARS-CoV-2 infection  and should not be used as the sole basis for treatment or other  patient management decisions.  A negative result may occur with  improper specimen collection / handling, submission of specimen other  than nasopharyngeal swab, presence of viral mutation(s) within the  areas targeted by this assay, and inadequate number of viral copies  (<250 copies / mL). A negative result must be combined with clinical  observations, patient history, and epidemiological information. If result is POSITIVE SARS-CoV-2 target nucleic acids ar e DETECTED. The SARS-CoV-2 RNA is generally detectable in upper and lower  respiratory specimens during the acute phase of infection.  Positive  results are indicative of active infection with SARS-CoV-2.  Clinical  correlation with patient history and other diagnostic information is  necessary to determine patient infection status.  Positive results do  not rule out bacterial infection or co-infection with other viruses. If result is PRESUMPTIVE POSTIVE SARS-CoV-2 nucleic acids MAY BE PRESENT.   A presumptive positive result was obtained on the submitted specimen  and confirmed on repeat testing.  While 2019 novel coronavirus  (SARS-CoV-2) nucleic acids may be present in the submitted sample  additional confirmatory testing may be necessary for epidemiological  and / or clinical management purposes  to differentiate between  SARS-CoV-2 and other Sarbecovirus currently known to infect humans.  If clinically indicated additional testing with an alternate test  methodolog y [phone]([id number]) is advised. The SARS-CoV-2 RNA is generally  detectable in upper and lower respiratory specimens during the acute  phase of infection. The expected result is Negative. Fact  Sheet for Patients:  BoilerBrush.com.cyhttps://www.fda.gov/media/136312/download Fact Sheet for Healthcare Providers: https://pope.com/https://www.fda.gov/media/136313/download This test is not yet approved or cleared by the [place] FDA and has been authorized for detection and/or diagnosis of SARS-CoV-2 by FDA under an Emergency Use Authorization (EUA).  This EUA will remain in effect (meaning this test can be used) for the duration of the COVID-19 declaration under Section 564(b)(1) of the Act, 21 U.S.C. section 360bbb-3(b)(1), unless the authorization is terminated or revoked sooner. Performed at [facility], [address] [place], [place] [id number]    Ct Abdomen Pelvis W Contrast  Result Date: 10/14/2018 CLINICAL DATA:  Abdominal pain, distention, and fever. 3 weeks status post appendectomy. EXAM: CT ABDOMEN AND PELVIS WITH CONTRAST TECHNIQUE: Multidetector CT imaging of the abdomen and pelvis was performed using the standard protocol following bolus administration of intravenous contrast. CONTRAST:  100mL OMNIPAQUE IOHEXOL 300 MG/ML  SOLN COMPARISON:  09/18/2018 FINDINGS: Lower Chest: No acute findings. Hepatobiliary: No hepatic masses identified. Mild hepatic steatosis. Gallbladder is unremarkable. No evidence of biliary ductal dilatation. Pancreas:  No mass or inflammatory changes. Spleen: Within normal limits in size and appearance. Adrenals/Urinary Tract: No masses identified. No evidence of hydronephrosis. Stomach/Bowel: Postop changes from appendectomy noted. A rim enhancing fluid collection is seen within the right lower quadrant mesentery which measures 4.0 x 3.2 cm, consistent with abscess. Surrounding inflammatory changes are seen. Mild dilatation and wall thickening is seen involving adjacent distal small bowel, consistent with focal ileus. No evidence of bowel obstruction. Right colonic diverticulosis again seen, however there is no evidence of diverticulitis. Vascular/Lymphatic: No  pathologically enlarged lymph nodes. No abdominal aortic aneurysm. Reproductive:  No mass or other significant abnormality. Other:  None. Musculoskeletal:  No suspicious bone lesions identified. IMPRESSION: 4.0 cm abscess in right lower quadrant mesentery, with surrounding inflammatory  changes. Focal ileus involving right lower quadrant distal small bowel loops. Right colonic diverticulosis, without radiographic evidence of diverticulitis. Mild hepatic steatosis. Electronically Signed   By: Danae Orleans M.D.   On: 10/14/2018 20:55   Dg Chest Port 1 View  Result Date: 10/14/2018 CLINICAL DATA:  Fever. EXAM: PORTABLE CHEST 1 VIEW COMPARISON:  08/13/2015 FINDINGS: The heart size and mediastinal contours are within normal limits. Both lungs are clear. The visualized skeletal structures are unremarkable. IMPRESSION: Negative.  No active disease. Electronically Signed   By: Danae Orleans M.D.   On: 10/14/2018 20:37    Pending Labs Unresulted Labs (From admission, onward)    Start     Ordered   10/14/18 2253  ABO/Rh  Once,   STAT     10/14/18 2256   10/14/18 1905  Lactic acid, plasma  Now then every 2 hours,   STAT     10/14/18 1904   10/14/18 1905  Blood culture (routine x 2)  BLOOD CULTURE X 2,   STAT     10/14/18 1904          Vitals/Pain Today's Vitals   10/14/18 2300 10/14/18 2330 10/15/18 0000 10/15/18 0030  BP: 118/84 107/75 110/65 123/84  Pulse: 90 86 77 85  Resp: 16 16 16 17   Temp:      TempSrc:      SpO2: 98% 97% 98% 99%  Weight:      Height:      PainSc:        Isolation Precautions Airborne and Contact precautions  Medications Medications  acetaminophen (TYLENOL) tablet 650 mg (has no administration in time range)    Or  acetaminophen (TYLENOL) suppository 650 mg (has no administration in time range)  HYDROcodone-acetaminophen (NORCO/VICODIN) 5-325 MG per tablet 1-2 tablet (has no administration in time range)  HYDROmorphone (DILAUDID) injection 1 mg (has no  administration in time range)  ondansetron (ZOFRAN-ODT) disintegrating tablet 4 mg (has no administration in time range)    Or  ondansetron (ZOFRAN) injection 4 mg (has no administration in time range)  dextrose 5 % and 0.45 % NaCl with KCl 20 mEq/L infusion ( Intravenous New Bag/Given 10/15/18 0030)  sodium chloride flush (NS) 0.9 % injection 3 mL (3 mLs Intravenous Given 10/14/18 1853)  sodium chloride 0.9 % bolus 1,000 mL (0 mLs Intravenous Stopped 10/14/18 2134)  acetaminophen (TYLENOL) tablet 650 mg (650 mg Oral Given 10/14/18 1919)  morphine 4 MG/ML injection 4 mg (4 mg Intravenous Given 10/14/18 1923)  iohexol (OMNIPAQUE) 300 MG/ML solution 100 mL (100 mLs Intravenous Contrast Given 10/14/18 2037)  ciprofloxacin (CIPRO) IVPB 400 mg (0 mg Intravenous Stopped 10/15/18 0001)  metroNIDAZOLE (FLAGYL) IVPB 500 mg (0 mg Intravenous Stopped 10/15/18 0001)    Mobility walks

## 2018-10-15 NOTE — Consult Note (Signed)
[facility] Initial Consultation Note   Patient Name: Kyle Silva    JSH:[id number] PCP: Patient, No Pcp Per     DOB: [birth year]  DOA: 10/14/2018 DOS: the patient was seen and examined on 10/15/2018   Referring physician: General surgery service Reason for consult: Positive COVID-19 test  HPI: Kyle Silva is a 38 y.o. male with Past medical history of active smoker, recent appendicitis S/P appendectomy, GERD, BMI more than 30. Patient is coming from Home Patient presented with complaints of fever and chills and abdominal pain. Recently presented in the hospital on September 18, 2018 for abdominal pain.  CT abdomen was showing evidence of acute appendicitis. Patient underwent laparoscopic appendectomy. Postoperatively patient was doing well.  Discharged on 09/22/2018 on oral Cipro and Flagyl. Patient continued to have some mild abdominal pain. On a follow-up appointment it was decided that the patient will require CT abdomen. Patient had fever with T-max of 102.  Fever started on 10/14/2018. Patient also had some chills started on 10/12/2018. Denies having any complaints of chest pain, shortness of breath, cough, dizziness, lightheadedness, headache. Also denies any complaints of diarrhea or constipation. No leg swellings no leg pain. No COVID exposure. No nausea no vomiting.  Review of Systems: as mentioned in the history of present illness.  All other systems reviewed and are negative.  Past Medical History:  Diagnosis Date   Acute appendicitis 09/18/2018   Acute diverticulitis 08/30/2015   CAD (coronary artery disease)    Mild with 40% LAD stenosis by cath in 2016   Chest pain    Current smoker 07/13/2014   Diverticulitis    Epididymitis    Family history of adverse reaction to anesthesia    "dad had allergic reaction to anesthesia"    GERD (gastroesophageal reflux disease)    Obese 07/13/2014   Ruptured appendicitis 09/18/2018   Past Surgical History:  Procedure  Laterality Date   CARDIAC CATHETERIZATION N/A 07/13/2014   Procedure: Left Heart Cath and Coronary Angiography;  Surgeon: Marykay Lex, MD;  Location: [facility] INVASIVE CV LAB;  Service: Cardiovascular;  Laterality: N/A;   CYSTECTOMY Left ~ 2014   "wrist"   CYSTECTOMY Left ~ 2014   "2 on my foot""   LAPAROSCOPIC APPENDECTOMY N/A 09/18/2018   Procedure: APPENDECTOMY LAPAROSCOPIC;  Surgeon: Luretha Murphy, MD;  Location: [facility] ORS;  Service: General;  Laterality: N/A;   Social History:  reports that he has quit smoking. His smoking use included cigarettes. He smoked 0.50 packs per day for 0.00 years. He has never used smokeless tobacco. He reports that he does not drink alcohol or use drugs.  Allergies  Allergen Reactions   Penicillins Anaphylaxis    Has patient had a PCN reaction causing immediate rash, facial/tongue/throat swelling, SOB or lightheadedness with hypotension:Yes Has patient had a PCN reaction causing severe rash involving mucus membranes or skin necrosis:unsure Has patient had a PCN reaction that required hospitalization:Yes Has patient had a PCN reaction occurring within the last 10 years:Yes If all of the above answers are "NO", then may proceed with Cephalosporin use.      Family History  Problem Relation Age of Onset   AAA (abdominal aortic aneurysm) Mother    Hypertension Mother    Diabetes Mother    Heart failure Father    Stroke Father     Prior to Admission medications   Medication Sig Start Date End Date Taking? Authorizing Provider  aspirin EC 81 MG tablet Take 81 mg by mouth daily.  Yes [provider]  atorvastatin (LIPITOR) 40 MG tablet Take 1 tablet (40 mg total) by mouth daily. 10/02/18  Yes Daune Perch, NP  nicotine (NICODERM CQ - DOSED IN MG/24 HR) 7 mg/24hr patch Place 7 mg onto the skin every 14 (fourteen) days. 10/07/18  Yes [provider]  pantoprazole (PROTONIX) 40 MG tablet Take 1 tablet (40 mg total) by mouth daily.  10/02/18  Yes Daune Perch, NP  polyethylene glycol (MIRALAX / GLYCOLAX) 17 g packet Take 17 g by mouth daily as needed for mild constipation. 09/22/18  Yes Focht, Jessica L, PA  docusate sodium (COLACE) 100 MG capsule Take 1 capsule (100 mg total) by mouth 2 (two) times daily. Patient not taking: Reported on 10/14/2018 09/22/18   Kalman Drape, PA  HYDROcodone-acetaminophen (NORCO/VICODIN) 5-325 MG tablet Take 1 tablet by mouth every 6 (six) hours as needed for moderate pain. Patient not taking: Reported on 10/14/2018 09/22/18   Kalman Drape, [place]    Physical Exam: Vitals:   10/15/18 0030 10/15/18 0100 10/15/18 0137 10/15/18 0539  BP: 123/84 118/81 128/78 120/86  Pulse: 85 85 88 74  Resp: 17 16 16 16   Temp:   98.8 F (37.1 C) 98.6 F (37 C)  TempSrc:   Oral Oral  SpO2: 99% 94% 100% 98%  Weight:      Height:       General: alert and oriented to time, place, and person. Appear in mild distress, affect appropriate Eyes: PERRL, Conjunctiva normal ENT: Oral Mucosa Clear, moist  Neck: no JVD, no Abnormal Mass Or lumps Cardiovascular: S1 and S2 Present, no Murmur, peripheral pulses symmetrical Respiratory: normal respiratory effort, Bilateral Air entry equal and Decreased, no use of accessory muscle, Clear to Auscultation, no Crackles, no wheezes Abdomen: Bowel Sound present, Soft and no tenderness, no hernia Skin: no rashes  Extremities: no Pedal edema, no calf tenderness Neurologic: without any new focal findings Gait not checked due to patient safety concerns   Labs:  CBC: Recent Labs  Lab 10/14/18 1826 10/15/18 0744  WBC 11.8* 9.8  HGB 14.4 12.9*  HCT 43.1 39.7  MCV 90.9 92.1  PLT 240 778   Basic Metabolic Panel: Recent Labs  Lab 10/14/18 1826  NA 136  K 5.0  CL 102  CO2 25  GLUCOSE 77  BUN 10  CREATININE 0.82  CALCIUM 8.9   Liver Function Tests: Recent Labs  Lab 10/14/18 1826  AST 35  ALT 43  ALKPHOS 71  BILITOT 0.9  PROT 8.0  ALBUMIN 3.8    Recent Labs  Lab 10/14/18 1826  LIPASE 33   No results for input(s): AMMONIA in the last 168 hours.  Cardiac Enzymes: No results for input(s): CKTOTAL, CKMB, CKMBINDEX, TROPONINI in the last 168 hours. No results for input(s): PROBNP in the last 8760 hours.  CBG: No results for input(s): GLUCAP in the last 168 hours.  Radiological Exams: Ct Abdomen Pelvis W Contrast  Result Date: 10/14/2018 CLINICAL DATA:  Abdominal pain, distention, and fever. 3 weeks status post appendectomy. EXAM: CT ABDOMEN AND PELVIS WITH CONTRAST TECHNIQUE: Multidetector CT imaging of the abdomen and pelvis was performed using the standard protocol following bolus administration of intravenous contrast. CONTRAST:  187mL OMNIPAQUE IOHEXOL 300 MG/ML  SOLN COMPARISON:  09/18/2018 FINDINGS: Lower Chest: No acute findings. Hepatobiliary: No hepatic masses identified. Mild hepatic steatosis. Gallbladder is unremarkable. No evidence of biliary ductal dilatation. Pancreas:  No mass or inflammatory changes. Spleen: Within normal limits in size and  appearance. Adrenals/Urinary Tract: No masses identified. No evidence of hydronephrosis. Stomach/Bowel: Postop changes from appendectomy noted. A rim enhancing fluid collection is seen within the right lower quadrant mesentery which measures 4.0 x 3.2 cm, consistent with abscess. Surrounding inflammatory changes are seen. Mild dilatation and wall thickening is seen involving adjacent distal small bowel, consistent with focal ileus. No evidence of bowel obstruction. Right colonic diverticulosis again seen, however there is no evidence of diverticulitis. Vascular/Lymphatic: No pathologically enlarged lymph nodes. No abdominal aortic aneurysm. Reproductive:  No mass or other significant abnormality. Other:  None. Musculoskeletal:  No suspicious bone lesions identified. IMPRESSION: 4.0 cm abscess in right lower quadrant mesentery, with surrounding inflammatory changes. Focal ileus involving  right lower quadrant distal small bowel loops. Right colonic diverticulosis, without radiographic evidence of diverticulitis. Mild hepatic steatosis. Electronically Signed   By: Danae Orleans M.D.   On: 10/14/2018 20:55   Dg Chest Port 1 View  Result Date: 10/14/2018 CLINICAL DATA:  Fever. EXAM: PORTABLE CHEST 1 VIEW COMPARISON:  08/13/2015 FINDINGS: The heart size and mediastinal contours are within normal limits. Both lungs are clear. The visualized skeletal structures are unremarkable. IMPRESSION: Negative.  No active disease. Electronically Signed   By: Danae Orleans M.D.   On: 10/14/2018 20:37   Assessment/Plan 1. Acute COVID-19 Viral illness Currently maintained at [facility].  Will not transfer the [facility] for now.  Lab Results  Component Value Date   SARSCOV2NAA POSITIVE (A) 10/14/2018   SARSCOV2NAA NEGATIVE 09/18/2018   CXR: No acute abnormality CT abdomen, lower chest: No evidence of GGO, consolidation, normal CT scan T-max 102 Oxygen requirements: Currently on room air Antibiotics: Currently on IV Cipro and Flagyl for intra-abdominal abscess Diuretics: Not indicated Vitamin C and Zinc: Started on 10/15/2018 DVT Prophylaxis: Subcutaneous Lovenox recommended if general surgery is agreeable.  Remdesivir: Not indicated for now.  Currently not hypoxic currently no infiltrate on the CT scan or x-ray.  Steroids: Not indicated for now.  Monitor while the patient is actually dealing with intra-abdominal abscess as well as re-postop recovery.  Prone positioning: Patient encouraged to stay in prone position as much as possible.  PPE During this encounter: Patient Isolation: Airborne + Droplet + Contact HCP PPE: CAPR, gown. gloves Patient PPE: None  The treatment plan and use of medications and known side effects were discussed with patient/family. It was clearly explained that there is no proven definitive treatment for COVID-19 infection yet. Any medications used here are based  on case reports/anecdotal data which are not peer-reviewed and has not been studied using randomized control trials.  Complete risks and long-term side effects are unknown, however in the best clinical judgment they seem to be of some clinical benefit rather than medical risks.  Patient/family agree with the treatment plan and want to receive these treatments as indicated.   2.  Intra-abdominal abscess. Recent lap appendectomy Patient started on IV antibiotics per surgery. Has penicillin allergy. IR mentions that they will not be able to drain the abscess and recommend reimaging in a few days. Management per surgery for now. Given that the patient will require ongoing surgical evaluation recommend Not to Transfer Patient to [facility] for Now.  3. Body mass index is 37.16 kg/m.  Elevated BMI. Outpatient follow-up with PCP for dietary assistance.  4.  CAD. Mild 40% mid LAD stenosis in 726. Chronic T wave changes. Currently no anginal symptoms. Patient already follows up with cardiology outpatient.  5.  Active smoker. Smokes 1/3 pack a day.  Requests nicotine patch for now which we will provide. Mentions that he quit smoking for now. Will require nicotine patch prescription on discharge.  6.  Hyperlipidemia. Supposed to be on Lipitor 40 mg daily. Currently on hold. Resume on discharge.  Family Communication:  No family was present at bedside, at the time of interview.  Primary team communication: Informed primary team.  Thank you very much for involving us in care of your patient.  We will continue to follow the patient.   Author: Lynden OxfordPranav Asheton Viramontes, MD [facility] 10/15/2018 11:04 AM    If 7PM-7AM, please contact night-coverage [email or web address]

## 2018-10-15 NOTE — Plan of Care (Signed)
  Problem: Education: Goal: Knowledge of General Education information will improve Description: Including pain rating scale, medication(s)/side effects and non-pharmacologic comfort measures Outcome: Progressing   Problem: Health Behavior/Discharge Planning: Goal: Ability to manage health-related needs will improve Outcome: Progressing   Problem: Clinical Measurements: Goal: Ability to maintain clinical measurements within normal limits will improve Outcome: Progressing Goal: Will remain free from infection Outcome: Progressing Note: IV abx, for percutaneous abcess drainage in IR today.  Goal: Diagnostic test results will improve Outcome: Progressing Goal: Respiratory complications will improve Outcome: Progressing Note: Pt COVID + w/ no resp sx.    Problem: Activity: Goal: Risk for activity intolerance will decrease Outcome: Progressing   Problem: Pain Managment: Goal: General experience of comfort will improve Outcome: Progressing Note: IV dilaudid for rlq pain.    Problem: Safety: Goal: Ability to remain free from injury will improve Outcome: Progressing

## 2018-10-15 NOTE — Progress Notes (Addendum)
Subjective: CC: Patient had a fever of 102.8 on arrival to the ED yesterday. He reports that his pain continues to be a constant, moderate-severe pain located in his RLQ. He denies any n/v since admission.   Patients covid test was noted to be positive. It was negative during his last admission on 9/10. He reports post op he has been mainly at home other than going to the grocery store and for follow up in the office. He has not been back to work. He denies contact with anyone with COVID. He denies any cough, sob, chest pain, loss of taste or smell etc.   Objective: Vital signs in last 24 hours: Temp:  [98 F (36.7 C)-102.8 F (39.3 C)] 98.6 F (37 C) (10/07 0539) Pulse Rate:  [74-117] 74 (10/07 0539) Resp:  [16-20] 16 (10/07 0539) BP: (107-131)/(65-86) 120/86 (10/07 0539) SpO2:  [92 %-100 %] 98 % (10/07 0539) Weight:  [117.5 kg] 117.5 kg (10/06 1822) Last BM Date: 10/14/18  Intake/Output from previous day: 10/06 0701 - 10/07 0700 In: 1000 [IV Piggyback:1000] Out: -  Intake/Output this shift: No intake/output data recorded.  PE: Gen:  Alert, NAD, pleasant HEENT: EOM's intact, pupils equal and round Card:  RRR, no M/G/R heard Pulm:  CTAB, no W/R/R, effort normal Abd: Soft, ND, tenderness in the RLQ with some voluntary guarding, no involuntary guarding, rebound or rigidity. No peritonitis. Incisions with glue intact appears well and are without drainage, bleeding, or signs of infection. Prior drain site c/d/i. +BS Ext:  No LE edema Psych: A&Ox3  Skin: no rashes noted, warm and dry  Lab Results:  Recent Labs    10/14/18 1826 10/15/18 0744  WBC 11.8* 9.8  HGB 14.4 12.9*  HCT 43.1 39.7  PLT 240 231   BMET Recent Labs    10/14/18 1826  NA 136  K 5.0  CL 102  CO2 25  GLUCOSE 77  BUN 10  CREATININE 0.82  CALCIUM 8.9   PT/INR No results for input(s): LABPROT, INR in the last 72 hours. CMP     Component Value Date/Time   NA 136 10/14/2018 1826   K 5.0  10/14/2018 1826   CL 102 10/14/2018 1826   CO2 25 10/14/2018 1826   GLUCOSE 77 10/14/2018 1826   BUN 10 10/14/2018 1826   CREATININE 0.82 10/14/2018 1826   CALCIUM 8.9 10/14/2018 1826   PROT 8.0 10/14/2018 1826   ALBUMIN 3.8 10/14/2018 1826   AST 35 10/14/2018 1826   ALT 43 10/14/2018 1826   ALKPHOS 71 10/14/2018 1826   BILITOT 0.9 10/14/2018 1826   GFRNONAA >60 10/14/2018 1826   GFRAA >60 10/14/2018 1826   Lipase     Component Value Date/Time   LIPASE 33 10/14/2018 1826       Studies/Results: Ct Abdomen Pelvis W Contrast  Result Date: 10/14/2018 CLINICAL DATA:  Abdominal pain, distention, and fever. 3 weeks status post appendectomy. EXAM: CT ABDOMEN AND PELVIS WITH CONTRAST TECHNIQUE: Multidetector CT imaging of the abdomen and pelvis was performed using the standard protocol following bolus administration of intravenous contrast. CONTRAST:  OMNIPAQUE IOHEXOL 300 MG/ML  SOLN COMPARISON:  09/18/2018 FINDINGS: Lower Chest: No acute findings. Hepatobiliary: No hepatic masses identified. Mild hepatic steatosis. Gallbladder is unremarkable. No evidence of biliary ductal dilatation. Pancreas:  No mass or inflammatory changes. Spleen: Within normal limits in size and appearance. Adrenals/Urinary Tract: No masses identified. No evidence of hydronephrosis. Stomach/Bowel: Postop changes from appendectomy noted. A  rim enhancing fluid collection is seen within the right lower quadrant mesentery which measures 4.0 x 3.2 cm, consistent with abscess. Surrounding inflammatory changes are seen. Mild dilatation and wall thickening is seen involving adjacent distal small bowel, consistent with focal ileus. No evidence of bowel obstruction. Right colonic diverticulosis again seen, however there is no evidence of diverticulitis. Vascular/Lymphatic: No pathologically enlarged lymph nodes. No abdominal aortic aneurysm. Reproductive:  No mass or other significant abnormality. Other:  None.  Musculoskeletal:  No suspicious bone lesions identified. IMPRESSION: 4.0 cm abscess in right lower quadrant mesentery, with surrounding inflammatory changes. Focal ileus involving right lower quadrant distal small bowel loops. Right colonic diverticulosis, without radiographic evidence of diverticulitis. Mild hepatic steatosis. Electronically Signed   By: Marlaine Hind M.D.   On: 10/14/2018 20:55   Dg Chest Port 1 View  Result Date: 10/14/2018 CLINICAL DATA:  Fever. EXAM: PORTABLE CHEST 1 VIEW COMPARISON:  08/13/2015 FINDINGS: The heart size and mediastinal contours are within normal limits. Both lungs are clear. The visualized skeletal structures are unremarkable. IMPRESSION: Negative.  No active disease. Electronically Signed   By: Marlaine Hind M.D.   On: 10/14/2018 20:37    Anti-infectives: Anti-infectives (From admission, onward)   Start     Dose/Rate Route Frequency Ordered Stop   10/15/18 1000  ciprofloxacin (CIPRO) IVPB 400 mg     400 mg 200 mL/hr over 60 Minutes Intravenous Every 12 hours 10/15/18 0712     10/15/18 0800  metroNIDAZOLE (FLAGYL) IVPB 500 mg     500 mg 100 mL/hr over 60 Minutes Intravenous Every 8 hours 10/15/18 0712     10/14/18 2115  ciprofloxacin (CIPRO) IVPB 400 mg     400 mg 200 mL/hr over 60 Minutes Intravenous  Once 10/14/18 2106 10/15/18 0001   10/14/18 2115  metroNIDAZOLE (FLAGYL) IVPB 500 mg     500 mg 100 mL/hr over 60 Minutes Intravenous  Once 10/14/18 2106 10/15/18 0001       Assessment/Plan COVID + - asymptomatic. Will consult hospitalist. Appreciate their assistance.   Hx of Laparoscopic Appendectomy by Dr. Hassell Done on 9/10 for perforated appendicitis with abscess  RLQ intra-abdominal abscess - Drain removed in office on 9/18 - CT yesterday with 4cm abscess in the RLQ  - IR unable to drain at this time.  - WBC normalized (11.8>9.8) - Patient will need 24-48 hours of IV abx before transitioning to oral abx for a total of 10-14 days. Will discuss  with hospitalist if patient should stay at [facility] or be moved to [place].  - Will start back on a diet.   FEN - FLD VTE - SCDs, Lovenox ID - Cipro/Flagyl   LOS: 0 days    Jillyn Ledger , [facility] 10/15/2018, 9:48 AM Pager: [phone]

## 2018-10-15 NOTE — Progress Notes (Signed)
Patient ID: Kyle Silva, male   DOB: 02-23-1980, 37 y.o.   MRN: [id number] Request received for possible percutaneous drainage of right lower quadrant abscess in pt. Imaging studies were reviewed by Dr. Laurence Ferrari and abscess is not amenable to perc drainage at this time.  He recommends continued antibiotic therapy and reimaging in 3 to 5 days. Please contact [facility] at [phone] with any questions.

## 2018-10-16 ENCOUNTER — Telehealth: Payer: Self-pay

## 2018-10-16 DIAGNOSIS — F17200 Nicotine dependence, unspecified, uncomplicated: Secondary | ICD-10-CM

## 2018-10-16 DIAGNOSIS — E669 Obesity, unspecified: Secondary | ICD-10-CM

## 2018-10-16 DIAGNOSIS — D649 Anemia, unspecified: Secondary | ICD-10-CM

## 2018-10-16 DIAGNOSIS — U071 COVID-19: Secondary | ICD-10-CM

## 2018-10-16 DIAGNOSIS — K651 Peritoneal abscess: Secondary | ICD-10-CM

## 2018-10-16 LAB — CBC WITH DIFFERENTIAL/PLATELET
Abs Immature Granulocytes: 0.03 10*3/uL (ref 0.00–0.07)
Basophils Absolute: 0 10*3/uL (ref 0.0–0.1)
Basophils Relative: 0 %
Eosinophils Absolute: 0.1 10*3/uL (ref 0.0–0.5)
Eosinophils Relative: 1 %
HCT: 39 % (ref 39.0–52.0)
Hemoglobin: 12.8 g/dL — ABNORMAL LOW (ref 13.0–17.0)
Immature Granulocytes: 0 %
Lymphocytes Relative: 13 %
Lymphs Abs: 1.3 10*3/uL (ref 0.7–4.0)
MCH: 29.8 pg (ref 26.0–34.0)
MCHC: 32.8 g/dL (ref 30.0–36.0)
MCV: 90.7 fL (ref 80.0–100.0)
Monocytes Absolute: 1.3 10*3/uL — ABNORMAL HIGH (ref 0.1–1.0)
Monocytes Relative: 13 %
Neutro Abs: 6.8 10*3/uL (ref 1.7–7.7)
Neutrophils Relative %: 73 %
Platelets: 233 10*3/uL (ref 150–400)
RBC: 4.3 MIL/uL (ref 4.22–5.81)
RDW: 12.9 % (ref 11.5–15.5)
WBC: 9.4 10*3/uL (ref 4.0–10.5)
nRBC: 0 % (ref 0.0–0.2)

## 2018-10-16 LAB — COMPREHENSIVE METABOLIC PANEL
ALT: 32 U/L (ref 0–44)
AST: 17 U/L (ref 15–41)
Albumin: 3.4 g/dL — ABNORMAL LOW (ref 3.5–5.0)
Alkaline Phosphatase: 59 U/L (ref 38–126)
Anion gap: 12 (ref 5–15)
BUN: 6 mg/dL (ref 6–20)
CO2: 25 mmol/L (ref 22–32)
Calcium: 8.7 mg/dL — ABNORMAL LOW (ref 8.9–10.3)
Chloride: 99 mmol/L (ref 98–111)
Creatinine, Ser: 0.81 mg/dL (ref 0.61–1.24)
GFR calc Af Amer: >60 mL/min (ref >60)
GFR calc non Af Amer: >60 mL/min (ref >60)
Glucose, Bld: 119 mg/dL — ABNORMAL HIGH (ref 70–99)
Potassium: 4 mmol/L (ref 3.5–5.1)
Sodium: 136 mmol/L (ref 135–145)
Total Bilirubin: 1.2 mg/dL (ref 0.3–1.2)
Total Protein: 7.3 g/dL (ref 6.5–8.1)

## 2018-10-16 LAB — C-REACTIVE PROTEIN: CRP: 20.7 mg/dL — ABNORMAL HIGH (ref <1.0)

## 2018-10-16 LAB — D-DIMER, QUANTITATIVE: D-Dimer, Quant: 2.73 ug/mL-FEU — ABNORMAL HIGH (ref 0.00–0.50)

## 2018-10-16 MED ORDER — CIPROFLOXACIN HCL 500 MG PO TABS: 500.0000 mg | ORAL_TABLET | Freq: Two times a day (BID) | ORAL | 0 refills | Status: AC

## 2018-10-16 MED ORDER — TRAMADOL HCL 50 MG PO TABS: 50.0000 mg | ORAL_TABLET | Freq: Four times a day (QID) | ORAL | Status: DC | PRN

## 2018-10-16 MED ORDER — OXYCODONE HCL 5 MG PO TABS
5.0000 mg | ORAL_TABLET | Freq: Four times a day (QID) | ORAL | Status: DC | PRN
  Administered 2018-10-16: 5 mg via ORAL
  Filled 2018-10-16: qty 2

## 2018-10-16 MED ORDER — HYDROMORPHONE HCL 1 MG/ML IJ SOLN: 1.0000 mg | INTRAMUSCULAR | Status: DC | PRN

## 2018-10-16 MED ORDER — ACETAMINOPHEN 500 MG PO TABS
1000.0000 mg | ORAL_TABLET | Freq: Four times a day (QID) | ORAL | Status: DC
  Administered 2018-10-16 (×2): 1000 mg via ORAL
  Filled 2018-10-16 (×2): qty 2

## 2018-10-16 MED ORDER — OXYCODONE HCL 5 MG PO TABS: 5.0000 mg | ORAL_TABLET | Freq: Four times a day (QID) | ORAL | 0 refills | Status: DC | PRN

## 2018-10-16 MED ORDER — METRONIDAZOLE 500 MG PO TABS: 500.0000 mg | ORAL_TABLET | Freq: Three times a day (TID) | ORAL | 0 refills | Status: AC

## 2018-10-16 MED ORDER — NICOTINE 14 MG/24HR TD PT24: 14.0000 mg | MEDICATED_PATCH | Freq: Every day | TRANSDERMAL | 0 refills | Status: DC

## 2018-10-16 MED ORDER — METHOCARBAMOL 500 MG PO TABS
500.0000 mg | ORAL_TABLET | Freq: Four times a day (QID) | ORAL | Status: DC
  Administered 2018-10-16 (×2): 500 mg via ORAL
  Filled 2018-10-16 (×2): qty 1

## 2018-10-16 NOTE — Discharge Instructions (Signed)
Please quarantine for the next 2 weeks.   Please take all of your antibiotics until finished!   You may develop abdominal discomfort or diarrhea from the antibiotic.  You may help offset this with probiotics which you can buy or get in yogurt. Do not eat or take the probiotics until 2 hours after your antibiotic. Do not take your medicine if develop an itchy rash, swelling in your mouth or lips, or difficulty breathing.   If you develop ongoing fever, worsening abdominal pain, inability to tolerate a diet, nausea or emesis please call the office or go to the ER for evaluation       Managing Your Pain After Surgery Without Opioids    Thank you for participating in our program to help patients manage their pain after surgery without opioids. This is part of our effort to provide you with the best care possible, without exposing you or your family to the risk that opioids pose.  What pain can I expect after surgery? You can expect to have some pain after surgery. This is normal. The pain is typically worse the day after surgery, and quickly begins to get better. Many studies have found that many patients are able to manage their pain after surgery with Over-the-Counter (OTC) medications such as Tylenol and Motrin. If you have a condition that does not allow you to take Tylenol or Motrin, notify your surgical team.  How will I manage my pain? The best strategy for controlling your pain after surgery is around the clock pain control with Tylenol (acetaminophen) and Motrin (ibuprofen or Advil). Alternating these medications with each other allows you to maximize your pain control. In addition to Tylenol and Motrin, you can use heating pads or ice packs on your incisions to help reduce your pain.  How will I alternate your regular strength over-the-counter pain medication? You will take a dose of pain medication every three hours. ; Start by taking 650 mg of Tylenol (2 pills of 325  mg) ; 3 hours later take 600 mg of Motrin (3 pills of 200 mg) ; 3 hours after taking the Motrin take 650 mg of Tylenol ; 3 hours after that take 600 mg of Motrin.   - 1 -  See example - if your first dose of Tylenol is at 12:00 PM   12:00 PM Tylenol 650 mg (2 pills of 325 mg)  3:00 PM Motrin 600 mg (3 pills of 200 mg)  6:00 PM Tylenol 650 mg (2 pills of 325 mg)  9:00 PM Motrin 600 mg (3 pills of 200 mg)  Continue alternating every 3 hours   We recommend that you follow this schedule around-the-clock for at least 3 days after surgery, or until you feel that it is no longer needed. Use the table on the last page of this handout to keep track of the medications you are taking. Important: Do not take more than  of Tylenol or  of Motrin in a 24-hour period. Do not take ibuprofen/Motrin if you have a history of bleeding stomach ulcers, severe kidney disease, &/or actively taking a blood thinner  What if I still have pain? If you have pain that is not controlled with the over-the-counter pain medications (Tylenol and Motrin or Advil) you might have what we call breakthrough pain. You will receive a prescription for a small amount of an opioid pain medication such as Oxycodone, Tramadol, or Tylenol with Codeine. Use these opioid pills in the first 24 hours after  surgery if you have breakthrough pain. Do not take more than 1 pill every 4-6 hours.  If you still have uncontrolled pain after using all opioid pills, don't hesitate to call our staff using the number provided. We will help make sure you are managing your pain in the best way possible, and if necessary, we can provide a prescription for additional pain medication.   Day 1    Time  Name of Medication Number of pills taken  Amount of Acetaminophen  Pain Level   Comments  AM PM       AM PM       AM PM       AM PM       AM PM       AM PM       AM PM       AM PM       Total Daily amount of Acetaminophen Do not  take more than  3,000 mg per day      Day 2    Time  Name of Medication Number of pills taken  Amount of Acetaminophen  Pain Level   Comments  AM PM       AM PM       AM PM       AM PM       AM PM       AM PM       AM PM       AM PM       Total Daily amount of Acetaminophen Do not take more than  3,000 mg per day      Day 3    Time  Name of Medication Number of pills taken  Amount of Acetaminophen  Pain Level   Comments  AM PM       AM PM       AM PM       AM PM          AM PM       AM PM       AM PM       AM PM       Total Daily amount of Acetaminophen Do not take more than  3,000 mg per day      Day 4    Time  Name of Medication Number of pills taken  Amount of Acetaminophen  Pain Level   Comments  AM PM       AM PM       AM PM       AM PM       AM PM       AM PM       AM PM       AM PM       Total Daily amount of Acetaminophen Do not take more than  3,000 mg per day      Day 5    Time  Name of Medication Number of pills taken  Amount of Acetaminophen  Pain Level   Comments  AM PM       AM PM       AM PM       AM PM       AM PM       AM PM       AM PM       AM PM       Total Daily amount of  Acetaminophen Do not take more than  3,000 mg per day       Day 6    Time  Name of Medication Number of pills taken  Amount of Acetaminophen  Pain Level  Comments  AM PM       AM PM       AM PM       AM PM       AM PM       AM PM       AM PM       AM PM       Total Daily amount of Acetaminophen Do not take more than  3,000 mg per day      Day 7    Time  Name of Medication Number of pills taken  Amount of Acetaminophen  Pain Level   Comments  AM PM       AM PM       AM PM       AM PM       AM PM       AM PM       AM PM       AM PM       Total Daily amount of Acetaminophen Do not take more than  3,000 mg per day        For additional information about how and where to safely dispose of unused  opioid medications - PrankCrew.uyhttps://www.morepowerfulnc.org  Disclaimer: This document contains information and/or instructional materials adapted from [facility] for the typical patient with your condition. It does not replace medical advice from your health care provider because your experience may differ from that of the typical patient. Talk to your health care provider if you have any questions about this document, your condition or your treatment plan. Adapted from [facility]   COVID-19 COVID-19 is a respiratory infection that is caused by a virus called severe acute respiratory syndrome coronavirus 2 (SARS-CoV-2). The disease is also known as coronavirus disease or novel coronavirus. In some people, the virus may not cause any symptoms. In others, it may cause a serious infection. The infection can get worse quickly and can lead to complications, such as:  Pneumonia, or infection of the lungs.  Acute respiratory distress syndrome or ARDS. This is fluid build-up in the lungs.  Acute respiratory failure. This is a condition in which there is not enough oxygen passing from the lungs to the body.  Sepsis or septic shock. This is a serious bodily reaction to an infection.  Blood clotting problems.  Secondary infections due to bacteria or fungus. The virus that causes COVID-19 is contagious. This means that it can spread from person to person through droplets from coughs and sneezes (respiratory secretions). What are the causes? This illness is caused by a virus. You may catch the virus by:  Breathing in droplets from an infected person's cough or sneeze.  Touching something, like a table or a doorknob, that was exposed to the virus (contaminated) and then touching your mouth, nose, or eyes. What increases the risk? Risk for infection You are more likely to be infected with this virus if you:  Live in or travel to an area with a COVID-19 outbreak.  Come in contact with a  sick person who recently traveled to an area with a COVID-19 outbreak.  Provide care for or live with a person who is infected with COVID-19. Risk for serious illness You are more  likely to become seriously ill from the virus if you:  Are 50 years of age or older.  Have a long-term disease that lowers your body's ability to fight infection (immunocompromised).  Live in a nursing home or long-term care facility.  Have a long-term (chronic) disease such as: ? Chronic lung disease, including chronic obstructive pulmonary disease or asthma ? Heart disease. ? Diabetes. ? Chronic kidney disease. ? Liver disease.  Are obese. What are the signs or symptoms? Symptoms of this condition can range from mild to severe. Symptoms may appear any time from 2 to 14 days after being exposed to the virus. They include:  A fever.  A cough.  Difficulty breathing.  Chills.  Muscle pains.  A sore throat.  Loss of taste or smell. Some people may also have stomach problems, such as nausea, vomiting, or diarrhea. Other people may not have any symptoms of COVID-19. How is this diagnosed? This condition may be diagnosed based on:  Your signs and symptoms, especially if: ? You live in an area with a COVID-19 outbreak. ? You recently traveled to or from an area where the virus is common. ? You provide care for or live with a person who was diagnosed with COVID-19.  A physical exam.  Lab tests, which may include: ? A nasal swab to take a sample of fluid from your nose. ? A throat swab to take a sample of fluid from your throat. ? A sample of mucus from your lungs (sputum). ? Blood tests.  Imaging tests, which may include, X-rays, CT scan, or ultrasound. How is this treated? At present, there is no medicine to treat COVID-19. Medicines that treat other diseases are being used on a trial basis to see if they are effective against COVID-19. Your health care provider will talk with you about  ways to treat your symptoms. For most people, the infection is mild and can be managed at home with rest, fluids, and over-the-counter medicines. Treatment for a serious infection usually takes places in a hospital intensive care unit (ICU). It may include one or more of the following treatments. These treatments are given until your symptoms improve.  Receiving fluids and medicines through an IV.  Supplemental oxygen. Extra oxygen is given through a tube in the nose, a face mask, or a hood.  Positioning you to lie on your stomach (prone position). This makes it easier for oxygen to get into the lungs.  Continuous positive airway pressure (CPAP) or bi-level positive airway pressure (BPAP) machine. This treatment uses mild air pressure to keep the airways open. A tube that is connected to a motor delivers oxygen to the body.  Ventilator. This treatment moves air into and out of the lungs by using a tube that is placed in your windpipe.  Tracheostomy. This is a procedure to create a hole in the neck so that a breathing tube can be inserted.  Extracorporeal membrane oxygenation (ECMO). This procedure gives the lungs a chance to recover by taking over the functions of the heart and lungs. It supplies oxygen to the body and removes carbon dioxide. Follow these instructions at home: Lifestyle  If you are sick, stay home except to get medical care. Your health care provider will tell you how long to stay home. Call your health care provider before you go for medical care.  Rest at home as told by your health care provider.  Do not use any products that contain nicotine or tobacco, such as cigarettes,  e-cigarettes, and chewing tobacco. If you need help quitting, ask your health care provider.  Return to your normal activities as told by your health care provider. Ask your health care provider what activities are safe for you. General instructions  Take over-the-counter and prescription medicines  only as told by your health care provider.  Drink enough fluid to keep your urine pale yellow.  Keep all follow-up visits as told by your health care provider. This is important. How is this prevented?  There is no vaccine to help prevent COVID-19 infection. However, there are steps you can take to protect yourself and others from this virus. To protect yourself:   Do not travel to areas where COVID-19 is a risk. The areas where COVID-19 is reported change often. To identify high-risk areas and travel restrictions, check the CDC travel website: FatFares.com.br  If you live in, or must travel to, an area where COVID-19 is a risk, take precautions to avoid infection. ? Stay away from people who are sick. ? Wash your hands often with soap and water for 20 seconds. If soap and water are not available, use an alcohol-based hand sanitizer. ? Avoid touching your mouth, face, eyes, or nose. ? Avoid going out in public, follow guidance from your state and local health authorities. ? If you must go out in public, wear a cloth face covering or face mask. ? Disinfect objects and surfaces that are frequently touched every day. This may include:  Counters and tables.  Doorknobs and light switches.  Sinks and faucets.  Electronics, such as phones, remote controls, keyboards, computers, and tablets. To protect others: If you have symptoms of COVID-19, take steps to prevent the virus from spreading to others.  If you think you have a COVID-19 infection, contact your health care provider right away. Tell your health care team that you think you may have a COVID-19 infection.  Stay home. Leave your house only to seek medical care. Do not use public transport.  Do not travel while you are sick.  Wash your hands often with soap and water for 20 seconds. If soap and water are not available, use alcohol-based hand sanitizer.  Stay away from other members of your household. Let healthy  household members care for children and pets, if possible. If you have to care for children or pets, wash your hands often and wear a mask. If possible, stay in your own room, separate from others. Use a different bathroom.  Make sure that all people in your household wash their hands well and often.  Cough or sneeze into a tissue or your sleeve or elbow. Do not cough or sneeze into your hand or into the air.  Wear a cloth face covering or face mask. Where to find more information  Centers for Disease Control and Prevention: PurpleGadgets.be  World Health Organization: https://www.castaneda.info/ Contact a health care provider if:  You live in or have traveled to an area where COVID-19 is a risk and you have symptoms of the infection.  You have had contact with someone who has COVID-19 and you have symptoms of the infection. Go to the emergency department if   You have trouble breathing.  You have pain or pressure in your chest.  You have confusion.  You have bluish lips and fingernails.  You have difficulty waking from sleep.  You have symptoms that get worse. These symptoms may represent a serious problem that is an emergency. Do not wait to see if the symptoms  will go away. Get medical help right away. Call your local emergency services (911 in the [place] Do not drive yourself to the hospital. Let the emergency medical personnel know if you think you have COVID-19. Summary  COVID-19 is a respiratory infection that is caused by a virus. It is also known as coronavirus disease or novel coronavirus. It can cause serious infections, such as pneumonia, acute respiratory distress syndrome, acute respiratory failure, or sepsis.  The virus that causes COVID-19 is contagious. This means that it can spread from person to person through droplets from coughs and sneezes.  You are more likely to develop a serious illness if you are 58 years of age or  older, have a weak immunity, live in a nursing home, or have chronic disease.  There is no medicine to treat COVID-19. Your health care provider will talk with you about ways to treat your symptoms.  Take steps to protect yourself and others from infection. Wash your hands often and disinfect objects and surfaces that are frequently touched every day. Stay away from people who are sick and wear a mask if you are sick. This information is not intended to replace advice given to you by your health care provider. Make sure you discuss any questions you have with your health care provider. Document Released: 01/30/2018 Document Revised: 05/22/2018 Document Reviewed: 01/30/2018 Elsevier Patient Education  2020 [employer].

## 2018-10-16 NOTE — Progress Notes (Signed)
PROGRESS NOTE    Kyle Silva  ZOX:[id number] DOB: June 25, 1980 DOA: 10/14/2018 PCP: Patient, No Pcp Per   Brief Narrative: HPI per Dr. Lynden Oxford on 10/15/2018 Kyle Silva is a 38 y.o. male with Past medical history of active smoker, recent appendicitis S/P appendectomy, GERD, BMI more than 30. Patient is coming from Home Patient presented with complaints of fever and chills and abdominal pain. Recently presented in the hospital on September 18, 2018 for abdominal pain.  CT abdomen was showing evidence of acute appendicitis. Patient underwent laparoscopic appendectomy. Postoperatively patient was doing well.  Discharged on 09/22/2018 on oral Cipro and Flagyl. Patient continued to have some mild abdominal pain. On a follow-up appointment it was decided that the patient will require CT abdomen. Patient had fever with T-max of 102.  Fever started on 10/14/2018. Patient also had some chills started on 10/12/2018. Denies having any complaints of chest pain, shortness of breath, cough, dizziness, lightheadedness, headache. Also denies any complaints of diarrhea or constipation. No leg swellings no leg pain. No COVID exposure. No nausea no vomiting.  **Interim History Spiked a fever yesterday afternoon of 100.9 but is now been afebrile.  He is not hypoxic or complaining of any shortness of breath and not coughing.  Not requiring any supplemental oxygen via nasal cannula and is feeling well.  Inflammatory markers are slightly trending upwards now the patient is asymptomatic.  General surgery is planning on discharging patient later this afternoon and I have asked the case manager to arrange follow-up appointment for the patient for PCP so that he can have inflammatory markers checked in outpatient setting.  Assessment & Plan:   Principal Problem:   COVID-19 virus infection Active Problems:   Current smoker   Intra-abdominal abscess (HCC)   Obesity, Class I, BMI 30.0-34.9 (see actual  BMI)  Acute COVID-19 Viral illness Currently maintained at [facility].  Will not transfer the [facility] for now as he is likely being discharged.  Recent Labs       Lab Results  Component Value Date   SARSCOV2NAA POSITIVE (A) 10/14/2018   SARSCOV2NAA NEGATIVE 09/18/2018     CXR on 10/14/2018: "The heart size and mediastinal contours are within normal limits. Both lungs are clear. The visualized skeletal structures are unremarkable." CT abdomen, lower chest: No evidence of GGO, consolidation, normal CT scan T-max last 24 hours: 100.9 at 1431 on 10/7 Oxygen requirements: Currently on room air Antibiotics: Currently on IV Cipro and Flagyl for intra-abdominal abscess and being transitioned to po today  Diuretics: Not indicated Vitamin C and Zinc: Started on 10/15/2018 DVT Prophylaxis: Subcutaneous Lovenox recommended if general surgery is agreeable.  Remdesivir: Not indicated for now.  Currently not hypoxic currently no infiltrate on the CT scan or x-ray.  Steroids: Not indicated for now.  Will continue Monitor while the patient is actually dealing with intra-abdominal abscess as well as re-postop recovery.  Prone positioning: Patient encouraged to stay in prone position as much as possible.  Inflammatory Markers: LDH was 88, Ferritin was 256, CRP went from 17.7 -> 20.7, D-Dimer went from 2.05 -> 2.73  PPE During this encounter: Patient Isolation: Airborne + Droplet + Contact HCP PPE: CAPR, gown. gloves Patient PPE: Surgical Mask  The treatment plan and use of medications and known side effects were discussed with patient/family. It was clearly explained that there is no proven definitive treatment for COVID-19 infection yet. Any medications used here are based on case reports/anecdotal data which are not peer-reviewed and has  not been studied using randomized control trials.  Complete risks and long-term side effects are unknown, however in the best clinical judgment they  seem to be of some clinical benefit rather than medical risks. Patient/family agree with the treatment plan and want to receive these treatments as indicated.   -Continue with albuterol 2 puffs elations every 6 hours for wheezing or shortness of breath along with Robitussin-DM 10 mils p.o. every 4 PRN for cough  **Patient is being discharged today we recommend close follow-up with PCP for repeat blood work for inflammatory markers and repeat chest x-ray and 1 to 2 weeks.  If patient becomes symptomatic it was advised to him to come to the emergency room for further evaluation recommendation  Intra-abdominal Abscess -Recent lap appendectomy -Leukocytosis is improved and went from 11.8 -> 9.4 -Patient started on IV antibiotics per surgery. -Has penicillin allergy. -Pain control per general surgery and they had been giving the patient IV hydromorphone milligram every 2 hours PRN for pain not relieved with all medications and also recommended giving the patient methocarbamol 500 mg every 6 hours; currently getting 1000 mg of p.o. acetaminophen every 6 hours scheduled oxycodone 5 to 10 mg p.o. every 6 PRN for moderate pain and severe pain along with tramadol 50 to 100 mg every 6 hours for moderate to severe pain -IR mentions that they will not be able to drain the abscess and recommend reimaging in a few days. -Management per surgery for now. Diet being advanced -Given that the patient will require ongoing surgical evaluation recommend Not to Transfer Patient to [facility] for No as patient is being discharged home.  Obesity -Estimated body mass index is 37.16 kg/m as calculated from the following:   Height as of this encounter:  (1.778 m).   Weight as of this encounter: 117.5 kg. -Weight Loss and Dietary Counseling given and will need continued outpatient follow-up with PCP for dietary assistance.  CAD. -Mild 40% mid LAD stenosis in 7/26. -Chronic T wave changes. -Currently no anginal  symptoms. -Patient already follows up with cardiology outpatient.  Tobacco Abuse/Active Smoker. -Smokes 1/3 pack a day. -Requests nicotine patch for now which we will provide.  Currently on nicotine 14 mg transdermally every 24 hours -Mentions that he quit smoking for now. -Will require nicotine patch prescription on discharge.  Hyperlipidemia. -Recent Lipid Panel on 10/02/2018 showed a total cholesterol/HDL ratio of 4.9, cholesterol cholesterol level of 165, HDL of 34, triglycerides of 308, VLDL 50, LDL of 81 -Supposed to be on Lipitor 40 mg daily. -Currently on hold. -Resume on discharge.  Normocytic Anemia -Patient's hemoglobin/hematocrit went from 14.4/42.1 is now 12.8/39.0 -Likely in the setting of dilutional drop -Continue to monitor for signs and symptoms of bleeding; currently no overt bleeding noted -Recommend obtaining a anemia panel in the outpatient setting -Repeat CBC in outpatient setting  DVT prophylaxis: Enoxaparin 60 mg sq q24h Code Status: FULL CODE Family Communication: FULL CODE  Disposition Plan: Per General Surgery; Patient to be Discharged today   Consultants:   TRH  General Surgery (Primary)   Procedures: None  Antimicrobials:  Anti-infectives (From admission, onward)   Start     Dose/Rate Route Frequency Ordered Stop   10/15/18 1000  ciprofloxacin (CIPRO) IVPB 400 mg     400 mg 200 mL/hr over 60 Minutes Intravenous Every 12 hours 10/15/18 0712     10/15/18 0800  metroNIDAZOLE (FLAGYL) IVPB 500 mg     500 mg 100 mL/hr over 60 Minutes Intravenous Every  8 hours 10/15/18 1610     10/14/18 2115  ciprofloxacin (CIPRO) IVPB 400 mg     400 mg 200 mL/hr over 60 Minutes Intravenous  Once 10/14/18 2106 10/15/18 0001   10/14/18 2115  metroNIDAZOLE (FLAGYL) IVPB 500 mg     500 mg 100 mL/hr over 60 Minutes Intravenous  Once 10/14/18 2106 10/15/18 0001     Subjective: Patient was seen and examined at bedside and he denies any respiratory complaints  and states that he is feeling well.  Still has some abdominal soreness and states that he did not spike temperatures since yesterday.  Denies any cough, shortness breath, lightheadedness or dizziness.  No other concerns or complaints at this time and was hoping he can go home later today as general surgery has advanced his diet.  Objective: Vitals:   10/15/18 1432 10/15/18 1906 10/15/18 2128 10/16/18 0703  BP: 118/83  110/64 124/77  Pulse: 89  88 85  Resp: 20   20  Temp: (!) 100.9 F (38.3 C) 99.5 F (37.5 C) 99.1 F (37.3 C) 99 F (37.2 C)  TempSrc: Oral Oral Oral Oral  SpO2: 100%  100% 100%  Weight:      Height:        Intake/Output Summary (Last 24 hours) at 10/16/2018 0827 Last data filed at 10/15/2018 1700 Gross per 24 hour  Intake 1545.23 ml  Output --  Net 1545.23 ml   Filed Weights   10/14/18 1822  Weight: 117.5 kg   Examination: Physical Exam:  Constitutional: WN/WD obese AAM in NAD and appears calm sitting up in bed watching YouTube Eyes: Lids and conjunctivae normal, sclerae anicteric  ENMT: External Ears, Nose appear normal. Grossly normal hearing. Mucous membranes are moist.  Neck: Appears normal, supple, no cervical masses, normal ROM, no appreciable thyromegaly; no JVD Respiratory: Slightly diminished to auscultation bilaterally, no wheezing, rales, rhonchi or crackles. Normal respiratory effort and patient is not tachypenic. No accessory muscle use. Unlabored breathing Cardiovascular: RRR, no murmurs / rubs / gallops. S1 and S2 auscultated. No extremity edema. Abdomen: Soft, somewhat tender, Distended. No masses palpated. No appreciable hepatosplenomegaly. Bowel sounds positive x4.  GU: Deferred. Musculoskeletal: No clubbing / cyanosis of digits/nails. No joint deformity upper and lower extremities.  Skin: No rashes, lesions, ulcers on a limited skin evaluation. No induration; Warm and dry.  Neurologic: CN 2-12 grossly intact with no focal deficits. Romberg  sign and cerebellar reflexes not assessed.  Psychiatric: Normal judgment and insight. Alert and oriented x 3. Normal mood and appropriate affect.   Data Reviewed: I have personally reviewed following labs and imaging studies  CBC: Recent Labs  Lab 10/14/18 1826 10/15/18 0744 10/16/18 0357  WBC 11.8* 9.8 9.4  NEUTROABS  --   --  6.8  HGB 14.4 12.9* 12.8*  HCT 43.1 39.7 39.0  MCV 90.9 92.1 90.7  PLT 240 231 233   Basic Metabolic Panel: Recent Labs  Lab 10/14/18 1826 10/16/18 0357  NA 136 136  K 5.0 4.0  CL 102 99  CO2 25 25  GLUCOSE 77 119*  BUN 10 6  CREATININE 0.82 0.81  CALCIUM 8.9 8.7*   GFR: Estimated Creatinine Clearance: 155.7 mL/min (by C-G formula based on SCr of 0.81 mg/dL). Liver Function Tests: Recent Labs  Lab 10/14/18 1826 10/16/18 0357  AST 35 17  ALT 43 32  ALKPHOS 71 59  BILITOT 0.9 1.2  PROT 8.0 7.3  ALBUMIN 3.8 3.4*   Recent Labs  Lab 10/14/18 1826  LIPASE 33   No results for input(s): AMMONIA in the last 168 hours. Coagulation Profile: No results for input(s): INR, PROTIME in the last 168 hours. Cardiac Enzymes: No results for input(s): CKTOTAL, CKMB, CKMBINDEX, TROPONINI in the last 168 hours. BNP (last 3 results) No results for input(s): PROBNP in the last 8760 hours. HbA1C: No results for input(s): HGBA1C in the last 72 hours. CBG: No results for input(s): GLUCAP in the last 168 hours. Lipid Profile: No results for input(s): CHOL, HDL, LDLCALC, TRIG, CHOLHDL, LDLDIRECT in the last 72 hours. Thyroid Function Tests: No results for input(s): TSH, T4TOTAL, FREET4, T3FREE, THYROIDAB in the last 72 hours. Anemia Panel: Recent Labs    10/15/18 1101  FERRITIN 256   Sepsis Labs: Recent Labs  Lab 10/14/18 1936 10/15/18 0031  LATICACIDVEN 1.2 1.1    Recent Results (from the past 240 hour(s))  Blood culture (routine x 2)     Status: None (Preliminary result)   Collection Time: 10/14/18  7:10 PM   Specimen: BLOOD  Result  Value Ref Range Status   Specimen Description   Final    BLOOD RIGHT HAND Performed at [facility] [address] [place], [place] [address]    Special Requests   Final    BOTTLES DRAWN AEROBIC AND ANAEROBIC Blood Culture adequate volume Performed at [facility] [address], [place], [place] [address]    Culture   Final    NO GROWTH < 12 HOURS Performed at [facility] [address], [place], [place] [address]    Report Status PENDING  Incomplete  Blood culture (routine x 2)     Status: None (Preliminary result)   Collection Time: 10/14/18  7:36 PM   Specimen: BLOOD  Result Value Ref Range Status   Specimen Description   Final    BLOOD RIGHT ANTECUBITAL Performed at [facility] [address], [place], [place] [address]    Special Requests   Final    BOTTLES DRAWN AEROBIC AND ANAEROBIC Blood Culture adequate volume Performed at [facility] [address] [place], [place] [address]    Culture   Final    NO GROWTH < 12 HOURS Performed at [facility] [address] [place], [place] [address]    Report Status PENDING  Incomplete  SARS Coronavirus 2 [facility] order, Performed in [facility] hospital lab) Nasopharyngeal Urine, Clean Catch     Status: Abnormal   Collection Time: 10/14/18  8:21 PM   Specimen: Urine, Clean Catch; Nasopharyngeal  Result Value Ref Range Status   SARS Coronavirus 2 POSITIVE (A) NEGATIVE Final    Comment: RESULT CALLED TO, READ BACK BY AND VERIFIED WITH: Agapito Games, RN @ [phone] ON 10/14/2018 C VARNER (NOTE) If result is NEGATIVE SARS-CoV-2 target nucleic acids are NOT DETECTED. The SARS-CoV-2 RNA is generally detectable in upper and lower  respiratory specimens during the acute phase of infection. The lowest  concentration of SARS-CoV-2 viral copies this assay can detect is 250  copies / mL. A negative result does not preclude SARS-CoV-2  infection  and should not be used as the sole basis for treatment or other  patient management decisions.  A negative result may occur with  improper specimen collection / handling, submission of specimen other  than nasopharyngeal swab, presence of viral mutation(s) within the  areas targeted by this assay, and inadequate number of viral copies  (<250 copies / mL). A  negative result must be combined with clinical  observations, patient history, and epidemiological information. If result is POSITIVE SARS-CoV-2 target nucleic acids ar e DETECTED. The SARS-CoV-2 RNA is generally detectable in upper and lower  respiratory specimens during the acute phase of infection.  Positive  results are indicative of active infection with SARS-CoV-2.  Clinical  correlation with patient history and other diagnostic information is  necessary to determine patient infection status.  Positive results do  not rule out bacterial infection or co-infection with other viruses. If result is PRESUMPTIVE POSTIVE SARS-CoV-2 nucleic acids MAY BE PRESENT.   A presumptive positive result was obtained on the submitted specimen  and confirmed on repeat testing.  While 2019 novel coronavirus  (SARS-CoV-2) nucleic acids may be present in the submitted sample  additional confirmatory testing may be necessary for epidemiological  and / or clinical management purposes  to differentiate between  SARS-CoV-2 and other Sarbecovirus currently known to infect humans.  If clinically indicated additional testing with an alternate test  methodolog y [phone]) is advised. The SARS-CoV-2 RNA is generally  detectable in upper and lower respiratory specimens during the acute  phase of infection. The expected result is Negative. Fact Sheet for Patients:  BoilerBrush.com.cy Fact Sheet for Healthcare Providers: https://pope.com/ This test is not yet approved or cleared by the [place]  FDA and has been authorized for detection and/or diagnosis of SARS-CoV-2 by FDA under an Emergency Use Authorization (EUA).  This EUA will remain in effect (meaning this test can be used) for the duration of the COVID-19 declaration under Section 564(b)(1) of the Act, 21 U.S.C. section 360bbb-3(b)(1), unless the authorization is terminated or revoked sooner. Performed at [facility], [address], [place], [place] [address]     Radiology Studies: Ct Abdomen Pelvis W Contrast  Result Date: 10/14/2018 CLINICAL DATA:  Abdominal pain, distention, and fever. 3 weeks status post appendectomy. EXAM: CT ABDOMEN AND PELVIS WITH CONTRAST TECHNIQUE: Multidetector CT imaging of the abdomen and pelvis was performed using the standard protocol following bolus administration of intravenous contrast. CONTRAST:  OMNIPAQUE IOHEXOL 300 MG/ML  SOLN COMPARISON:  09/18/2018 FINDINGS: Lower Chest: No acute findings. Hepatobiliary: No hepatic masses identified. Mild hepatic steatosis. Gallbladder is unremarkable. No evidence of biliary ductal dilatation. Pancreas:  No mass or inflammatory changes. Spleen: Within normal limits in size and appearance. Adrenals/Urinary Tract: No masses identified. No evidence of hydronephrosis. Stomach/Bowel: Postop changes from appendectomy noted. A rim enhancing fluid collection is seen within the right lower quadrant mesentery which measures 4.0 x 3.2 cm, consistent with abscess. Surrounding inflammatory changes are seen. Mild dilatation and wall thickening is seen involving adjacent distal small bowel, consistent with focal ileus. No evidence of bowel obstruction. Right colonic diverticulosis again seen, however there is no evidence of diverticulitis. Vascular/Lymphatic: No pathologically enlarged lymph nodes. No abdominal aortic aneurysm. Reproductive:  No mass or other significant abnormality. Other:  None. Musculoskeletal:  No suspicious bone lesions  identified. IMPRESSION: 4.0 cm abscess in right lower quadrant mesentery, with surrounding inflammatory changes. Focal ileus involving right lower quadrant distal small bowel loops. Right colonic diverticulosis, without radiographic evidence of diverticulitis. Mild hepatic steatosis. Electronically Signed   By: Danae Orleans M.D.   On: 10/14/2018 20:55   Dg Chest Port 1 View  Result Date: 10/14/2018 CLINICAL DATA:  Fever. EXAM: PORTABLE CHEST 1 VIEW COMPARISON:  08/13/2015 FINDINGS: The heart size and mediastinal contours are within normal limits. Both lungs are clear. The visualized skeletal structures  are unremarkable. IMPRESSION: Negative.  No active disease. Electronically Signed   By: Danae OrleansJohn A Stahl M.D.   On: 10/14/2018 20:37   Scheduled Meds:  enoxaparin (LOVENOX) injection  60 mg Subcutaneous Q24H   nicotine  14 mg Transdermal Daily   vitamin C  500 mg Oral Daily   zinc sulfate  220 mg Oral Daily   Continuous Infusions:  ciprofloxacin 400 mg (10/15/18 2133)   dextrose 5 % and 0.45 % NaCl with KCl 20 mEq/L 75 mL/hr at 10/15/18 1700   metronidazole 500 mg (10/16/18 0741)    LOS: 1 day   Merlene Laughtermair Latif Michelangelo Rindfleisch, DO [facility] PAGER is on AMION  If 7PM-7AM, please contact night-coverage [email or web address] Password [email or web address] 10/16/2018, 8:27 AM

## 2018-10-16 NOTE — TOC Initial Note (Signed)
Transition of Care [facility]) - Initial/Assessment Note    Patient Details  Name: Ladainian Therien MRN: [id number] Date of Birth: April 14, 1980  [facility] CM/SW Contact:    Purcell Mouton, RN Phone Number: 10/16/2018, 11:16 AM  Clinical Narrative:                 Pt is stable at discharge. Appointment at [place] [facility] on October 22 at 2:30 PM Virtual.  Expected Discharge Plan: Home/Self Care     Patient Goals and CMS Choice Patient states their goals for this hospitalization and ongoing recovery are:: To go home.      Expected Discharge Plan and Services Expected Discharge Plan: Home/Self Care   Discharge Planning Services: CM Consult, [facility]   Living arrangements for the past 2 months: [place]                                      Prior Living Arrangements/Services Living arrangements for the past 2 months: Single Family Home Lives with:: Relatives Patient language and need for interpreter reviewed:: No Do you feel safe going back to the place where you live?: Yes               Activities of Daily Living Home Assistive Devices/Equipment: None ADL Screening (condition at time of admission) Patient's cognitive ability adequate to safely complete daily activities?: Yes Is the patient deaf or have difficulty hearing?: No Does the patient have difficulty seeing, even when wearing glasses/contacts?: No Does the patient have difficulty concentrating, remembering, or making decisions?: No Patient able to express need for assistance with ADLs?: Yes Does the patient have difficulty dressing or bathing?: No Independently performs ADLs?: Yes (appropriate for developmental age) Does the patient have difficulty walking or climbing stairs?: No Weakness of Legs: None Weakness of Arms/Hands: None  Permission Sought/Granted Permission sought to share information with : Case Manager                 Emotional Assessment Appearance:: Appears stated age     Orientation: : Oriented to Self, Oriented to Place, Oriented to  Time, Oriented to Situation      Admission diagnosis:  Intra-abdominal abscess ([place]) [K65.1] Sepsis, due to unspecified organism, unspecified whether acute organ dysfunction present [facility]) [A41.9] Patient Active Problem List   Diagnosis Date Noted  . Obesity, Class I, BMI 30.0-34.9 (see actual BMI) 10/15/2018  . COVID-19 virus infection 10/15/2018  . Intra-abdominal abscess ([place]) 10/14/2018  . Acute appendicitis 09/18/2018  . Ruptured appendicitis 09/18/2018  . Acute diverticulitis 08/30/2015  . Current smoker 07/13/2014  . Family history of premature CAD 07/13/2014  . Obese 07/13/2014  . Unstable angina ([place]) 07/13/2014  . Chest pain    PCP:  Patient, No Pcp Per Pharmacy:   [facility] ([place]), [place] [address] [place] ([place]) [place] [address] Phone: [phone] Fax: [place] [place] [address] Phone: [phone] Fax: [phone]     Social Determinants of Health (SDOH) Interventions    Readmission Risk Interventions No flowsheet data found.

## 2018-10-16 NOTE — Discharge Summary (Signed)
Patient ID: Kyle Silva [id number] Nov 22, 1980 38 y.o.  Admit date: 10/14/2018 Discharge date: 10/16/2018  Admitting Diagnosis: Intra-abdominal abscess, history of perforated appendicitis  Discharge Diagnosis Patient Active Problem List   Diagnosis Date Noted   Obesity, Class I, BMI 30.0-34.9 (see actual BMI) 10/15/2018   COVID-19 virus infection 10/15/2018   Intra-abdominal abscess ([place]) 10/14/2018   Acute appendicitis 09/18/2018   Ruptured appendicitis 09/18/2018   Acute diverticulitis 08/30/2015   Current smoker 07/13/2014   Family history of premature CAD 07/13/2014   Obese 07/13/2014   Unstable angina ([place]) 07/13/2014   Chest pain     Consultants Hospitalist  Interventional Radiology   Procedures None   Hospital Course:  Kyle Silva is a 37 y.o. male who was admitted in September for perforated appendicitis with abscess and taken to the operating room on 09/18/2018 by Dr. Johnathan Hausen.  He was treated with IV antibiotics during hospitalization and discharged home with his drain in place and oral antibiotics.  His drain was removed on September 18 in the office.  He followed up earlier this week in the office with continued right lower quadrant abdominal pain as well as fever at home.  He was scheduled to have an outpatient CT scan done to evaluate this, but his pain worsened, so he presented to the ED on 10/6.  In the ED patient was found to have a 4 cm abscess near the site of the appendectomy adjacent to the small bowel mesentery with secondary inflammatory changes in the distal small bowel.  General surgery was consulted and admitted.  During routine screening for COVID-19, the patient's test came back positive.  His prior test from admission in September was negative.  He was asymptomatic from this.  Hospitalist was consulted.  Please see their progress notes for further details.  He was arranged to have outpatient follow-up with PCP for this.  In regards to  patient's intra-abdominal abscess, interventional radiology was consulted but the abscess was not felt to be amenable to percutaneous drainage at this time.  Patient was monitored overnight on IV antibiotics.  Patient's leukocytosis resolved and fever curve downtrended.  His diet was advanced and tolerated. On 10/8, the patient was voiding well, tolerating diet, ambulating well, pain well controlled, vital signs stable, and felt stable for discharge home on oral abx.  He was recommended to quarantine at home for the next 2 weeks.  He was provided a note for work.  He was scheduled to see Dr. Hassell Done and his PCP as below.  Physical Exam: Please see progress note from earlier today  Allergies as of 10/16/2018      Reactions   Penicillins Anaphylaxis   Has patient had a PCN reaction causing immediate rash, facial/tongue/throat swelling, SOB or lightheadedness with hypotension:Yes Has patient had a PCN reaction causing severe rash involving mucus membranes or skin necrosis:unsure Has patient had a PCN reaction that required hospitalization:Yes Has patient had a PCN reaction occurring within the last 10 years:Yes If all of the above answers are "NO", then may proceed with Cephalosporin use.       Medication List    STOP taking these medications   docusate sodium 100 MG capsule Commonly known as: COLACE   HYDROcodone-acetaminophen 5-325 MG tablet Commonly known as: NORCO/VICODIN   nicotine 7 mg/24hr patch Commonly known as: NICODERM CQ - dosed in mg/24 hr Replaced by: nicotine 14 mg/24hr patch     TAKE these medications   aspirin EC 81 MG tablet Take  81 mg by mouth daily.   atorvastatin 40 MG tablet Commonly known as: LIPITOR Take 1 tablet (40 mg total) by mouth daily.   ciprofloxacin 500 MG tablet Commonly known as: Cipro Take 1 tablet (500 mg total) by mouth 2 (two) times daily for 12 days.   metroNIDAZOLE 500 MG tablet Commonly known as: Flagyl Take 1 tablet (500 mg total) by  mouth 3 (three) times daily for 12 days. DO NOT CONSUME ALCOHOL WHILE TAKING THIS MEDICATION.   nicotine 14 mg/24hr patch Commonly known as: NICODERM CQ - dosed in mg/24 hours Place 1 patch (14 mg total) onto the skin daily. Start taking on: October 17, 2018 Replaces: nicotine 7 mg/24hr patch   oxyCODONE 5 MG immediate release tablet Commonly known as: Oxy IR/ROXICODONE Take 1 tablet (5 mg total) by mouth every 6 (six) hours as needed for breakthrough pain.   pantoprazole 40 MG tablet Commonly known as: PROTONIX Take 1 tablet (40 mg total) by mouth daily.   polyethylene glycol 17 g packet Commonly known as: MIRALAX / GLYCOLAX Take 17 g by mouth daily as needed for mild constipation.        Follow-up Information    Primary Care at [facility] Follow up on 10/30/2018.   Specialty: Family Medicine Why: Will call you on October 22 at 2:30 PM. With Dr. Delford Field. Please answer you phone.  Contact information: [address] [place] [address] [phone]       Surgery, [place] Follow up on 11/05/2018.   Specialty: General Surgery Why: with Dr. Daphine Deutscher at 1130am.  Please arrive 30 minutes prior to your appointment for follow up. Please bring a copy of your photo ID and insurance card to the appointment.  Contact information: [address] [place] [address] [phone]           Signed: Leary Roca, [facility] Surgery 10/16/2018, 3:58 PM Pager: [phone]

## 2018-10-16 NOTE — Plan of Care (Signed)
Discharge instructions reviewed with patient, questions answered, verbalized understanding.  Patient transported via wheelchair to main entrance to be picked up by significant other.  Did stress to patient that he is to self quarantine for 2 weeks which he verbalized understanding.

## 2018-10-16 NOTE — Telephone Encounter (Signed)
Call received from Purcell Mouton, RN [facility] requesting hospital follow up appointment for patient. He has no PCP. Informed her that appt has been scheduled for 10/30/2018 @ 1430 @ [facility] - televisit.

## 2018-10-16 NOTE — Progress Notes (Signed)
Subjective: CC: Pain improved from yesterday. He is tolerating a diet and passing flatus. No N/V.   Objective: Vital signs in last 24 hours: Temp:  [99 F (37.2 C)-100.9 F (38.3 C)] 99 F (37.2 C) (10/08 0703) Pulse Rate:  [70-89] 85 (10/08 0703) Resp:  [20] 20 (10/08 0703) BP: (110-141)/(64-83) 124/77 (10/08 0703) SpO2:  [100 %] 100 % (10/08 0703) Last BM Date: 10/14/18  Intake/Output from previous day: 10/07 0701 - 10/08 0700 In: 1720.2 [P.O.:720; I.V.:600.2; IV Piggyback:400] Out: -  Intake/Output this shift: No intake/output data recorded.  PE: Gen:  Alert, NAD, pleasant HEENT: EOM's intact, pupils equal and round Card:  RRR, no M/G/R heard Pulm:  CTAB, no W/R/R, effort normal Abd: Soft, ND, less tenderness today in the RLQ, no involuntary guarding, rebound or rigidity. No peritonitis. Incisions with glue intact appears well and are without drainage, bleeding, or signs of infection. Prior drain site c/d/i. +BS Ext:  No LE edema Psych: A&Ox3  Skin: no rashes noted, warm and dry  Lab Results:  Recent Labs    10/15/18 0744 10/16/18 0357  WBC 9.8 9.4  HGB 12.9* 12.8*  HCT 39.7 39.0  PLT 231 233   BMET Recent Labs    10/14/18 1826 10/16/18 0357  NA 136 136  K 5.0 4.0  CL 102 99  CO2 25 25  GLUCOSE 77 119*  BUN 10 6  CREATININE 0.82 0.81  CALCIUM 8.9 8.7*   PT/INR No results for input(s): LABPROT, INR in the last 72 hours. CMP     Component Value Date/Time   NA 136 10/16/2018 0357   K 4.0 10/16/2018 0357   CL 99 10/16/2018 0357   CO2 25 10/16/2018 0357   GLUCOSE 119 (H) 10/16/2018 0357   BUN 6 10/16/2018 0357   CREATININE 0.81 10/16/2018 0357   CALCIUM 8.7 (L) 10/16/2018 0357   PROT 7.3 10/16/2018 0357   ALBUMIN 3.4 (L) 10/16/2018 0357   AST 17 10/16/2018 0357   ALT 32 10/16/2018 0357   ALKPHOS 59 10/16/2018 0357   BILITOT 1.2 10/16/2018 0357   GFRNONAA >60 10/16/2018 0357   GFRAA >60 10/16/2018 0357   Lipase     Component  Value Date/Time   LIPASE 33 10/14/2018 1826       Studies/Results: Ct Abdomen Pelvis W Contrast  Result Date: 10/14/2018 CLINICAL DATA:  Abdominal pain, distention, and fever. 3 weeks status post appendectomy. EXAM: CT ABDOMEN AND PELVIS WITH CONTRAST TECHNIQUE: Multidetector CT imaging of the abdomen and pelvis was performed using the standard protocol following bolus administration of intravenous contrast. CONTRAST:  132mL OMNIPAQUE IOHEXOL 300 MG/ML  SOLN COMPARISON:  09/18/2018 FINDINGS: Lower Chest: No acute findings. Hepatobiliary: No hepatic masses identified. Mild hepatic steatosis. Gallbladder is unremarkable. No evidence of biliary ductal dilatation. Pancreas:  No mass or inflammatory changes. Spleen: Within normal limits in size and appearance. Adrenals/Urinary Tract: No masses identified. No evidence of hydronephrosis. Stomach/Bowel: Postop changes from appendectomy noted. A rim enhancing fluid collection is seen within the right lower quadrant mesentery which measures 4.0 x 3.2 cm, consistent with abscess. Surrounding inflammatory changes are seen. Mild dilatation and wall thickening is seen involving adjacent distal small bowel, consistent with focal ileus. No evidence of bowel obstruction. Right colonic diverticulosis again seen, however there is no evidence of diverticulitis. Vascular/Lymphatic: No pathologically enlarged lymph nodes. No abdominal aortic aneurysm. Reproductive:  No mass or other significant abnormality. Other:  None. Musculoskeletal:  No suspicious bone lesions  identified. IMPRESSION: 4.0 cm abscess in right lower quadrant mesentery, with surrounding inflammatory changes. Focal ileus involving right lower quadrant distal small bowel loops. Right colonic diverticulosis, without radiographic evidence of diverticulitis. Mild hepatic steatosis. Electronically Signed   By: Danae Orleans M.D.   On: 10/14/2018 20:55   Dg Chest Port 1 View  Result Date: 10/14/2018 CLINICAL DATA:   Fever. EXAM: PORTABLE CHEST 1 VIEW COMPARISON:  08/13/2015 FINDINGS: The heart size and mediastinal contours are within normal limits. Both lungs are clear. The visualized skeletal structures are unremarkable. IMPRESSION: Negative.  No active disease. Electronically Signed   By: Danae Orleans M.D.   On: 10/14/2018 20:37    Anti-infectives: Anti-infectives (From admission, onward)   Start     Dose/Rate Route Frequency Ordered Stop   10/15/18 1000  ciprofloxacin (CIPRO) IVPB 400 mg     400 mg 200 mL/hr over 60 Minutes Intravenous Every 12 hours 10/15/18 0712     10/15/18 0800  metroNIDAZOLE (FLAGYL) IVPB 500 mg     500 mg 100 mL/hr over 60 Minutes Intravenous Every 8 hours 10/15/18 0712     10/14/18 2115  ciprofloxacin (CIPRO) IVPB 400 mg     400 mg 200 mL/hr over 60 Minutes Intravenous  Once 10/14/18 2106 10/15/18 0001   10/14/18 2115  metroNIDAZOLE (FLAGYL) IVPB 500 mg     500 mg 100 mL/hr over 60 Minutes Intravenous  Once 10/14/18 2106 10/15/18 0001       Assessment/Plan COVID + - Appreciate hospitalist assistance.   Hx of Laparoscopic Appendectomy by Dr. Daphine Deutscher on 9/10 for perforated appendicitis with abscess  RLQ intra-abdominal abscess - Drain removed in office on 9/18 - CT 10/6 with 4cm abscess in the RLQ  - IR unable to drain at this time.  - WBC normalized (11.8>9.8>9.4) - Fever curve trending down - If patient tolerates diet and pain is controlled with oral meds will plan for d/c later today with 14 days of abx and follow up with Dr. Daphine Deutscher in the office   FEN - Regular  VTE - SCDs, Lovenox ID - Cipro/Flagyl >>   LOS: 1 day    Jacinto Halim , [facility] Surgery 10/16/2018, 10:16 AM Pager: [phone]

## 2018-10-19 LAB — CULTURE, BLOOD (ROUTINE X 2)
Culture: NO GROWTH
Culture: NO GROWTH
Special Requests: ADEQUATE
Special Requests: ADEQUATE

## 2018-10-22 ENCOUNTER — Other Ambulatory Visit: Payer: Self-pay | Admitting: Student

## 2018-10-30 ENCOUNTER — Ambulatory Visit (INDEPENDENT_AMBULATORY_CARE_PROVIDER_SITE_OTHER): Payer: 59 | Admitting: Family Medicine

## 2018-10-30 DIAGNOSIS — I2510 Atherosclerotic heart disease of native coronary artery without angina pectoris: Secondary | ICD-10-CM

## 2018-10-30 DIAGNOSIS — K651 Peritoneal abscess: Secondary | ICD-10-CM

## 2018-10-30 DIAGNOSIS — R7303 Prediabetes: Secondary | ICD-10-CM | POA: Diagnosis not present

## 2018-10-30 NOTE — Progress Notes (Signed)
Virtual Visit via Telephone Note  I connected with Kyle Silva, on 10/30/2018 at 2:46 PM by telephone due to the COVID-19 pandemic and verified that I am speaking with the correct person using two identifiers.   Consent: I discussed the limitations, risks, security and privacy concerns of performing an evaluation and management service by telephone and the availability of in person appointments. I also discussed with the patient that there may be a patient responsible charge related to this service. The patient expressed understanding and agreed to proceed.   Location of Patient: In the car  Location of Provider: Clinic   Persons participating in Telemedicine visit: Camrin Gearheart walker -CMA Dr. Felecia Shelling     History of Present Illness: 38 year old male with CAD, Prediabetes (A1c 6.0) here to establish care after hospitalization for intra-abdominal abscess at [facility] from 10/6 through 10/16/2018.  He was previously admitted for perforated appendicitis (status post laparoscopic appendectomy and drainage of abscess)  a month prior. On presentation he had leukocytosis and a fever. During his hospital course he tested positive for COVID-19 on 10/14/2018; his abscess was treated with IV antibiotics with improvement in symptoms and subsequently discharged with an abdominal drain.  He was asymptomatic for COVID-19. Labs during hospitalization also revealed previous diabetes with A1c of 6.0  He has completed a 2-week course of quarantine post discharge. Dressings are being done by his girlfriend. He has intermittent constipation and uses a stool softner. He is worried about the abscess recurring in the future but at this time thinks he has some improvement. Appointment with General Surgery is not until 11/05/18. He is on short term disability from his place of work.  Past Medical History:  Diagnosis Date  . Acute appendicitis 09/18/2018  . Acute diverticulitis 08/30/2015   . CAD (coronary artery disease)    Mild with 40% LAD stenosis by cath in 2016  . Chest pain   . Current smoker 07/13/2014  . Diverticulitis   . Epididymitis   . Family history of adverse reaction to anesthesia    "dad had allergic reaction to anesthesia"   . GERD (gastroesophageal reflux disease)   . Obese 07/13/2014  . Ruptured appendicitis 09/18/2018   Allergies  Allergen Reactions  . Penicillins Anaphylaxis    Has patient had a PCN reaction causing immediate rash, facial/tongue/throat swelling, SOB or lightheadedness with hypotension:Yes Has patient had a PCN reaction causing severe rash involving mucus membranes or skin necrosis:unsure Has patient had a PCN reaction that required hospitalization:Yes Has patient had a PCN reaction occurring within the last 10 years:Yes If all of the above answers are "NO", then may proceed with Cephalosporin use.      Current Outpatient Medications on File Prior to Visit  Medication Sig Dispense Refill  . aspirin EC 81 MG tablet Take 81 mg by mouth daily.    Marland Kitchen atorvastatin (LIPITOR) 40 MG tablet Take 1 tablet (40 mg total) by mouth daily. 90 tablet 3  . pantoprazole (PROTONIX) 40 MG tablet Take 1 tablet (40 mg total) by mouth daily. 90 tablet 3   No current facility-administered medications on file prior to visit.     Observations/Objective: Awake, alert, oriented x3 Not in acute distress  CMP Latest Ref Rng & Units 10/16/2018 10/14/2018 09/20/2018  Glucose 70 - 99 mg/dL 119(H) 77 108(H)  BUN 6 - 20 mg/dL 6 10 6   Creatinine 0.61 - 1.24 mg/dL 0.81 0.82 0.99  Sodium 135 - 145 mmol/L 136 136 138  Potassium 3.5 - 5.1 mmol/L 4.0 5.0 3.8  Chloride 98 - 111 mmol/L 99 102 102  CO2 22 - 32 mmol/L 25 25 28   Calcium 8.9 - 10.3 mg/dL ) 8.9 0.1(V)  Total Protein 6.5 - 8.1 g/dL 7.3 8.0 -  Total Bilirubin 0.3 - 1.2 mg/dL 1.2 0.9 -  Alkaline Phos 38 - 126 U/L 59 71 -  AST 15 - 41 U/L 17 35 -  ALT 0 - 44 U/L 32 43 -    Lab Results  Component  Value Date   WBC 9.4 10/16/2018   HGB 12.8 (L) 10/16/2018   HCT 39.0 10/16/2018   MCV 90.7 10/16/2018   PLT 233 10/16/2018    Lab Results  Component Value Date   HGBA1C 6.0 (H) 10/02/2018     Assessment and Plan: 1. Prediabetes Controlled with A1c of 6.0 We will schedule with clinical pharmacist for diabetic education to help prevent progression to diabetes mellitus No medication initiated at this time  2. Intra-abdominal abscess [facility]) Currently with abdominal drain Improving Keep appointment with general surgery  3. Coronary artery disease involving native coronary artery of native heart without angina pectoris Stable   Follow Up Instructions: Return for Prediabetes education with [facility]; 3 months with PCP.    I discussed the assessment and treatment plan with the patient. The patient was provided an opportunity to ask questions and all were answered. The patient agreed with the plan and demonstrated an understanding of the instructions.   The patient was advised to call back or seek an in-person evaluation if the symptoms worsen or if the condition fails to improve as anticipated.     I provided 30 minutes total of non-face-to-face time during this encounter including median intraservice time, reviewing previous notes, labs, imaging, medications, management and patient verbalized understanding.     Franky Macho, MD, FAAFP. [facility] [place], [place]   10/30/2018, 2:46 PM

## 2018-10-30 NOTE — Progress Notes (Signed)
States that he never had any COVID symptoms. Is still asymptomatic.  States that he thinks abscess is healing fine. Has an appointment to follow up on 10/28.

## 2018-11-13 ENCOUNTER — Other Ambulatory Visit: Payer: 59

## 2018-11-13 ENCOUNTER — Ambulatory Visit: Payer: 59 | Admitting: Pharmacist

## 2018-11-26 ENCOUNTER — Emergency Department (HOSPITAL_COMMUNITY)
Admission: EM | Admit: 2018-11-26 | Discharge: 2018-11-26 | Disposition: A | Discharge status: Home / Self Care | Payer: 59 | Attending: Emergency Medicine | Admitting: Emergency Medicine

## 2018-11-26 ENCOUNTER — Emergency Department (HOSPITAL_COMMUNITY): Payer: 59

## 2018-11-26 ENCOUNTER — Other Ambulatory Visit: Payer: Self-pay

## 2018-11-26 DIAGNOSIS — Z7982 Long term (current) use of aspirin: Secondary | ICD-10-CM | POA: Diagnosis not present

## 2018-11-26 DIAGNOSIS — U071 COVID-19: Secondary | ICD-10-CM | POA: Diagnosis not present

## 2018-11-26 DIAGNOSIS — I2510 Atherosclerotic heart disease of native coronary artery without angina pectoris: Secondary | ICD-10-CM | POA: Insufficient documentation

## 2018-11-26 DIAGNOSIS — R1031 Right lower quadrant pain: Secondary | ICD-10-CM | POA: Diagnosis present

## 2018-11-26 DIAGNOSIS — K920 Hematemesis: Secondary | ICD-10-CM | POA: Diagnosis not present

## 2018-11-26 DIAGNOSIS — R140 Abdominal distension (gaseous): Secondary | ICD-10-CM | POA: Diagnosis not present

## 2018-11-26 DIAGNOSIS — R39198 Other difficulties with micturition: Secondary | ICD-10-CM | POA: Diagnosis not present

## 2018-11-26 DIAGNOSIS — Z79899 Other long term (current) drug therapy: Secondary | ICD-10-CM | POA: Diagnosis not present

## 2018-11-26 DIAGNOSIS — Z87891 Personal history of nicotine dependence: Secondary | ICD-10-CM | POA: Insufficient documentation

## 2018-11-26 LAB — URINALYSIS, ROUTINE W REFLEX MICROSCOPIC
Bilirubin Urine: NEGATIVE
Glucose, UA: NEGATIVE mg/dL
Hgb urine dipstick: NEGATIVE
Ketones, ur: 5 mg/dL — AB
Leukocytes,Ua: NEGATIVE
Nitrite: NEGATIVE
Protein, ur: NEGATIVE mg/dL
Specific Gravity, Urine: 1.021 (ref 1.005–1.030)
pH: 5 (ref 5.0–8.0)

## 2018-11-26 LAB — COMPREHENSIVE METABOLIC PANEL
ALT: 97 U/L — ABNORMAL HIGH (ref 0–44)
AST: 54 U/L — ABNORMAL HIGH (ref 15–41)
Albumin: 4.4 g/dL (ref 3.5–5.0)
Alkaline Phosphatase: 60 U/L (ref 38–126)
Anion gap: 9 (ref 5–15)
BUN: 11 mg/dL (ref 6–20)
CO2: 26 mmol/L (ref 22–32)
Calcium: 9.1 mg/dL (ref 8.9–10.3)
Chloride: 102 mmol/L (ref 98–111)
Creatinine, Ser: 0.83 mg/dL (ref 0.61–1.24)
GFR calc Af Amer: >60 mL/min (ref >60)
GFR calc non Af Amer: >60 mL/min (ref >60)
Glucose, Bld: 83 mg/dL (ref 70–99)
Potassium: 3.7 mmol/L (ref 3.5–5.1)
Sodium: 137 mmol/L (ref 135–145)
Total Bilirubin: 0.7 mg/dL (ref 0.3–1.2)
Total Protein: 7.6 g/dL (ref 6.5–8.1)

## 2018-11-26 LAB — CBC
HCT: 47.2 % (ref 39.0–52.0)
Hemoglobin: 15.5 g/dL (ref 13.0–17.0)
MCH: 30 pg (ref 26.0–34.0)
MCHC: 32.8 g/dL (ref 30.0–36.0)
MCV: 91.5 fL (ref 80.0–100.0)
Platelets: 326 10*3/uL (ref 150–400)
RBC: 5.16 MIL/uL (ref 4.22–5.81)
RDW: 14.3 % (ref 11.5–15.5)
WBC: 12.4 10*3/uL — ABNORMAL HIGH (ref 4.0–10.5)
nRBC: 0 % (ref 0.0–0.2)

## 2018-11-26 LAB — LIPASE, BLOOD: Lipase: 33 U/L (ref 11–51)

## 2018-11-26 IMAGING — CT CT ABD-PELV W/ CM
2 of 4 series · 16 of 46 positions shown, 18 images · IV contrast (omnipaque)
Comparison: [DATE]

CLINICAL DATA: Right upper and right lower quadrant abdominal pain
beginning today. History of appendectomy in [DATE] with subsequent
abscess.

EXAM:
CT ABDOMEN AND PELVIS WITH CONTRAST
TECHNIQUE: Multidetector CT imaging of the abdomen and pelvis was performed
using the standard protocol following bolus administration of
intravenous contrast.
CONTRAST:  100mL OMNIPAQUE IOHEXOL 300 MG/ML  SOLN

[Series 3: axial st · axial · 0.82mm/px · z∈[+996,+1411]mm · 13 of 95 slices shown, 15 images]
[im 6/95  soft-tissue]
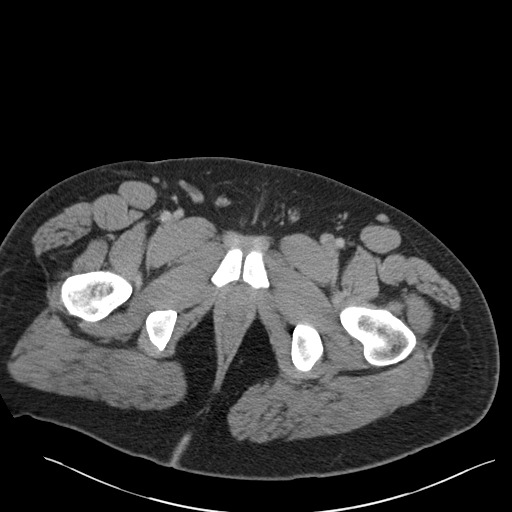
[im 6/95  bone]
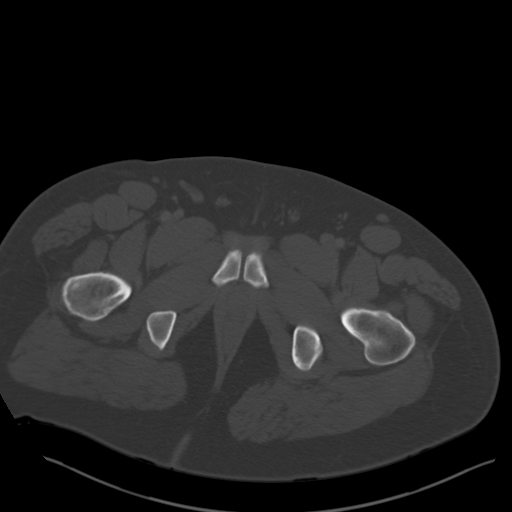
[im 11/95  soft-tissue]
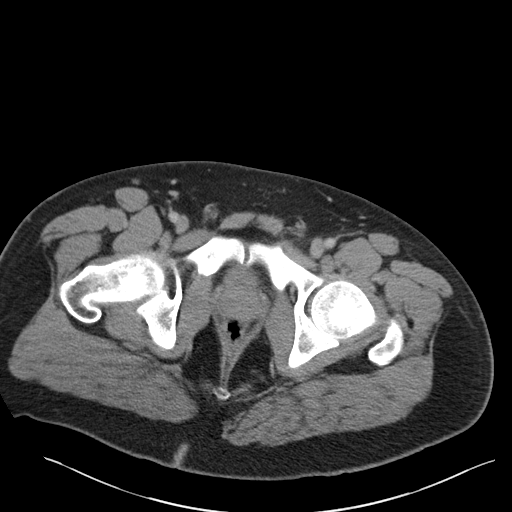
[im 21/95  soft-tissue]
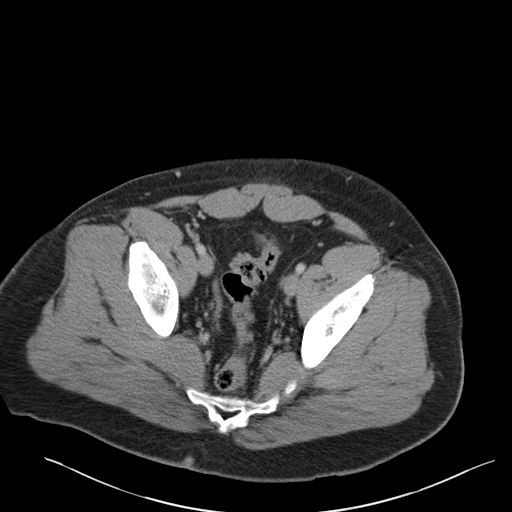
[im 27/95  soft-tissue]
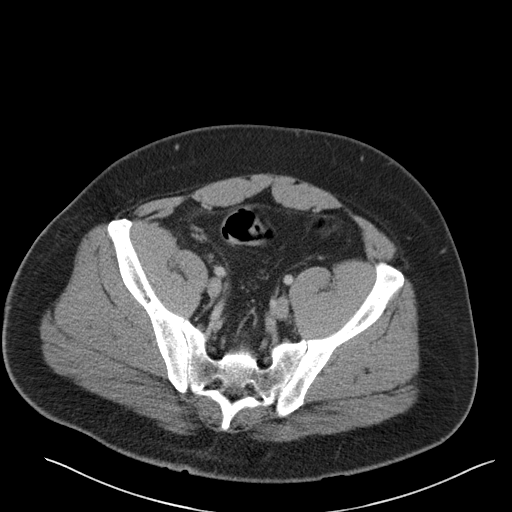
[im 32/95  soft-tissue]
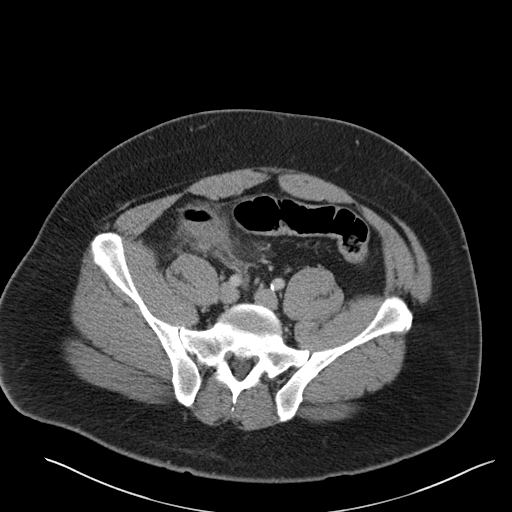
[im 42/95  soft-tissue]
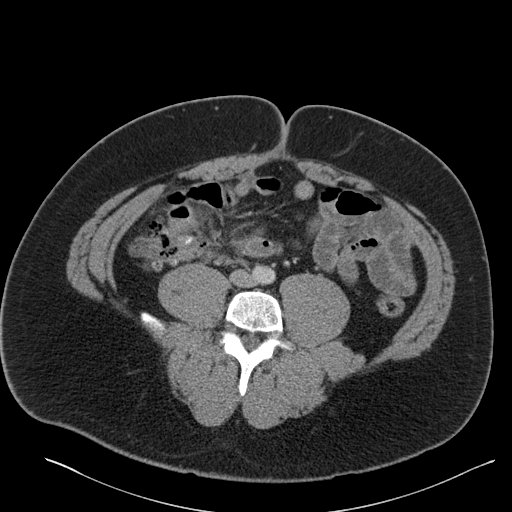
[im 48/95  soft-tissue]
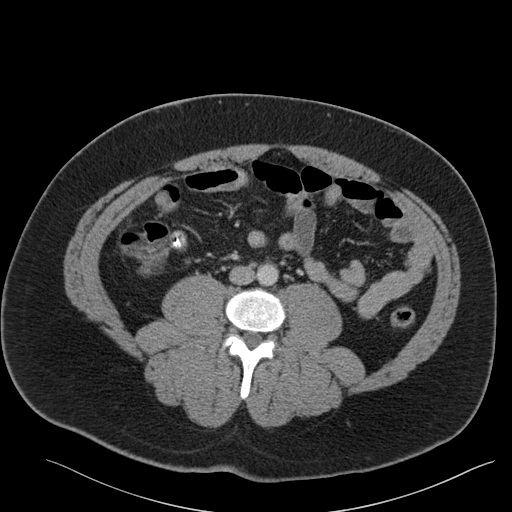
[im 53/95  soft-tissue]
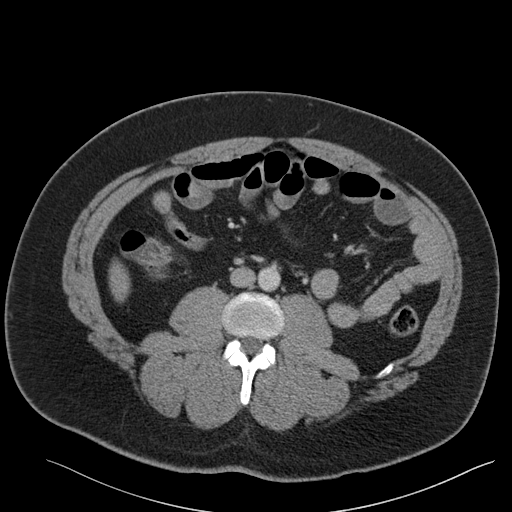
[im 63/95  soft-tissue]
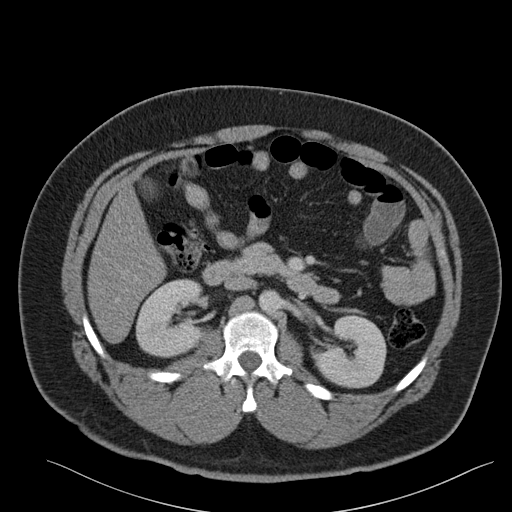
[im 63/95  bone]
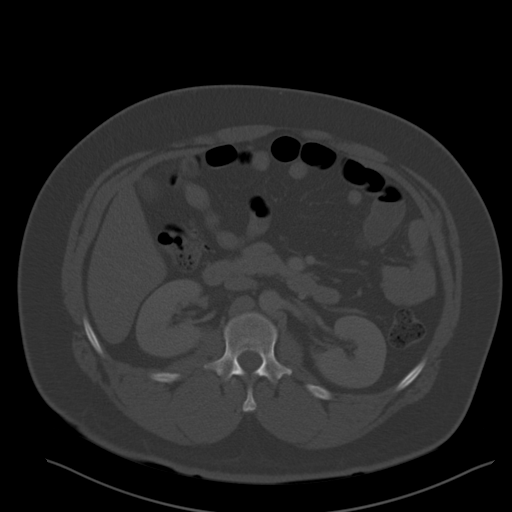
[im 68/95  soft-tissue]
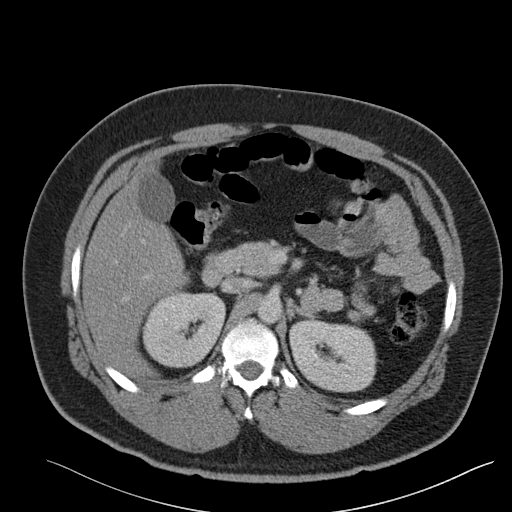
[im 74/95  soft-tissue]
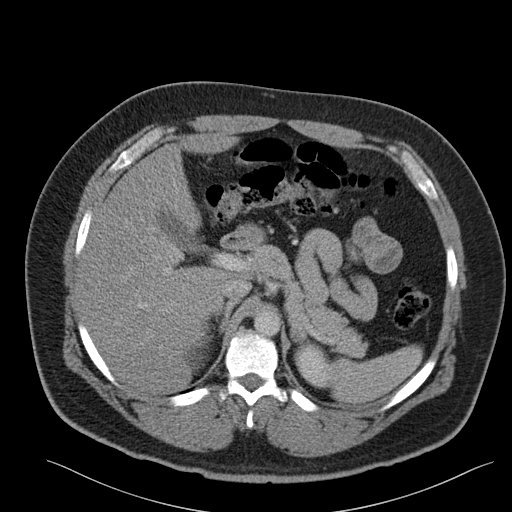
[im 84/95  soft-tissue]
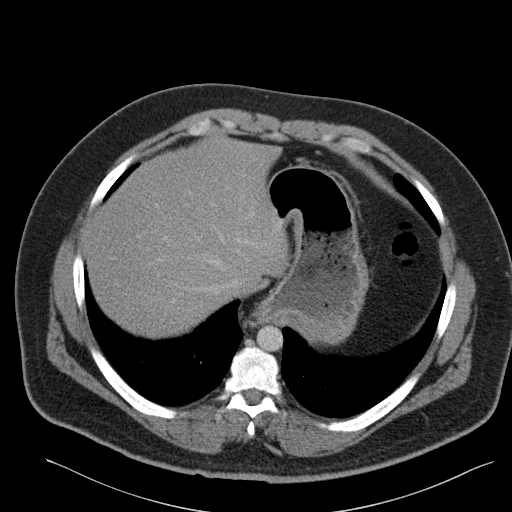
[im 89/95  soft-tissue]
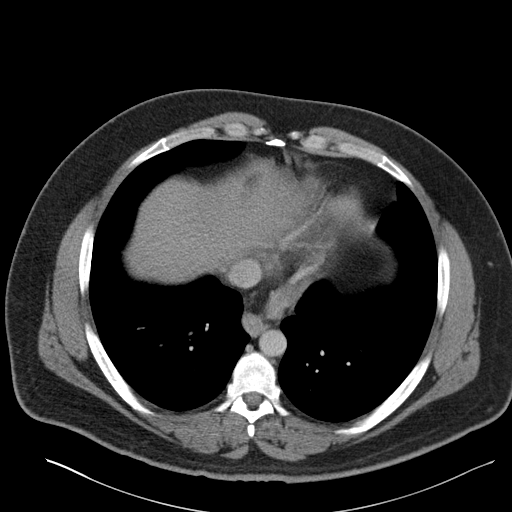

[Series 4: coronal st · coronal · 0.80mm/px · 3 of 166 slices shown]
[im 56/166  soft-tissue]
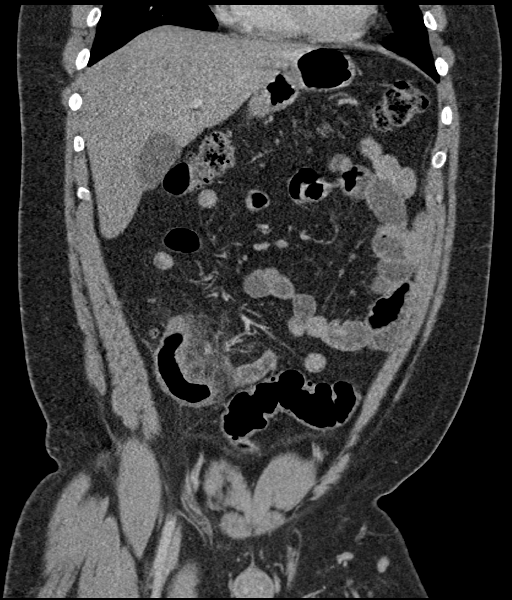
[im 74/166  soft-tissue]
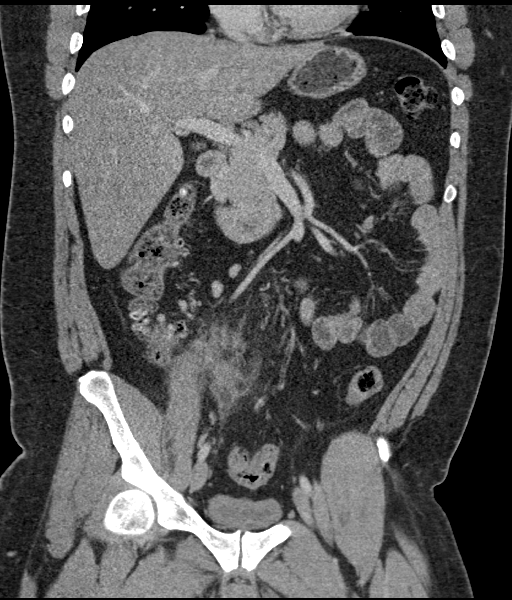
[im 92/166  soft-tissue]
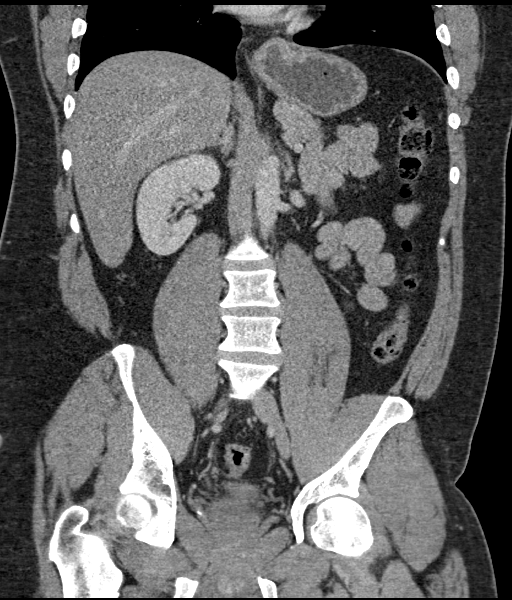

[16 of 46 positions shown; findings below may reference images not displayed]

FINDINGS: Lower chest: Clear lung bases.

Hepatobiliary: Mildly diminished attenuation of the liver diffusely
which may reflect mild steatosis. No focal liver abnormality.
Unremarkable gallbladder. No biliary dilatation.

Pancreas: Unremarkable.

Spleen: Unremarkable.

Adrenals/Urinary Tract: Unremarkable adrenal glands. No evidence of
renal mass, calculi, or hydronephrosis. Under distended bladder.

Stomach/Bowel: There is a small sliding hiatal hernia with small
volume fluid partially visualized in the distal esophagus which may
reflect reflux. There is no evidence of bowel obstruction. Sequelae
of appendectomy are again identified. There is persistent
inflammation in the right lower quadrant medial and inferior to the
cecum with multiple small foci of extraluminal gas within the area
of inflammation, however the 4 cm fluid collection on the prior CT
has resolved and no new fluid collection is identified. There is
persistent wall thickening of the terminal ileum which is borderline
dilated, measuring 3 cm in diameter. Right-sided colonic
diverticulosis is again noted without definite evidence of acute
diverticulitis.

Vascular/Lymphatic: Aortic atherosclerosis without aneurysm. No
enlarged lymph nodes.

Reproductive: Unremarkable prostate.

Other: No significant intraperitoneal free fluid.

Musculoskeletal: No acute osseous abnormality or suspicious osseous
lesion.
IMPRESSION: 1. Persistent inflammation in the right lower quadrant with multiple
small foci of extraluminal gas, however there is no residual or new
fluid collection.
2. Persistent inflammation of the terminal ileum which is mildly
dilated likely reflecting focal ileus.
3. Right-sided colonic diverticulosis.
4. Aortic Atherosclerosis ([S6]-[S6]).

## 2018-11-26 MED ORDER — ALUM & MAG HYDROXIDE-SIMETH 200-200-20 MG/5ML PO SUSP
30.0000 mL | Freq: Once | ORAL | Status: AC
  Administered 2018-11-26: 30 mL via ORAL
  Filled 2018-11-26: qty 30

## 2018-11-26 MED ORDER — CIPROFLOXACIN HCL 500 MG PO TABS: 500.0000 mg | ORAL_TABLET | Freq: Two times a day (BID) | ORAL | 0 refills | Status: AC

## 2018-11-26 MED ORDER — LIDOCAINE VISCOUS HCL 2 % MT SOLN: 15.0000 mL | Freq: Once | OROMUCOSAL | Status: DC

## 2018-11-26 MED ORDER — FENTANYL CITRATE (PF) 100 MCG/2ML IJ SOLN
100.0000 ug | INTRAMUSCULAR | Status: DC | PRN
  Administered 2018-11-26 (×2): 100 ug via INTRAVENOUS
  Filled 2018-11-26 (×2): qty 2

## 2018-11-26 MED ORDER — CIPROFLOXACIN HCL 500 MG PO TABS: 500.0000 mg | ORAL_TABLET | Freq: Two times a day (BID) | ORAL | 0 refills | Status: DC

## 2018-11-26 MED ORDER — METRONIDAZOLE 500 MG PO TABS: 500.0000 mg | ORAL_TABLET | Freq: Three times a day (TID) | ORAL | 0 refills | Status: AC

## 2018-11-26 MED ORDER — MELOXICAM 15 MG PO TABS: 7.5000 mg | ORAL_TABLET | Freq: Every day | ORAL | 0 refills | Status: DC

## 2018-11-26 MED ORDER — HYDROMORPHONE HCL 1 MG/ML IJ SOLN
1.0000 mg | Freq: Once | INTRAMUSCULAR | Status: AC
  Administered 2018-11-26: 1 mg via INTRAVENOUS
  Filled 2018-11-26: qty 1

## 2018-11-26 MED ORDER — MELOXICAM 15 MG PO TABS: 15.0000 mg | ORAL_TABLET | Freq: Every day | ORAL | 0 refills | Status: DC

## 2018-11-26 MED ORDER — IOHEXOL 300 MG/ML  SOLN
100.0000 mL | Freq: Once | INTRAMUSCULAR | Status: AC | PRN
  Administered 2018-11-26: 16:00:00 100 mL via INTRAVENOUS

## 2018-11-26 MED ORDER — METRONIDAZOLE 500 MG PO TABS: 500.0000 mg | ORAL_TABLET | Freq: Three times a day (TID) | ORAL | 0 refills | Status: DC

## 2018-11-26 MED ORDER — SODIUM CHLORIDE (PF) 0.9 % IJ SOLN
INTRAMUSCULAR | Status: AC
  Filled 2018-11-26: qty 50

## 2018-11-26 MED ORDER — SODIUM CHLORIDE 0.9% FLUSH
3.0000 mL | Freq: Once | INTRAVENOUS | Status: AC
  Administered 2018-11-26: 13:00:00 3 mL via INTRAVENOUS

## 2018-11-26 NOTE — ED Triage Notes (Signed)
Pt reports abdominal pain that started today. Pt denies N/V/D. Patient denies constipation.

## 2018-11-26 NOTE — ED Notes (Signed)
Pt verbalizes understanding of DC instructions. Pt belongings returned and is ambulatory out of ED.  

## 2018-11-26 NOTE — ED Provider Notes (Signed)
[facility] DEPT Provider Note   CSN: [id number] Arrival date & time: 11/26/18  1217     History   Chief Complaint Chief Complaint  Patient presents with  . Abdominal Pain    HPI Kyle Silva is a 38 y.o. male with a past medical history of diverticulitis, CAD, tobacco abuse, GERD, and hyperlipidemia who presents to the ED due to a sudden onset of right lower quadrant abdominal pain.  Patient had a laparoscopic appendectomy on 0/35/4656 complicated by perforation and abscess formation. Patient was also admitted on 10/6 due to 4 cm intra-abdominal abscess which was not amendable to drainage and was treated with IV antibiotics.  Patient notes his right lower quadrant abdominal pain started this morning and is associated with 2 episodes of  Non-bilious emesis last night.  Patient notes minor specks of blood in the emesis. He rates his pain a 7/10. He has not tried anything for pain prior to arrival. Patient notes his last BM was this morning, but states it feels difficult to urinate and have bowel movement. Patient denies fever, chills, chest pain, diarrhea, and shortness of breath.    Past Medical History:  Diagnosis Date  . Acute appendicitis 09/18/2018  . Acute diverticulitis 08/30/2015  . CAD (coronary artery disease)    Mild with 40% LAD stenosis by cath in 2016  . Chest pain   . Current smoker 07/13/2014  . Diverticulitis   . Epididymitis   . Family history of adverse reaction to anesthesia    "dad had allergic reaction to anesthesia"   . GERD (gastroesophageal reflux disease)   . Obese 07/13/2014  . Ruptured appendicitis 09/18/2018    Patient Active Problem List   Diagnosis Date Noted  . Obesity, Class I, BMI 30.0-34.9 (see actual BMI) 10/15/2018  . COVID-19 virus infection 10/15/2018  . Intra-abdominal abscess ([place]) 10/14/2018  . Acute appendicitis 09/18/2018  . Ruptured appendicitis 09/18/2018  . Acute diverticulitis 08/30/2015  . Current  smoker 07/13/2014  . Family history of premature CAD 07/13/2014  . Obese 07/13/2014  . Unstable angina ([place]) 07/13/2014  . Chest pain     Past Surgical History:  Procedure Laterality Date  . CARDIAC CATHETERIZATION N/A 07/13/2014   Procedure: Left Heart Cath and Coronary Angiography;  Surgeon: Leonie Man, MD;  Location: [facility];  Service: Cardiovascular;  Laterality: N/A;  . CYSTECTOMY Left ~ 2014   "wrist"  . CYSTECTOMY Left ~ 2014   "2 on my foot""  . LAPAROSCOPIC APPENDECTOMY N/A 09/18/2018   Procedure: APPENDECTOMY LAPAROSCOPIC;  Surgeon: Johnathan Hausen, MD;  Location: [facility];  Service: General;  Laterality: N/A;        Home Medications    Prior to Admission medications   Medication Sig Start Date End Date Taking? Authorizing Provider  aspirin EC 81 MG tablet Take 81 mg by mouth daily.   Yes [provider]  atorvastatin (LIPITOR) 40 MG tablet Take 1 tablet (40 mg total) by mouth daily. 10/02/18  Yes Daune Perch, NP  pantoprazole (PROTONIX) 40 MG tablet Take 1 tablet (40 mg total) by mouth daily. 10/02/18  Yes Daune Perch, NP  ciprofloxacin (CIPRO) 500 MG tablet Take 1 tablet (500 mg total) by mouth every 12 (twelve) hours for 7 days. 11/26/18 12/03/18  Cheek, Comer Locket, PA-C  meloxicam (MOBIC) 15 MG tablet Take 1 tablet (15 mg total) by mouth daily. 11/26/18   Cheek, Comer Locket, PA-C  metroNIDAZOLE (FLAGYL) 500 MG tablet Take 1 tablet (  500 mg total) by mouth 3 (three) times daily for 7 days. 11/26/18 12/03/18  Renee Harderheek, Caroline B, PA-C    Family History Family History  Problem Relation Age of Onset  . AAA (abdominal aortic aneurysm) Mother   . Hypertension Mother   . Diabetes Mother   . Heart failure Father   . Stroke Father     Social History Social History   Tobacco Use  . Smoking status: Former Smoker    Packs/day: 0.50    Years: 0.00    Pack years: 0.00    Types: Cigarettes  . Smokeless tobacco: Never Used  Substance Use  Topics  . Alcohol use: No  . Drug use: No     Allergies   Penicillins   Review of Systems Review of Systems  Constitutional: Negative for chills and fever.  Respiratory: Negative for shortness of breath.   Cardiovascular: Negative for leg swelling.  Gastrointestinal: Positive for abdominal distention, abdominal pain and vomiting. Negative for constipation, diarrhea and nausea.  Genitourinary: Positive for difficulty urinating.     Physical Exam Updated Vital Signs BP 109/76   Pulse 70   Temp 98.4 F (36.9 C)   Resp 16   Ht 5\' 10"  (1.778 m)   Wt 117.9 kg   SpO2 100%   BMI 37.31 kg/m   Physical Exam Vitals signs and nursing note reviewed.  Constitutional:      General: He is not in acute distress. HENT:     Head: Normocephalic.  Eyes:     General: No scleral icterus.    Conjunctiva/sclera: Conjunctivae normal.  Neck:     Musculoskeletal: Neck supple.  Cardiovascular:     Rate and Rhythm: Normal rate and regular rhythm.     Pulses: Normal pulses.     Heart sounds: Normal heart sounds. No murmur. No friction rub. No gallop.   Pulmonary:     Effort: Pulmonary effort is normal.     Breath sounds: Normal breath sounds.  Abdominal:     General: Abdomen is flat. There is distension.     Palpations: Abdomen is soft.     Tenderness: There is abdominal tenderness. There is rebound.     Comments: RLQ tenderness at McBurney's point. Positive rebound tenderness. Positive Rovsing sign. Laproscopic incisions appear to be well healed with no drainage or erythema. Hypoactive bowel sounds.  Musculoskeletal:     Comments: Able to move all 4 extremities without difficulty. No lower extremity edema  Skin:    General: Skin is warm and dry.  Neurological:     General: No focal deficit present.      ED Treatments / Results  Labs (all labs ordered are listed, but only abnormal results are displayed) Labs Reviewed  COMPREHENSIVE METABOLIC PANEL - Abnormal; Notable for the  following components:      Result Value   AST 54 (*)    ALT 97 (*)    All other components within normal limits  CBC - Abnormal; Notable for the following components:   WBC 12.4 (*)    All other components within normal limits  URINALYSIS, ROUTINE W REFLEX MICROSCOPIC - Abnormal; Notable for the following components:   Ketones, ur 5 (*)    All other components within normal limits  LIPASE, BLOOD    EKG None  Radiology Ct Abdomen Pelvis W Contrast  Result Date: 11/26/2018 CLINICAL DATA:  Right upper and right lower quadrant abdominal pain beginning today. History of appendectomy in 09/2018 with subsequent abscess. EXAM:  CT ABDOMEN AND PELVIS WITH CONTRAST TECHNIQUE: Multidetector CT imaging of the abdomen and pelvis was performed using the standard protocol following bolus administration of intravenous contrast. CONTRAST:  OMNIPAQUE IOHEXOL 300 MG/ML  SOLN COMPARISON:  10/14/2018 FINDINGS: Lower chest: Clear lung bases. Hepatobiliary: Mildly diminished attenuation of the liver diffusely which may reflect mild steatosis. No focal liver abnormality. Unremarkable gallbladder. No biliary dilatation. Pancreas: Unremarkable. Spleen: Unremarkable. Adrenals/Urinary Tract: Unremarkable adrenal glands. No evidence of renal mass, calculi, or hydronephrosis. Under distended bladder. Stomach/Bowel: There is a small sliding hiatal hernia with small volume fluid partially visualized in the distal esophagus which may reflect reflux. There is no evidence of bowel obstruction. Sequelae of appendectomy are again identified. There is persistent inflammation in the right lower quadrant medial and inferior to the cecum with multiple small foci of extraluminal gas within the area of inflammation, however the 4 cm fluid collection on the prior CT has resolved and no new fluid collection is identified. There is persistent wall thickening of the terminal ileum which is borderline dilated, measuring 3 cm in diameter.  Right-sided colonic diverticulosis is again noted without definite evidence of acute diverticulitis. Vascular/Lymphatic: Aortic atherosclerosis without aneurysm. No enlarged lymph nodes. Reproductive: Unremarkable prostate. Other: No significant intraperitoneal free fluid. Musculoskeletal: No acute osseous abnormality or suspicious osseous lesion. IMPRESSION: 1. Persistent inflammation in the right lower quadrant with multiple small foci of extraluminal gas, however there is no residual or new fluid collection. 2. Persistent inflammation of the terminal ileum which is mildly dilated likely reflecting focal ileus. 3. Right-sided colonic diverticulosis. 4. Aortic Atherosclerosis (ICD10-I70.0). Electronically Signed   By: Sebastian Ache M.D.   On: 11/26/2018 16:27    Procedures Procedures (including critical care time)  Medications Ordered in ED Medications  sodium chloride flush (NS) 0.9 % injection 3 mL (3 mLs Intravenous Given 11/26/18 1306)  iohexol (OMNIPAQUE) 300 MG/ML solution 100 mL (100 mLs Intravenous Contrast Given 11/26/18 1604)  alum & mag hydroxide-simeth (MAALOX/MYLANTA) 200-200-20 MG/5ML suspension 30 mL (30 mLs Oral Given 11/26/18 1557)  HYDROmorphone (DILAUDID) injection 1 mg (1 mg Intravenous Given 11/26/18 1741)     Initial Impression / Assessment and Plan / ED Course  I have reviewed the triage vital signs and the nursing notes.  Pertinent labs & imaging results that were available during my care of the patient were reviewed by me and considered in my medical decision making (see chart for details).  Clinical Course as of Nov 26 2215  Wed Nov 26, 2018  1721 Spoke to Dr. Maisie Fus with general surgery who recommends outpatient treatment with Augmentin and follow-up in 1 week in office. She also recommends low residue diet.    [CC]    Clinical Course User Index [CC] Renee Harder, PA-C      38 year old presents to ED with RLQ abdominal pain. Patient had a laparoscopic  appendectomy on 09/18/2018 complicated by perforation and abscess formation. Patient was also admitted on 10/6 due to 4 cm intra-abdominal abscess which was not amendable to drainage and was treated with IV antibiotics. Vitals reviewed. Patient is afebrile, not tachycardic, and not hypoxic. Patient in no acute distress and appears to be in significant amount of pain in bed. Abdomen soft, mild distention? With RLQ tenderness and rebound. Bowel sounds hypoactive. Will order CT and routine labs.  UA without signs of infection. Mild amount of ketones, likely due to dehydration and decreased appetite. CBC with leukocytosis at 12.4. CMP with elevated AST and ALT  which appears to be new for patient. Lipase normal. CT scan personally reviewed which shows persistent inflammation in RLQ with extraluminal gas and persistent inflammation in terminal ileum. Will consult general surgery.   4:55 PM reevaluated patient. He states his pain as worsened after pain medication. Abdomen soft, mildly distended with diffuse tenderness to palpation, most significant in RLQ.   Patient's pain controlled more after further pain medication. Will discharge home Flagyl and Ciprofloxacin given patient has an anaphylaxis reaction to PCN. Patient instructed to maintain a residue diet per general surgery. Patient advised to call general surgery office to schedule an appointment for follow-up within the next week. Strict ED precautions discussed with patient. Patient states understanding and agrees to plan. Patient discharged home in no acute distress and vitals within normal limits.   Discussed case with Dr. Effie Shy who evaluated patient and agrees with assessment and plan.  Final Clinical Impressions(s) / ED Diagnoses   Final diagnoses:  RLQ abdominal pain    ED Discharge Orders         Ordered    ciprofloxacin (CIPRO) 500 MG tablet  Every 12 hours,   Status:  Discontinued     11/26/18 1840    metroNIDAZOLE (FLAGYL) 500 MG tablet   3 times daily,   Status:  Discontinued     11/26/18 1840    meloxicam (MOBIC) 15 MG tablet  Daily,   Status:  Discontinued     11/26/18 1840    meloxicam (MOBIC) 15 MG tablet  Daily     11/26/18 1841    metroNIDAZOLE (FLAGYL) 500 MG tablet  3 times daily     11/26/18 1841    ciprofloxacin (CIPRO) 500 MG tablet  Every 12 hours     11/26/18 1845           Lorelle Formosa 11/26/18 2219    Mancel Bale, MD 11/27/18 [id number]

## 2018-11-26 NOTE — ED Notes (Signed)
Caroline PA at bedside.  

## 2018-11-26 NOTE — ED Provider Notes (Signed)
  Face-to-face evaluation   History: He is here for evaluation of right upper quadrant abdominal pain which started just prior to arrival here.  Similar site of pain, recently when he had an intra-abdominal abscess, following perforated appendicitis.  Physical exam: Obese, alert cooperative.  He is nontoxic in appearance.  Abdomen has mild right and suprapubic tenderness.  There is no rebound tenderness.  Medical screening examination/treatment/procedure(s) were conducted as a shared visit with non-physician practitioner(s) and myself.  I personally evaluated the patient during the encounter    Daleen Bo, MD 11/27/18 [phone]

## 2018-11-26 NOTE — ED Notes (Signed)
Pt made aware need urine sample 

## 2018-11-26 NOTE — ED Notes (Signed)
Pt transported to CT ?

## 2018-11-26 NOTE — Discharge Instructions (Signed)
I am sending you home with two antibiotics. Take as prescribed and finish all antibiotics. I am also sending you home with some pain medication. You can take this medication once a day. Do not mix with other medications. If you pain persists, call the surgeons office to schedule an appointment. Follow-up with them in office in 1 week. Return to the ER for new or worsening symptoms.

## 2019-02-05 ENCOUNTER — Other Ambulatory Visit: Payer: Self-pay

## 2019-02-05 ENCOUNTER — Ambulatory Visit (INDEPENDENT_AMBULATORY_CARE_PROVIDER_SITE_OTHER): Payer: 59 | Admitting: Family Medicine

## 2019-02-05 DIAGNOSIS — I2510 Atherosclerotic heart disease of native coronary artery without angina pectoris: Secondary | ICD-10-CM | POA: Diagnosis not present

## 2019-02-05 DIAGNOSIS — R7303 Prediabetes: Secondary | ICD-10-CM

## 2019-02-05 NOTE — Progress Notes (Signed)
Patient verified DOB Patient has eaten today. Patient has taken medication today. Patient denies pain at this time. 

## 2019-02-05 NOTE — Progress Notes (Signed)
Virtual Visit via Telephone Note  I connected with Bernestine Amass, on 02/05/2019 at 8:49 AM by telephone due to the COVID-19 pandemic and verified that I am speaking with the correct person using two identifiers.   Consent: I discussed the limitations, risks, security and privacy concerns of performing an evaluation and management service by telephone and the availability of in person appointments. I also discussed with the patient that there may be a patient responsible charge related to this service. The patient expressed understanding and agreed to proceed.   Location of Patient: Work  Government social research officer of Provider: Clinic   Persons participating in Telemedicine visit: Thor Nannini [place] -CMA Dr. Alvis Lemmings     History of Present Illness: 39 year old male with CAD, Prediabetes (A1c 6.0), abdominal abscess secondary to perforated appendicitis with placement of abdominal drain, COVID-19 infection in 10/2018 here for a follow up visit.  His abscess has healed now and he is back at work; released by the [employer]. Moving his bowels well and   With regards to his Pre Diabetes he is working on a diabetic diet but not on medications. He gets a lot of exercise at work. He has no chest pain, dyspnea and has been on Lipitor for hyperlipidemia and CAD. Last seen by Cardiology in 09/2018 with recommendations to follow up in 6 months Past Medical History:  Diagnosis Date  . Acute appendicitis 09/18/2018  . Acute diverticulitis 08/30/2015  . CAD (coronary artery disease)    Mild with 40% LAD stenosis by cath in 2016  . Chest pain   . Current smoker 07/13/2014  . Diverticulitis   . Epididymitis   . Family history of adverse reaction to anesthesia    "dad had allergic reaction to anesthesia"   . GERD (gastroesophageal reflux disease)   . Obese 07/13/2014  . Ruptured appendicitis 09/18/2018   Allergies  Allergen Reactions  . Penicillins Anaphylaxis    Has patient had a PCN reaction causing  immediate rash, facial/tongue/throat swelling, SOB or lightheadedness with hypotension:Yes Has patient had a PCN reaction causing severe rash involving mucus membranes or skin necrosis:unsure Has patient had a PCN reaction that required hospitalization:Yes Has patient had a PCN reaction occurring within the last 10 years:Yes If all of the above answers are "NO", then may proceed with Cephalosporin use.      Current Outpatient Medications on File Prior to Visit  Medication Sig Dispense Refill  . aspirin EC 81 MG tablet Take 81 mg by mouth daily.    Marland Kitchen atorvastatin (LIPITOR) 40 MG tablet Take 1 tablet (40 mg total) by mouth daily. 90 tablet 3  . meloxicam (MOBIC) 15 MG tablet Take 1 tablet (15 mg total) by mouth daily. 10 tablet 0  . pantoprazole (PROTONIX) 40 MG tablet Take 1 tablet (40 mg total) by mouth daily. 90 tablet 3   No current facility-administered medications on file prior to visit.    Observations/Objective: Alert, awake, oriented x3 Not in acute distress  Lab Results  Component Value Date   HGBA1C 6.0 (H) 10/02/2018    Assessment and Plan:  1. Prediabetes Controlled with A1c of 6.0 Continue diet control A1c is due at next visit  2. Coronary artery disease involving native coronary artery of native heart without angina pectoris No angina Risk factor modification Continue Lipitor Followed by Cardiology   Follow Up Instructions: Return in about 2 months (around 04/05/2019) for chronic disease management.    I discussed the assessment and treatment plan with the  patient. The patient was provided an opportunity to ask questions and all were answered. The patient agreed with the plan and demonstrated an understanding of the instructions.   The patient was advised to call back or seek an in-person evaluation if the symptoms worsen or if the condition fails to improve as anticipated.     I provided 11 minutes total of non-face-to-face time during this encounter  including median intraservice time, reviewing previous notes, investigations, ordering medications, medical decision making, coordinating care and patient verbalized understanding at the end of the visit.     Charlott Rakes, MD, FAAFP. [facility] and [place], [place]   02/05/2019, 8:49 AM

## 2019-03-26 ENCOUNTER — Telehealth: Payer: Self-pay

## 2019-03-26 NOTE — Telephone Encounter (Signed)
Called patient to do their pre-visit COVID screening.  Call went to voicemail. Unable to do prescreening.  

## 2019-03-30 ENCOUNTER — Other Ambulatory Visit: Payer: Self-pay

## 2019-03-30 ENCOUNTER — Ambulatory Visit: Payer: 59 | Admitting: Internal Medicine

## 2019-03-30 ENCOUNTER — Encounter (HOSPITAL_COMMUNITY): Payer: Self-pay

## 2019-03-30 DIAGNOSIS — K567 Ileus, unspecified: Secondary | ICD-10-CM | POA: Diagnosis present

## 2019-03-30 DIAGNOSIS — I2510 Atherosclerotic heart disease of native coronary artery without angina pectoris: Secondary | ICD-10-CM | POA: Diagnosis present

## 2019-03-30 DIAGNOSIS — Z87891 Personal history of nicotine dependence: Secondary | ICD-10-CM

## 2019-03-30 DIAGNOSIS — K5792 Diverticulitis of intestine, part unspecified, without perforation or abscess without bleeding: Secondary | ICD-10-CM | POA: Diagnosis not present

## 2019-03-30 DIAGNOSIS — K219 Gastro-esophageal reflux disease without esophagitis: Secondary | ICD-10-CM | POA: Diagnosis present

## 2019-03-30 DIAGNOSIS — Z79899 Other long term (current) drug therapy: Secondary | ICD-10-CM

## 2019-03-30 DIAGNOSIS — K5720 Diverticulitis of large intestine with perforation and abscess without bleeding: Principal | ICD-10-CM | POA: Diagnosis present

## 2019-03-30 DIAGNOSIS — Z823 Family history of stroke: Secondary | ICD-10-CM

## 2019-03-30 DIAGNOSIS — Z20822 Contact with and (suspected) exposure to covid-19: Secondary | ICD-10-CM | POA: Diagnosis present

## 2019-03-30 DIAGNOSIS — Z7982 Long term (current) use of aspirin: Secondary | ICD-10-CM

## 2019-03-30 DIAGNOSIS — Z6837 Body mass index (BMI) 37.0-37.9, adult: Secondary | ICD-10-CM

## 2019-03-30 DIAGNOSIS — E669 Obesity, unspecified: Secondary | ICD-10-CM | POA: Diagnosis present

## 2019-03-30 DIAGNOSIS — Z880 Allergy status to penicillin: Secondary | ICD-10-CM

## 2019-03-30 DIAGNOSIS — Z833 Family history of diabetes mellitus: Secondary | ICD-10-CM

## 2019-03-30 DIAGNOSIS — Z8249 Family history of ischemic heart disease and other diseases of the circulatory system: Secondary | ICD-10-CM

## 2019-03-30 LAB — COMPREHENSIVE METABOLIC PANEL
ALT: 75 U/L — ABNORMAL HIGH (ref 0–44)
AST: 32 U/L (ref 15–41)
Albumin: 4.1 g/dL (ref 3.5–5.0)
Alkaline Phosphatase: 56 U/L (ref 38–126)
Anion gap: 9 (ref 5–15)
BUN: 13 mg/dL (ref 6–20)
CO2: 28 mmol/L (ref 22–32)
Calcium: 9 mg/dL (ref 8.9–10.3)
Chloride: 101 mmol/L (ref 98–111)
Creatinine, Ser: 1 mg/dL (ref 0.61–1.24)
GFR calc Af Amer: >60 mL/min (ref >60)
GFR calc non Af Amer: >60 mL/min (ref >60)
Glucose, Bld: 121 mg/dL — ABNORMAL HIGH (ref 70–99)
Potassium: 3.8 mmol/L (ref 3.5–5.1)
Sodium: 138 mmol/L (ref 135–145)
Total Bilirubin: 1 mg/dL (ref 0.3–1.2)
Total Protein: 7.6 g/dL (ref 6.5–8.1)

## 2019-03-30 LAB — CBC
HCT: 45.2 % (ref 39.0–52.0)
Hemoglobin: 15.2 g/dL (ref 13.0–17.0)
MCH: 30.8 pg (ref 26.0–34.0)
MCHC: 33.6 g/dL (ref 30.0–36.0)
MCV: 91.5 fL (ref 80.0–100.0)
Platelets: 274 10*3/uL (ref 150–400)
RBC: 4.94 MIL/uL (ref 4.22–5.81)
RDW: 13.2 % (ref 11.5–15.5)
WBC: 10.4 10*3/uL (ref 4.0–10.5)
nRBC: 0 % (ref 0.0–0.2)

## 2019-03-30 LAB — LIPASE, BLOOD: Lipase: 29 U/L (ref 11–51)

## 2019-03-30 MED ORDER — SODIUM CHLORIDE 0.9% FLUSH: 3.0000 mL | Freq: Once | INTRAVENOUS | Status: DC

## 2019-03-30 NOTE — ED Triage Notes (Signed)
Arrived POV. Patient reports constant mid abdominal pain that radiates to right side started on Sunday. Patient reports the pain is so bad, he cannot lay down to rest.

## 2019-03-30 NOTE — ED Notes (Signed)
Provided pt labeled urine specimen cup for collection per MD order. ENMiles 

## 2019-03-31 ENCOUNTER — Inpatient Hospital Stay (HOSPITAL_COMMUNITY)
Admission: EM | Admit: 2019-03-31 | Discharge: 2019-04-04 | DRG: 392 | Disposition: A | Discharge status: Home / Self Care | Payer: 59 | Attending: Surgery | Admitting: Surgery

## 2019-03-31 ENCOUNTER — Encounter (HOSPITAL_COMMUNITY): Payer: Self-pay

## 2019-03-31 ENCOUNTER — Emergency Department (HOSPITAL_COMMUNITY): Payer: 59

## 2019-03-31 DIAGNOSIS — Z79899 Other long term (current) drug therapy: Secondary | ICD-10-CM | POA: Diagnosis not present

## 2019-03-31 DIAGNOSIS — K219 Gastro-esophageal reflux disease without esophagitis: Secondary | ICD-10-CM | POA: Diagnosis present

## 2019-03-31 DIAGNOSIS — Z833 Family history of diabetes mellitus: Secondary | ICD-10-CM | POA: Diagnosis not present

## 2019-03-31 DIAGNOSIS — K5900 Constipation, unspecified: Secondary | ICD-10-CM

## 2019-03-31 DIAGNOSIS — Z20822 Contact with and (suspected) exposure to covid-19: Secondary | ICD-10-CM | POA: Diagnosis present

## 2019-03-31 DIAGNOSIS — Z880 Allergy status to penicillin: Secondary | ICD-10-CM | POA: Diagnosis not present

## 2019-03-31 DIAGNOSIS — I2510 Atherosclerotic heart disease of native coronary artery without angina pectoris: Secondary | ICD-10-CM | POA: Diagnosis present

## 2019-03-31 DIAGNOSIS — Z8249 Family history of ischemic heart disease and other diseases of the circulatory system: Secondary | ICD-10-CM | POA: Diagnosis not present

## 2019-03-31 DIAGNOSIS — K567 Ileus, unspecified: Secondary | ICD-10-CM | POA: Diagnosis present

## 2019-03-31 DIAGNOSIS — K5720 Diverticulitis of large intestine with perforation and abscess without bleeding: Secondary | ICD-10-CM | POA: Diagnosis present

## 2019-03-31 DIAGNOSIS — K5792 Diverticulitis of intestine, part unspecified, without perforation or abscess without bleeding: Secondary | ICD-10-CM | POA: Diagnosis present

## 2019-03-31 DIAGNOSIS — Z87891 Personal history of nicotine dependence: Secondary | ICD-10-CM | POA: Diagnosis not present

## 2019-03-31 DIAGNOSIS — Z6837 Body mass index (BMI) 37.0-37.9, adult: Secondary | ICD-10-CM | POA: Diagnosis not present

## 2019-03-31 DIAGNOSIS — Z7982 Long term (current) use of aspirin: Secondary | ICD-10-CM | POA: Diagnosis not present

## 2019-03-31 DIAGNOSIS — Z823 Family history of stroke: Secondary | ICD-10-CM | POA: Diagnosis not present

## 2019-03-31 DIAGNOSIS — E669 Obesity, unspecified: Secondary | ICD-10-CM | POA: Diagnosis present

## 2019-03-31 LAB — URINALYSIS, ROUTINE W REFLEX MICROSCOPIC
Bilirubin Urine: NEGATIVE
Glucose, UA: NEGATIVE mg/dL
Hgb urine dipstick: NEGATIVE
Ketones, ur: NEGATIVE mg/dL
Leukocytes,Ua: NEGATIVE
Nitrite: NEGATIVE
Protein, ur: NEGATIVE mg/dL
Specific Gravity, Urine: >1.046 — ABNORMAL HIGH (ref 1.005–1.030)
pH: 6 (ref 5.0–8.0)

## 2019-03-31 LAB — CBC
HCT: 42.8 % (ref 39.0–52.0)
Hemoglobin: 14.1 g/dL (ref 13.0–17.0)
MCH: 30.3 pg (ref 26.0–34.0)
MCHC: 32.9 g/dL (ref 30.0–36.0)
MCV: 92 fL (ref 80.0–100.0)
Platelets: 255 10*3/uL (ref 150–400)
RBC: 4.65 MIL/uL (ref 4.22–5.81)
RDW: 13.3 % (ref 11.5–15.5)
WBC: 7.6 10*3/uL (ref 4.0–10.5)
nRBC: 0 % (ref 0.0–0.2)

## 2019-03-31 LAB — SARS CORONAVIRUS 2 (TAT 6-24 HRS): SARS Coronavirus 2: NEGATIVE

## 2019-03-31 IMAGING — CT CT ABD-PELV W/ CM
2 of 4 series · 16 of 46 positions shown, 18 images · IV contrast (OMNIPAQUE)
Comparison: Multiple priors, most recent [DATE]

CLINICAL DATA: Right lower quadrant abdominal pain, question of
diverticulitis

EXAM:
CT ABDOMEN AND PELVIS WITH CONTRAST
TECHNIQUE: Multidetector CT imaging of the abdomen and pelvis was performed
using the standard protocol following bolus administration of
intravenous contrast.
CONTRAST:  100mL OMNIPAQUE IOHEXOL 300 MG/ML  SOLN

[Series 2: axial st · axial · 0.81mm/px · z∈[-478,-68]mm · 13 of 92 slices shown, 15 images]
[im 5/92  soft-tissue]
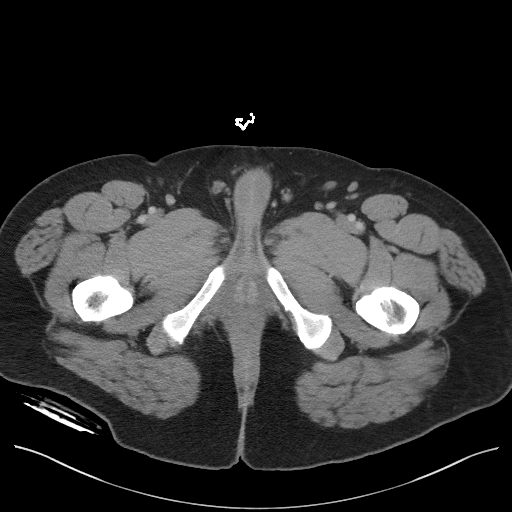
[im 5/92  bone]
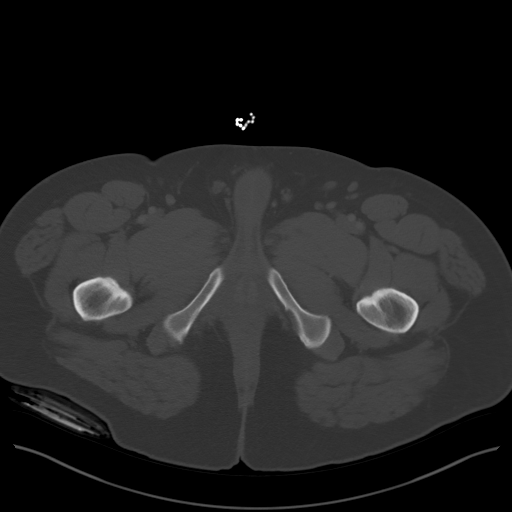
[im 13/92  soft-tissue]
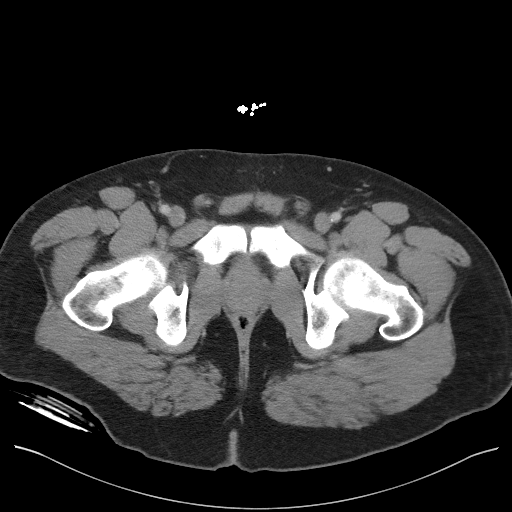
[im 21/92  soft-tissue]
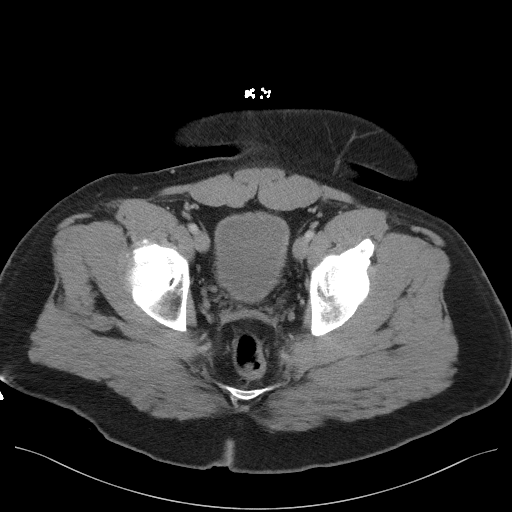
[im 25/92  soft-tissue]
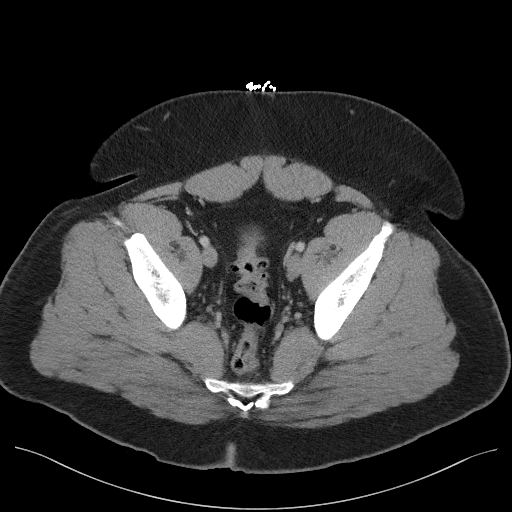
[im 34/92  soft-tissue]
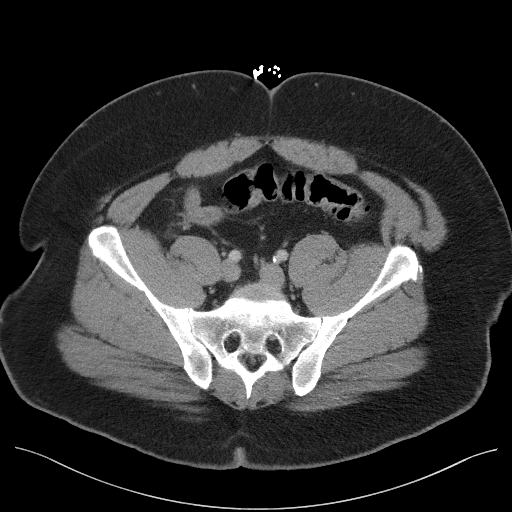
[im 38/92  soft-tissue]
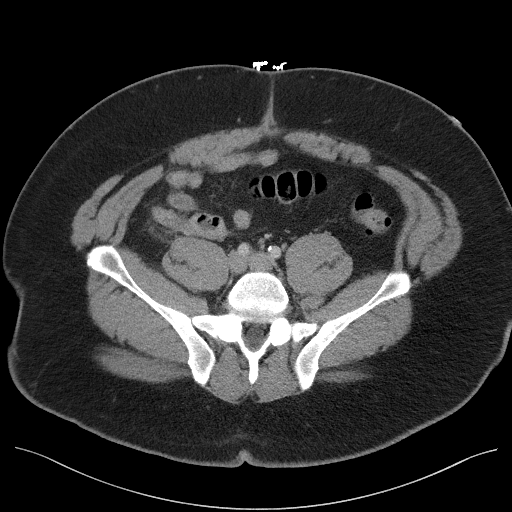
[im 46/92  soft-tissue]
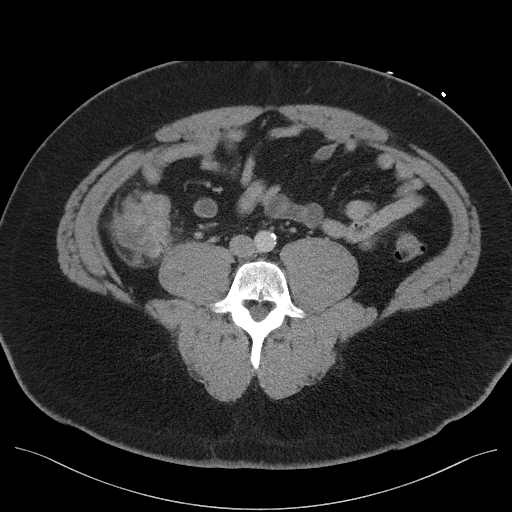
[im 54/92  soft-tissue]
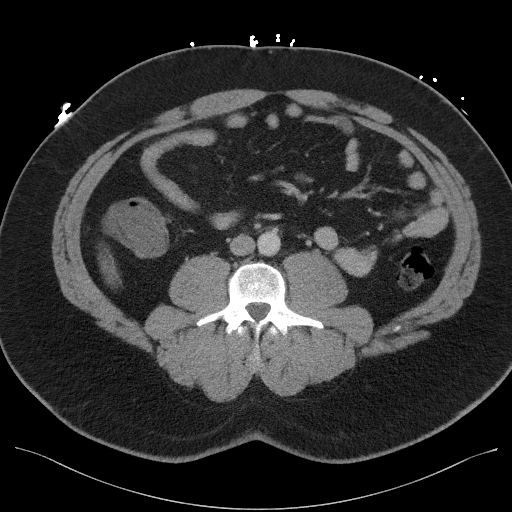
[im 58/92  soft-tissue]
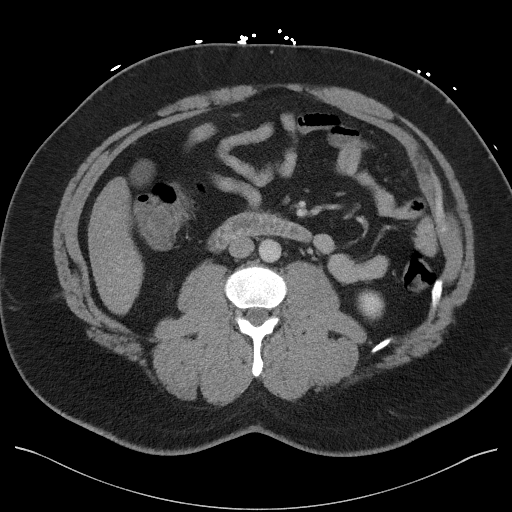
[im 58/92  bone]
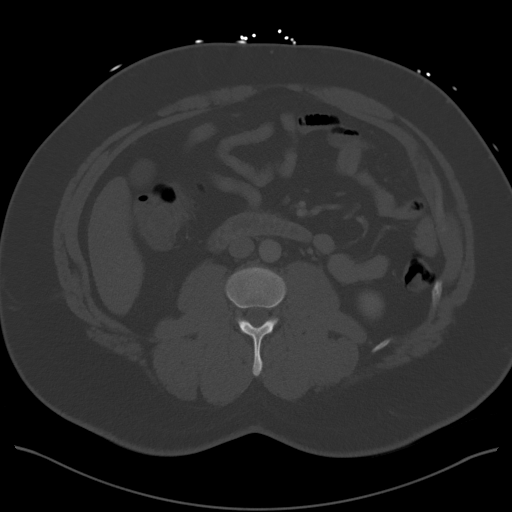
[im 67/92  soft-tissue]
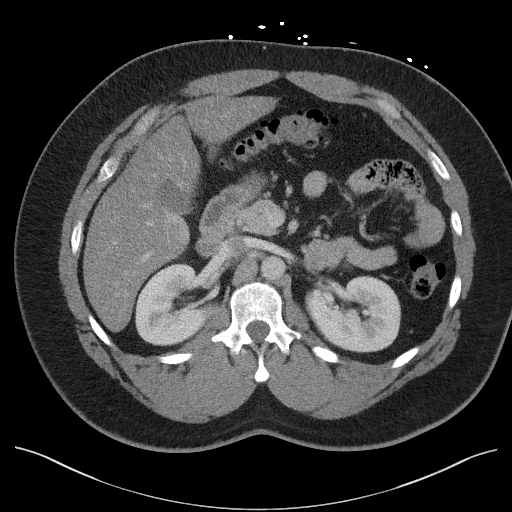
[im 71/92  soft-tissue]
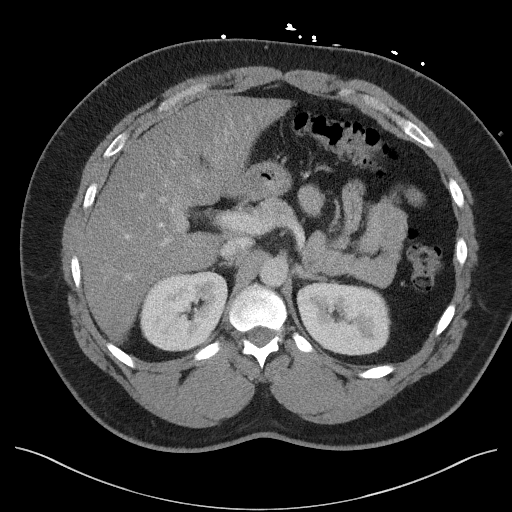
[im 79/92  soft-tissue]
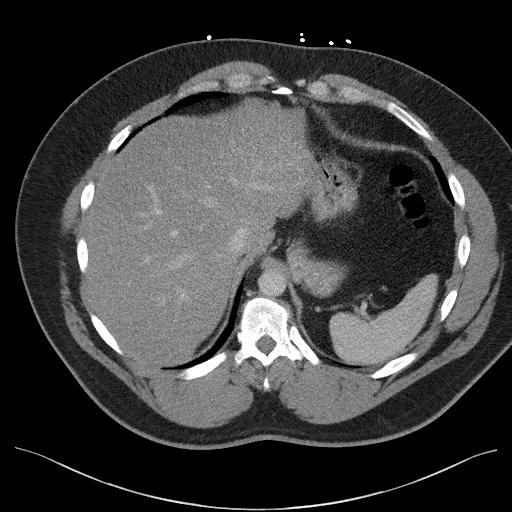
[im 87/92  soft-tissue]
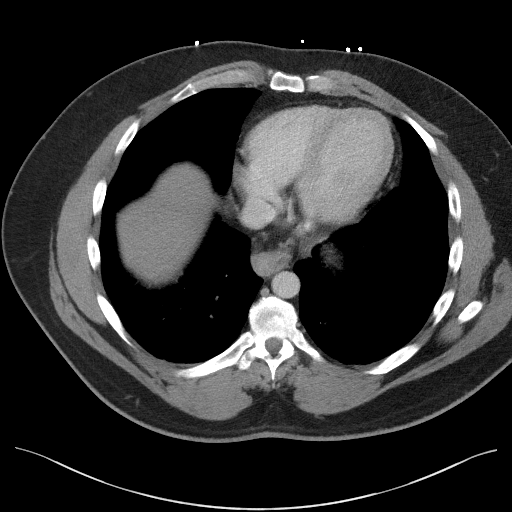

[Series 5: coronal st · coronal · 0.79mm/px · 3 of 106 slices shown]
[im 36/106  soft-tissue]
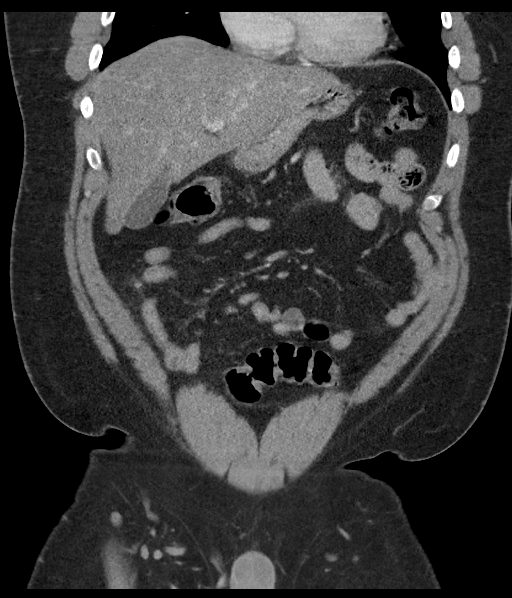
[im 47/106  soft-tissue]
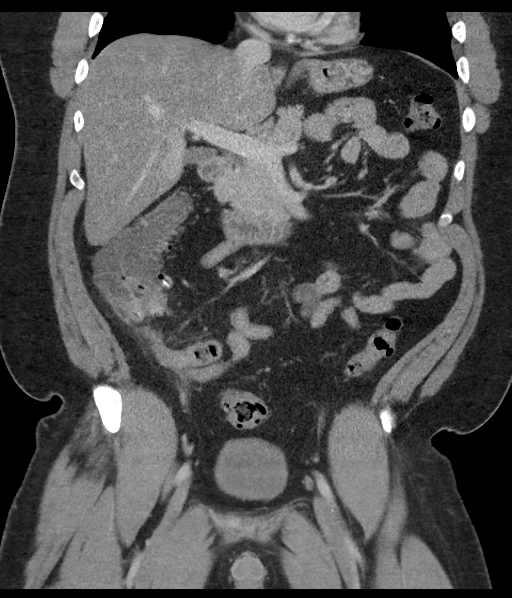
[im 59/106  soft-tissue]
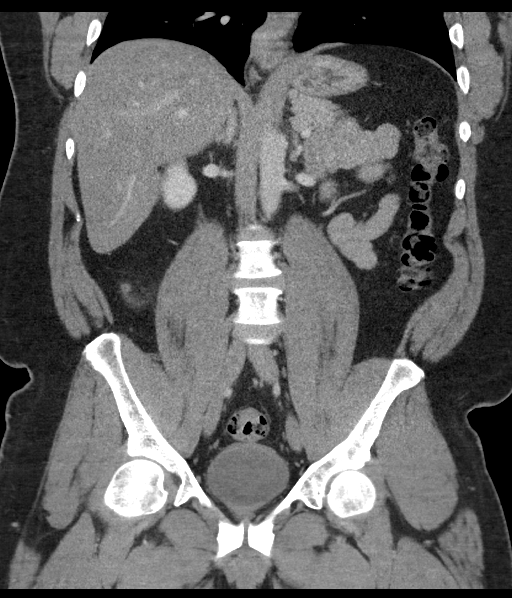

[16 of 46 positions shown; findings below may reference images not displayed]

FINDINGS: Lower chest: The visualized heart size within normal limits. No
pericardial fluid/thickening.

There is a small hiatal hernia present.

The visualized portions of the lungs are clear.

Hepatobiliary: Mildly decreased attenuation of the liver
parenchyma.The main portal vein is patent. No evidence of calcified
gallstones, gallbladder wall thickening or biliary dilatation.

Pancreas: Unremarkable. No pancreatic ductal dilatation or
surrounding inflammatory changes.

Spleen: Normal in size without focal abnormality.

Adrenals/Urinary Tract: Both adrenal glands appear normal. The
kidneys and collecting system appear normal without evidence of
urinary tract calculus or hydronephrosis. Bladder is unremarkable.

Stomach/Bowel: The stomach, small bowel are normal in appearance.
The patient is status post appendectomy. There is scattered
diverticula with diffuse wall thickening and surrounding mesenteric
fat stranding changes seen around the cecum. There is question of a
small foci of free air seen adjacent to the distal cecal pole and
surgical clips in the right lower quadrant, series 2, image 49. No
loculated fluid collection however is noted. There is also mild
diffuse wall thickening seen at the terminal ileum. The remainder of
the colon appears to be unremarkable.

Vascular/Lymphatic: There are no enlarged mesenteric,
retroperitoneal, or pelvic lymph nodes. Scattered atherosclerosis is
seen in the distal abdominal aorta and iliac vasculature.

Reproductive: The prostate is unremarkable.

Other: No evidence of abdominal wall mass or hernia.

Musculoskeletal: No acute or significant osseous findings.
IMPRESSION: 1. Cecal colonic acute diverticulitis with a small foci of possible
adjacent free air. No loculated fluid collections.
2. Status post appendectomy.
3. Mild wall thickening of the terminal ileum likely from adjacent
inflammatory changes.
4.  Aortic Atherosclerosis ([1Z]-[1Z]).
5. Mild hepatic steatosis.
6. Small hiatal hernia.

## 2019-03-31 MED ORDER — ONDANSETRON HCL 4 MG/2ML IJ SOLN
4.0000 mg | Freq: Four times a day (QID) | INTRAMUSCULAR | Status: DC | PRN
  Administered 2019-03-31 (×2): 4 mg via INTRAVENOUS
  Filled 2019-03-31 (×2): qty 2

## 2019-03-31 MED ORDER — PANTOPRAZOLE SODIUM 40 MG PO TBEC
40.0000 mg | DELAYED_RELEASE_TABLET | Freq: Every day | ORAL | Status: DC
  Administered 2019-03-31 – 2019-04-03 (×4): 40 mg via ORAL
  Filled 2019-03-31 (×4): qty 1

## 2019-03-31 MED ORDER — DIPHENHYDRAMINE HCL 50 MG/ML IJ SOLN
25.0000 mg | Freq: Four times a day (QID) | INTRAMUSCULAR | Status: DC | PRN
  Administered 2019-03-31: 25 mg via INTRAVENOUS
  Filled 2019-03-31: qty 1

## 2019-03-31 MED ORDER — METRONIDAZOLE IN NACL 5-0.79 MG/ML-% IV SOLN
500.0000 mg | Freq: Three times a day (TID) | INTRAVENOUS | Status: DC
  Administered 2019-03-31 – 2019-04-03 (×9): 500 mg via INTRAVENOUS
  Filled 2019-03-31 (×9): qty 100

## 2019-03-31 MED ORDER — CIPROFLOXACIN IN D5W 400 MG/200ML IV SOLN
400.0000 mg | Freq: Two times a day (BID) | INTRAVENOUS | Status: DC
  Administered 2019-03-31 – 2019-04-02 (×6): 400 mg via INTRAVENOUS
  Filled 2019-03-31 (×6): qty 200

## 2019-03-31 MED ORDER — HYDROMORPHONE HCL 1 MG/ML IJ SOLN
1.0000 mg | INTRAMUSCULAR | Status: DC | PRN
  Administered 2019-03-31 – 2019-04-01 (×7): 1 mg via INTRAVENOUS
  Filled 2019-03-31 (×7): qty 1

## 2019-03-31 MED ORDER — ENOXAPARIN SODIUM 40 MG/0.4ML ~~LOC~~ SOLN
40.0000 mg | SUBCUTANEOUS | Status: DC
  Administered 2019-03-31 – 2019-04-03 (×4): 40 mg via SUBCUTANEOUS
  Filled 2019-03-31 (×4): qty 0.4

## 2019-03-31 MED ORDER — ALUM & MAG HYDROXIDE-SIMETH 200-200-20 MG/5ML PO SUSP
30.0000 mL | ORAL | Status: DC | PRN
  Administered 2019-03-31 – 2019-04-01 (×3): 30 mL via ORAL
  Filled 2019-03-31 (×3): qty 30

## 2019-03-31 MED ORDER — CIPROFLOXACIN IN D5W 400 MG/200ML IV SOLN
400.0000 mg | Freq: Once | INTRAVENOUS | Status: AC
  Administered 2019-03-31: 400 mg via INTRAVENOUS
  Filled 2019-03-31: qty 200

## 2019-03-31 MED ORDER — DOCUSATE SODIUM 100 MG PO CAPS
100.0000 mg | ORAL_CAPSULE | Freq: Two times a day (BID) | ORAL | Status: DC
  Administered 2019-03-31 – 2019-04-03 (×8): 100 mg via ORAL
  Filled 2019-03-31 (×8): qty 1

## 2019-03-31 MED ORDER — POTASSIUM CHLORIDE IN NACL 20-0.9 MEQ/L-% IV SOLN
INTRAVENOUS | Status: DC
  Filled 2019-03-31 (×4): qty 1000

## 2019-03-31 MED ORDER — MORPHINE SULFATE (PF) 4 MG/ML IV SOLN
4.0000 mg | Freq: Once | INTRAVENOUS | Status: AC
  Administered 2019-03-31: 4 mg via INTRAVENOUS
  Filled 2019-03-31: qty 1

## 2019-03-31 MED ORDER — METRONIDAZOLE IN NACL 5-0.79 MG/ML-% IV SOLN
500.0000 mg | Freq: Once | INTRAVENOUS | Status: AC
  Administered 2019-03-31: 500 mg via INTRAVENOUS
  Filled 2019-03-31: qty 100

## 2019-03-31 MED ORDER — DIPHENHYDRAMINE HCL 25 MG PO CAPS: 25.0000 mg | ORAL_CAPSULE | Freq: Four times a day (QID) | ORAL | Status: DC | PRN

## 2019-03-31 MED ORDER — IOHEXOL 300 MG/ML  SOLN
100.0000 mL | Freq: Once | INTRAMUSCULAR | Status: AC | PRN
  Administered 2019-03-31: 100 mL via INTRAVENOUS

## 2019-03-31 MED ORDER — ONDANSETRON 4 MG PO TBDP
4.0000 mg | ORAL_TABLET | Freq: Four times a day (QID) | ORAL | Status: DC | PRN
  Administered 2019-03-31: 4 mg via ORAL
  Filled 2019-03-31: qty 1

## 2019-03-31 NOTE — ED Provider Notes (Addendum)
[facility]-EMERGENCY DEPT Provider Note   CSN: [id number] Arrival date & time: 03/30/19  2029     History Chief Complaint  Patient presents with  . Abdominal Pain    Kyle Silva is a 39 y.o. male.  The history is provided by the patient and medical records.  Abdominal Pain   39 year old male with history of coronary artery disease, diverticulitis, GERD, presenting to the ED with abdominal pain.  States this began yesterday evening, mostly localized to mid abdomen but radiates to the right.  He denies any associated nausea, vomiting, or diarrhea.  Pain is worse with attempted movement and changing position.  He denies any urinary symptoms or hematuria.  He does report history of diverticulitis in the past, states this feels somewhat similar but is also a little different.  He has not had any medications PTA.  Prior abdominal surgeries include appendectomy with unfortunate abscess formation and requiring IR drainage last year.  Past Medical History:  Diagnosis Date  . Acute appendicitis 09/18/2018  . Acute diverticulitis 08/30/2015  . CAD (coronary artery disease)    Mild with 40% LAD stenosis by cath in 2016  . Chest pain   . Current smoker 07/13/2014  . Diverticulitis   . Epididymitis   . Family history of adverse reaction to anesthesia    "dad had allergic reaction to anesthesia"   . GERD (gastroesophageal reflux disease)   . Obese 07/13/2014  . Ruptured appendicitis 09/18/2018    Patient Active Problem List   Diagnosis Date Noted  . Obesity, Class I, BMI 30.0-34.9 (see actual BMI) 10/15/2018  . COVID-19 virus infection 10/15/2018  . Intra-abdominal abscess (HCC) 10/14/2018  . Acute appendicitis 09/18/2018  . Ruptured appendicitis 09/18/2018  . Acute diverticulitis 08/30/2015  . Current smoker 07/13/2014  . Family history of premature CAD 07/13/2014  . Obese 07/13/2014  . Unstable angina (HCC) 07/13/2014  . Chest pain     Past Surgical History:    Procedure Laterality Date  . CARDIAC CATHETERIZATION N/A 07/13/2014   Procedure: Left Heart Cath and Coronary Angiography;  Surgeon: Marykay Lex, MD;  Location: [facility] INVASIVE CV LAB;  Service: Cardiovascular;  Laterality: N/A;  . CYSTECTOMY Left ~ 2014   "wrist"  . CYSTECTOMY Left ~ 2014   "2 on my foot""  . LAPAROSCOPIC APPENDECTOMY N/A 09/18/2018   Procedure: APPENDECTOMY LAPAROSCOPIC;  Surgeon: Luretha Murphy, MD;  Location: [facility] ORS;  Service: General;  Laterality: N/A;       Family History  Problem Relation Age of Onset  . AAA (abdominal aortic aneurysm) Mother   . Hypertension Mother   . Diabetes Mother   . Heart failure Father   . Stroke Father     Social History   Tobacco Use  . Smoking status: Former Smoker    Packs/day: 0.25    Years: 0.00    Pack years: 0.00    Types: Cigarettes  . Smokeless tobacco: Never Used  Substance Use Topics  . Alcohol use: No  . Drug use: No    Home Medications Prior to Admission medications   Medication Sig Start Date End Date Taking? Authorizing Provider  aspirin EC 81 MG tablet Take 81 mg by mouth daily.    [provider]  atorvastatin (LIPITOR) 40 MG tablet Take 1 tablet (40 mg total) by mouth daily. 10/02/18   Berton Bon, NP  meloxicam (MOBIC) 15 MG tablet Take 1 tablet (15 mg total) by mouth daily. 11/26/18   Claudette Stapler  C, PA-C  pantoprazole (PROTONIX) 40 MG tablet Take 1 tablet (40 mg total) by mouth daily. 10/02/18   Daune Perch, NP    Allergies    Penicillins  Review of Systems   Review of Systems  Gastrointestinal: Positive for abdominal pain.  All other systems reviewed and are negative.   Physical Exam Updated Vital Signs BP 140/82 (BP Location: Right Arm)   Pulse 81   Temp 98.3 F (36.8 C) (Oral)   Resp 20   Ht 5\' 10"  (1.778 m)   Wt 117.9 kg   SpO2 100%   BMI 37.31 kg/m   Physical Exam Vitals and nursing note reviewed.  Constitutional:      Appearance: He is well-developed.      Comments: Appears uncomfortable  HENT:     Head: Normocephalic and atraumatic.  Eyes:     Conjunctiva/sclera: Conjunctivae normal.     Pupils: Pupils are equal, round, and reactive to light.  Cardiovascular:     Rate and Rhythm: Normal rate and regular rhythm.     Heart sounds: Normal heart sounds.  Pulmonary:     Effort: Pulmonary effort is normal.     Breath sounds: Normal breath sounds.  Abdominal:     General: Bowel sounds are normal.     Palpations: Abdomen is soft.     Tenderness: There is abdominal tenderness in the right lower quadrant and periumbilical area. There is no guarding or rebound.  Musculoskeletal:        General: Normal range of motion.     Cervical back: Normal range of motion.  Skin:    General: Skin is warm and dry.  Neurological:     Mental Status: He is alert and oriented to person, place, and time.     ED Results / Procedures / Treatments   Labs (all labs ordered are listed, but only abnormal results are displayed) Labs Reviewed  COMPREHENSIVE METABOLIC PANEL - Abnormal; Notable for the following components:      Result Value   Glucose, Bld 121 (*)    ALT 75 (*)    All other components within normal limits  URINALYSIS, ROUTINE W REFLEX MICROSCOPIC - Abnormal; Notable for the following components:   Specific Gravity, Urine >1.046 (*)    All other components within normal limits  SARS CORONAVIRUS 2 (TAT 6-24 HRS)  LIPASE, BLOOD  CBC    EKG None  Radiology CT ABDOMEN PELVIS W CONTRAST  Result Date: 03/31/2019 CLINICAL DATA:  Right lower quadrant abdominal pain, question of diverticulitis EXAM: CT ABDOMEN AND PELVIS WITH CONTRAST TECHNIQUE: Multidetector CT imaging of the abdomen and pelvis was performed using the standard protocol following bolus administration of intravenous contrast. CONTRAST:  130mL OMNIPAQUE IOHEXOL 300 MG/ML  SOLN COMPARISON:  Multiple priors, most recent November 26, 2018 FINDINGS: Lower chest: The visualized heart  size within normal limits. No pericardial fluid/thickening. There is a small hiatal hernia present. The visualized portions of the lungs are clear. Hepatobiliary: Mildly decreased attenuation of the liver parenchyma.The main portal vein is patent. No evidence of calcified gallstones, gallbladder wall thickening or biliary dilatation. Pancreas: Unremarkable. No pancreatic ductal dilatation or surrounding inflammatory changes. Spleen: Normal in size without focal abnormality. Adrenals/Urinary Tract: Both adrenal glands appear normal. The kidneys and collecting system appear normal without evidence of urinary tract calculus or hydronephrosis. Bladder is unremarkable. Stomach/Bowel: The stomach, small bowel are normal in appearance. The patient is status post appendectomy. There is scattered diverticula with diffuse wall thickening and  surrounding mesenteric fat stranding changes seen around the cecum. There is question of a small foci of free air seen adjacent to the distal cecal pole and surgical clips in the right lower quadrant, series 2, image 49. No loculated fluid collection however is noted. There is also mild diffuse wall thickening seen at the terminal ileum. The remainder of the colon appears to be unremarkable. Vascular/Lymphatic: There are no enlarged mesenteric, retroperitoneal, or pelvic lymph nodes. Scattered atherosclerosis is seen in the distal abdominal aorta and iliac vasculature. Reproductive: The prostate is unremarkable. Other: No evidence of abdominal wall mass or hernia. Musculoskeletal: No acute or significant osseous findings. IMPRESSION: 1. Cecal colonic acute diverticulitis with a small foci of possible adjacent free air. No loculated fluid collections. 2. Status post appendectomy. 3. Mild wall thickening of the terminal ileum likely from adjacent inflammatory changes. 4.  Aortic Atherosclerosis (ICD10-I70.0). 5. Mild hepatic steatosis. 6. Small hiatal hernia. Electronically Signed   By:  Jonna Clark M.D.   On: 03/31/2019 01:15    Procedures Procedures (including critical care time)  CRITICAL CARE Performed by: Garlon Hatchet   Total critical care time: 40 minutes  Critical care time was exclusive of separately billable procedures and treating other patients.  Critical care was necessary to treat or prevent imminent or life-threatening deterioration.  Critical care was time spent personally by me on the following activities: development of treatment plan with patient and/or surrogate as well as nursing, discussions with consultants, evaluation of patient's response to treatment, examination of patient, obtaining history from patient or surrogate, ordering and performing treatments and interventions, ordering and review of laboratory studies, ordering and review of radiographic studies, pulse oximetry and re-evaluation of patient's condition.   Medications Ordered in ED Medications  sodium chloride flush (NS) 0.9 % injection 3 mL (has no administration in time range)  ciprofloxacin (CIPRO) IVPB 400 mg (400 mg Intravenous New Bag/Given 03/31/19 0222)  metroNIDAZOLE (FLAGYL) IVPB 500 mg (500 mg Intravenous New Bag/Given 03/31/19 0221)  morphine 4 MG/ML injection 4 mg (4 mg Intravenous Given 03/31/19 0034)  iohexol (OMNIPAQUE) 300 MG/ML solution 100 mL (100 mLs Intravenous Contrast Given 03/31/19 0057)    ED Course  I have reviewed the triage vital signs and the nursing notes.  Pertinent labs & imaging results that were available during my care of the patient were reviewed by me and considered in my medical decision making (see chart for details).    MDM Rules/Calculators/A&P  39 year old male here with mid to right abdominal pain that began yesterday.  States symptoms have been worsening.  No fever, vomiting, or diarrhea.  On exam he is afebrile and nontoxic but he does appear uncomfortable.  Does have some tenderness in the periumbilical and right lower abdomen.  No  peritoneal signs.  Screening labs are reassuring.  Does have history of diverticulitis, confirmed for same so will obtain CT.  Morphine given for pain.  CT with findings of cecal colonic diverticulitis, also concerning for small pocket of free air adjacent to this.  No abscess seen. Given these findings, he should be admitted for close observation and monitoring.  Will discuss with general surgery.  Discussed with general surgery, Dr. Magnus Ivan-- will admit for ongoing care.  IV abx started.  COVID screen obtained and sent.  Final Clinical Impression(s) / ED Diagnoses Final diagnoses:  Diverticulitis    Rx / DC Orders ED Discharge Orders    None       Garlon Hatchet, PA-C 03/31/19  0237    Garlon Hatchet, PA-C 03/31/19 0158    Devoria Albe, MD 03/31/19 [phone]

## 2019-03-31 NOTE — H&P (Signed)
[facility] Admission Note  Kyle Silva [birth year]  [id number].    Requesting MD: Sharilyn Sites PA-C Chief Complaint/Reason for Consult: RLQ pain HPI:  Patient is a 39 year old male who presented to [facility] with abdominal pain. Patient reported pain started PM of 03/29/19. Pain is mostly in mid abdomen but radiates to RLQ. Pain worsened with lying flat and changing positions, feels better when he is sitting up and leaning forward. Associated constipation, nausea and vomiting. Denies fever, chills, chest pain, SOB, urinary symptoms. He recently had an appendectomy 09/2018 and in 10/2018 came back with RLQ abscess and R colonic diverticulosis but no active diverticulitis. Patient was back again with persistent inflammation in RLQ 11/2018. CT scan in ED yesterday showed acute cecal diverticulitis with microperforation. PMH otherwise significant for CAD, tobacco abuse, GERD, obesity. Patient does not take any blood thinning medications. No other past abdominal surgeries. Allergic to PCNs  ROS: Review of Systems  Constitutional: Negative for chills and fever.  Respiratory: Negative for shortness of breath and wheezing.   Cardiovascular: Negative for chest pain.  Gastrointestinal: Positive for abdominal pain, constipation, nausea and vomiting. Negative for blood in stool, diarrhea and melena.  Genitourinary: Negative for dysuria, frequency and urgency.  All other systems reviewed and are negative.   Family History  Problem Relation Age of Onset  . AAA (abdominal aortic aneurysm) Mother   . Hypertension Mother   . Diabetes Mother   . Heart failure Father   . Stroke Father     Past Medical History:  Diagnosis Date  . Acute appendicitis 09/18/2018  . Acute diverticulitis 08/30/2015  . CAD (coronary artery disease)    Mild with 40% LAD stenosis by cath in 2016  . Chest pain   . Current smoker 07/13/2014  . Diverticulitis   . Epididymitis   . Family history of adverse reaction to  anesthesia    "dad had allergic reaction to anesthesia"   . GERD (gastroesophageal reflux disease)   . Obese 07/13/2014  . Ruptured appendicitis 09/18/2018    Past Surgical History:  Procedure Laterality Date  . CARDIAC CATHETERIZATION N/A 07/13/2014   Procedure: Left Heart Cath and Coronary Angiography;  Surgeon: Marykay Lex, MD;  Location: [facility] INVASIVE CV LAB;  Service: Cardiovascular;  Laterality: N/A;  . CYSTECTOMY Left ~ 2014   "wrist"  . CYSTECTOMY Left ~ 2014   "2 on my foot""  . LAPAROSCOPIC APPENDECTOMY N/A 09/18/2018   Procedure: APPENDECTOMY LAPAROSCOPIC;  Surgeon: Luretha Murphy, MD;  Location: [facility] ORS;  Service: General;  Laterality: N/A;    Social History:  reports that he has quit smoking. His smoking use included cigarettes. He smoked 0.25 packs per day for 0.00 years. He has never used smokeless tobacco. He reports that he does not drink alcohol or use drugs.  Allergies:  Allergies  Allergen Reactions  . Penicillins Anaphylaxis    Has patient had a PCN reaction causing immediate rash, facial/tongue/throat swelling, SOB or lightheadedness with hypotension:Yes Has patient had a PCN reaction causing severe rash involving mucus membranes or skin necrosis:unsure Has patient had a PCN reaction that required hospitalization:Yes Has patient had a PCN reaction occurring within the last 10 years:Yes If all of the above answers are "NO", then may proceed with Cephalosporin use.      Medications Prior to Admission  Medication Sig Dispense Refill  . aspirin EC 81 MG tablet Take 81 mg by mouth daily.    Marland Kitchen atorvastatin (LIPITOR) 40 MG tablet Take 1  tablet (40 mg total) by mouth daily. 90 tablet 3  . omeprazole (PRILOSEC OTC) 20 MG tablet Take 20 mg by mouth daily.    . meloxicam (MOBIC) 15 MG tablet Take 1 tablet (15 mg total) by mouth daily. (Patient not taking: Reported on 03/31/2019) 10 tablet 0  . pantoprazole (PROTONIX) 40 MG tablet Take 1 tablet (40 mg total) by mouth  daily. (Patient not taking: Reported on 03/31/2019) 90 tablet 3    Blood pressure 124/80, pulse 71, temperature 98.1 F (36.7 C), temperature source Oral, resp. rate 18, height 5\' 10"  (1.778 m), weight 117.9 kg, SpO2 95 %. Physical Exam:  General: pleasant, WD, WN male HEENT: Sclera are noninjected.  PERRL.  Ears and nose without any masses or lesions.  Mouth is pink and moist Heart: regular, rate, and rhythm.  Normal s1,s2. No obvious murmurs, gallops, or rubs noted.  Palpable radial and pedal pulses bilaterally Lungs: CTAB, no wheezes, rhonchi, or rales noted.  Respiratory effort nonlabored Abd: soft, mildly ttp in RLQ, no peritonitis, ND, +BS, no masses, hernias, or organomegaly MS: all 4 extremities are symmetrical with no cyanosis, clubbing, or edema. Skin: warm and dry with no masses, lesions, or rashes Neuro: Cranial nerves 2-12 grossly intact, sensation grossly intact throughout Psych: A&Ox3 with an appropriate affect.   Results for orders placed or performed during the hospital encounter of 03/31/19 (from the past 48 hour(s))  Lipase, blood     Status: None   Collection Time: 03/30/19  9:33 PM  Result Value Ref Range   Lipase 29 11 - 51 U/L    Comment: Performed at [facility], [address], [place], [place]  Comprehensive metabolic panel     Status: Abnormal   Collection Time: 03/30/19  9:33 PM  Result Value Ref Range   Sodium 138 135 - 145 mmol/L   Potassium 3.8 3.5 - 5.1 mmol/L   Chloride 101 98 - 111 mmol/L   CO2 28 22 - 32 mmol/L   Glucose, Bld 121 (H) 70 - 99 mg/dL    Comment: Glucose reference range applies only to samples taken after fasting for at least 8 hours.   BUN 13 6 - 20 mg/dL   Creatinine, Ser 04/01/19 0.61 - 1.24 mg/dL   Calcium 9.0 8.9 - 0.96 mg/dL   Total Protein 7.6 6.5 - 8.1 g/dL   Albumin 4.1 3.5 - 5.0 g/dL   AST 32 15 - 41 U/L   ALT 75 (H) 0 - 44 U/L   Alkaline Phosphatase 56 38 - 126 U/L   Total Bilirubin 1.0 0.3 -  1.2 mg/dL   GFR calc non Af Amer >60 >60 mL/min   GFR calc Af Amer >60 >60 mL/min   Anion gap 9 5 - 15    Comment: Performed at [facility], [address], [place], [place]  CBC     Status: None   Collection Time: 03/30/19  9:33 PM  Result Value Ref Range   WBC 10.4 4.0 - 10.5 K/uL   RBC 4.94 4.22 - 5.81 MIL/uL   Hemoglobin 15.2 13.0 - 17.0 g/dL   HCT 04/01/19 76.5 - 46.5 %   MCV 91.5 80.0 - 100.0 fL   MCH 30.8 26.0 - 34.0 pg   MCHC 33.6 30.0 - 36.0 g/dL   RDW 03.5 46.5 - 68.1 %   Platelets 274 150 - 400 K/uL   nRBC 0.0 0.0 - 0.2 %    Comment:  Performed at [facility], [address], [place], [place] [address]  Urinalysis, Routine w reflex microscopic     Status: Abnormal   Collection Time: 03/30/19  9:33 PM  Result Value Ref Range   Color, Urine YELLOW YELLOW   APPearance CLEAR CLEAR   Specific Gravity, Urine >1.046 (H) 1.005 - 1.030   pH 6.0 5.0 - 8.0   Glucose, UA NEGATIVE NEGATIVE mg/dL   Hgb urine dipstick NEGATIVE NEGATIVE   Bilirubin Urine NEGATIVE NEGATIVE   Ketones, ur NEGATIVE NEGATIVE mg/dL   Protein, ur NEGATIVE NEGATIVE mg/dL   Nitrite NEGATIVE NEGATIVE   Leukocytes,Ua NEGATIVE NEGATIVE    Comment: Performed at [facility], [address] [place], [place] [address]  SARS CORONAVIRUS 2 (TAT 6-24 HRS) Nasopharyngeal Urine, Clean Catch     Status: None   Collection Time: 03/31/19  2:07 AM   Specimen: Urine, Clean Catch; Nasopharyngeal  Result Value Ref Range   SARS Coronavirus 2 NEGATIVE NEGATIVE    Comment: (NOTE) SARS-CoV-2 target nucleic acids are NOT DETECTED. The SARS-CoV-2 RNA is generally detectable in upper and lower respiratory specimens during the acute phase of infection. Negative results do not preclude SARS-CoV-2 infection, do not rule out co-infections with other pathogens, and should not be used as the sole basis for treatment or other patient management decisions. Negative  results must be combined with clinical observations, patient history, and epidemiological information. The expected result is Negative. Fact Sheet for Patients: HairSlick.no Fact Sheet for Healthcare Providers: quierodirigir.com This test is not yet approved or cleared by the [place] FDA and  has been authorized for detection and/or diagnosis of SARS-CoV-2 by FDA under an Emergency Use Authorization (EUA). This EUA will remain  in effect (meaning this test can be used) for the duration of the COVID-19 declaration under Section 56 4(b)(1) of the Act, 21 U.S.C. section 360bbb-3(b)(1), unless the authorization is terminated or revoked sooner. Performed at [facility], [address], [place], [place] [address]   CBC     Status: None   Collection Time: 03/31/19  7:32 AM  Result Value Ref Range   WBC 7.6 4.0 - 10.5 K/uL   RBC 4.65 4.22 - 5.81 MIL/uL   Hemoglobin 14.1 13.0 - 17.0 g/dL   HCT 32.9 51.8 - 84.1 %   MCV 92.0 80.0 - 100.0 fL   MCH 30.3 26.0 - 34.0 pg   MCHC 32.9 30.0 - 36.0 g/dL   RDW 66.0 63.0 - 16.0 %   Platelets 255 150 - 400 K/uL   nRBC 0.0 0.0 - 0.2 %    Comment: Performed at [facility], [address], [place], [place] [address]   CT ABDOMEN PELVIS W CONTRAST  Result Date: 03/31/2019 CLINICAL DATA:  Right lower quadrant abdominal pain, question of diverticulitis EXAM: CT ABDOMEN AND PELVIS WITH CONTRAST TECHNIQUE: Multidetector CT imaging of the abdomen and pelvis was performed using the standard protocol following bolus administration of intravenous contrast. CONTRAST:  OMNIPAQUE IOHEXOL 300 MG/ML  SOLN COMPARISON:  Multiple priors, most recent November 26, 2018 FINDINGS: Lower chest: The visualized heart size within normal limits. No pericardial fluid/thickening. There is a small hiatal hernia present. The visualized portions of the lungs are clear. Hepatobiliary: Mildly  decreased attenuation of the liver parenchyma.The main portal vein is patent. No evidence of calcified gallstones, gallbladder wall thickening or biliary dilatation. Pancreas: Unremarkable. No pancreatic ductal dilatation or surrounding inflammatory changes. Spleen: Normal in size  without focal abnormality. Adrenals/Urinary Tract: Both adrenal glands appear normal. The kidneys and collecting system appear normal without evidence of urinary tract calculus or hydronephrosis. Bladder is unremarkable. Stomach/Bowel: The stomach, small bowel are normal in appearance. The patient is status post appendectomy. There is scattered diverticula with diffuse wall thickening and surrounding mesenteric fat stranding changes seen around the cecum. There is question of a small foci of free air seen adjacent to the distal cecal pole and surgical clips in the right lower quadrant, series 2, image 49. No loculated fluid collection however is noted. There is also mild diffuse wall thickening seen at the terminal ileum. The remainder of the colon appears to be unremarkable. Vascular/Lymphatic: There are no enlarged mesenteric, retroperitoneal, or pelvic lymph nodes. Scattered atherosclerosis is seen in the distal abdominal aorta and iliac vasculature. Reproductive: The prostate is unremarkable. Other: No evidence of abdominal wall mass or hernia. Musculoskeletal: No acute or significant osseous findings. IMPRESSION: 1. Cecal colonic acute diverticulitis with a small foci of possible adjacent free air. No loculated fluid collections. 2. Status post appendectomy. 3. Mild wall thickening of the terminal ileum likely from adjacent inflammatory changes. 4.  Aortic Atherosclerosis (ICD10-I70.0). 5. Mild hepatic steatosis. 6. Small hiatal hernia. Electronically Signed   By: Prudencio Pair M.D.   On: 03/31/2019 01:15      Assessment/Plan CAD Tobacco abuse GERD Obesity - BMI 37.31  Cecal diverticulitis with microperforation - CT as  described above - WBC 7.6 and patient afebrile this AM - ok to have CLD but would not advance past this today - colace and mobilize today - no indication for urgent/emergent surgical intervention at this time  FEN: CLD, IVF VTE: SCDs, lovenox ID: cipro/flagyl 3/23>>  Admit to inpatient. Bowel rest and antibiotics.  Brigid Re, [facility] 03/31/2019, 9:27 AM Please see Amion for pager number during day hours 7:00am-4:30pm

## 2019-03-31 NOTE — Plan of Care (Signed)
  Problem: Clinical Measurements: Goal: Respiratory complications will improve Outcome: Progressing   Problem: Clinical Measurements: Goal: Cardiovascular complication will be avoided Outcome: Progressing   Problem: Nutrition: Goal: Adequate nutrition will be maintained Outcome: Progressing   Problem: Pain Managment: Goal: General experience of comfort will improve Outcome: Progressing   Problem: Clinical Measurements: Goal: Will remain free from infection Outcome: Progressing

## 2019-04-01 LAB — BASIC METABOLIC PANEL
Anion gap: 6 (ref 5–15)
BUN: 9 mg/dL (ref 6–20)
CO2: 29 mmol/L (ref 22–32)
Calcium: 8.6 mg/dL — ABNORMAL LOW (ref 8.9–10.3)
Chloride: 101 mmol/L (ref 98–111)
Creatinine, Ser: 0.98 mg/dL (ref 0.61–1.24)
GFR calc Af Amer: >60 mL/min (ref >60)
GFR calc non Af Amer: >60 mL/min (ref >60)
Glucose, Bld: 128 mg/dL — ABNORMAL HIGH (ref 70–99)
Potassium: 4 mmol/L (ref 3.5–5.1)
Sodium: 136 mmol/L (ref 135–145)

## 2019-04-01 LAB — CBC
HCT: 41.3 % (ref 39.0–52.0)
Hemoglobin: 13.7 g/dL (ref 13.0–17.0)
MCH: 30.9 pg (ref 26.0–34.0)
MCHC: 33.2 g/dL (ref 30.0–36.0)
MCV: 93 fL (ref 80.0–100.0)
Platelets: 237 10*3/uL (ref 150–400)
RBC: 4.44 MIL/uL (ref 4.22–5.81)
RDW: 13.1 % (ref 11.5–15.5)
WBC: 6.6 10*3/uL (ref 4.0–10.5)
nRBC: 0 % (ref 0.0–0.2)

## 2019-04-01 MED ORDER — OXYCODONE HCL 5 MG PO TABS
5.0000 mg | ORAL_TABLET | ORAL | Status: DC | PRN
  Administered 2019-04-01: 5 mg via ORAL
  Administered 2019-04-01 – 2019-04-04 (×10): 10 mg via ORAL
  Filled 2019-04-01: qty 1
  Filled 2019-04-01 (×10): qty 2

## 2019-04-01 MED ORDER — HYDROMORPHONE HCL 1 MG/ML IJ SOLN: 1.0000 mg | INTRAMUSCULAR | Status: DC | PRN

## 2019-04-01 MED ORDER — OMEPRAZOLE MAGNESIUM 20 MG PO TBEC: 20.0000 mg | DELAYED_RELEASE_TABLET | Freq: Every day | ORAL | Status: DC

## 2019-04-01 MED ORDER — ACETAMINOPHEN 325 MG PO TABS
650.0000 mg | ORAL_TABLET | Freq: Four times a day (QID) | ORAL | Status: DC | PRN
  Administered 2019-04-03: 650 mg via ORAL
  Filled 2019-04-01: qty 2

## 2019-04-01 MED ORDER — MORPHINE SULFATE (PF) 2 MG/ML IV SOLN
1.0000 mg | INTRAVENOUS | Status: DC | PRN
  Administered 2019-04-01 – 2019-04-02 (×4): 2 mg via INTRAVENOUS
  Filled 2019-04-01 (×6): qty 1

## 2019-04-01 MED ORDER — PROMETHAZINE HCL 25 MG/ML IJ SOLN
25.0000 mg | Freq: Four times a day (QID) | INTRAMUSCULAR | Status: DC | PRN
  Administered 2019-04-01 – 2019-04-03 (×2): 25 mg via INTRAVENOUS
  Filled 2019-04-01 (×2): qty 1

## 2019-04-01 NOTE — Progress Notes (Signed)
[facility] Progress Note     Subjective: Patient reports he still has some abdominal pain but it is better than it was when he came in. Having bad acid reflux. Patient requests that I call his uncle who is a Engineer, drilling at [employer].   Review of Systems  Constitutional: Negative for chills and fever.  Gastrointestinal: Positive for abdominal pain, constipation, heartburn and nausea.  Genitourinary: Negative for dysuria, frequency and urgency.     Objective: Vital signs in last 24 hours: Temp:  [98 F (36.7 C)-98.8 F (37.1 C)] 98.5 F (36.9 C) (03/24 0800) Pulse Rate:  [47-71] 68 (03/24 0800) Resp:  [18-19] 18 (03/24 0800) BP: (110-147)/(68-98) 130/82 (03/24 0800) SpO2:  [95 %-100 %] 100 % (03/24 0800) Last BM Date: 03/30/19  Intake/Output from previous day: 03/23 0701 - 03/24 0700 In: 4311.6 [P.O.:1300; I.V.:2311.5; IV Piggyback:700.1] Out: 2900 [Urine:2900] Intake/Output this shift: Total I/O In: 240 [P.O.:240] Out: -   PE: General: pleasant, WD, WN male HEENT: Sclera are noninjected.  PERRL.  Ears and nose without any masses or lesions.  Mouth is pink and moist Heart: regular, rate, and rhythm.  Normal s1,s2. No obvious murmurs, gallops, or rubs noted.  Palpable radial and pedal pulses bilaterally Lungs: CTAB, no wheezes, rhonchi, or rales noted.  Respiratory effort nonlabored Abd: soft, mildly ttp in RLQ, no peritonitis, ND, +BS, no masses, hernias, or organomegaly MS: all 4 extremities are symmetrical with no cyanosis, clubbing, or edema. Skin: warm and dry with no masses, lesions, or rashes Neuro: Cranial nerves 2-12 grossly intact, sensation grossly intact throughout Psych: A&Ox3 with an appropriate affect.    Lab Results:  Recent Labs    03/31/19 0732 04/01/19 0437  WBC 7.6 6.6  HGB 14.1 13.7  HCT 42.8 41.3  PLT 255 237   BMET Recent Labs    03/30/19 2133 04/01/19 0437  NA 138 136  K 3.8 4.0  CL 101 101  CO2 28 29  GLUCOSE 121*  128*  BUN 13 9  CREATININE 1.00 0.98  CALCIUM 9.0 8.6*   PT/INR No results for input(s): LABPROT, INR in the last 72 hours. CMP     Component Value Date/Time   NA 136 04/01/2019 0437   K 4.0 04/01/2019 0437   CL 101 04/01/2019 0437   CO2 29 04/01/2019 0437   GLUCOSE 128 (H) 04/01/2019 0437   BUN 9 04/01/2019 0437   CREATININE 0.98 04/01/2019 0437   CALCIUM 8.6 (L) 04/01/2019 0437   PROT 7.6 03/30/2019 2133   ALBUMIN 4.1 03/30/2019 2133   AST 32 03/30/2019 2133   ALT 75 (H) 03/30/2019 2133   ALKPHOS 56 03/30/2019 2133   BILITOT 1.0 03/30/2019 2133   GFRNONAA >60 04/01/2019 0437   GFRAA >60 04/01/2019 0437   Lipase     Component Value Date/Time   LIPASE 29 03/30/2019 2133       Studies/Results: CT ABDOMEN PELVIS W CONTRAST  Result Date: 03/31/2019 CLINICAL DATA:  Right lower quadrant abdominal pain, question of diverticulitis EXAM: CT ABDOMEN AND PELVIS WITH CONTRAST TECHNIQUE: Multidetector CT imaging of the abdomen and pelvis was performed using the standard protocol following bolus administration of intravenous contrast. CONTRAST:  133mL OMNIPAQUE IOHEXOL 300 MG/ML  SOLN COMPARISON:  Multiple priors, most recent November 26, 2018 FINDINGS: Lower chest: The visualized heart size within normal limits. No pericardial fluid/thickening. There is a small hiatal hernia present. The visualized portions of the lungs are clear. Hepatobiliary: Mildly decreased attenuation of the liver  parenchyma.The main portal vein is patent. No evidence of calcified gallstones, gallbladder wall thickening or biliary dilatation. Pancreas: Unremarkable. No pancreatic ductal dilatation or surrounding inflammatory changes. Spleen: Normal in size without focal abnormality. Adrenals/Urinary Tract: Both adrenal glands appear normal. The kidneys and collecting system appear normal without evidence of urinary tract calculus or hydronephrosis. Bladder is unremarkable. Stomach/Bowel: The stomach, small bowel are  normal in appearance. The patient is status post appendectomy. There is scattered diverticula with diffuse wall thickening and surrounding mesenteric fat stranding changes seen around the cecum. There is question of a small foci of free air seen adjacent to the distal cecal pole and surgical clips in the right lower quadrant, series 2, image 49. No loculated fluid collection however is noted. There is also mild diffuse wall thickening seen at the terminal ileum. The remainder of the colon appears to be unremarkable. Vascular/Lymphatic: There are no enlarged mesenteric, retroperitoneal, or pelvic lymph nodes. Scattered atherosclerosis is seen in the distal abdominal aorta and iliac vasculature. Reproductive: The prostate is unremarkable. Other: No evidence of abdominal wall mass or hernia. Musculoskeletal: No acute or significant osseous findings. IMPRESSION: 1. Cecal colonic acute diverticulitis with a small foci of possible adjacent free air. No loculated fluid collections. 2. Status post appendectomy. 3. Mild wall thickening of the terminal ileum likely from adjacent inflammatory changes. 4.  Aortic Atherosclerosis (ICD10-I70.0). 5. Mild hepatic steatosis. 6. Small hiatal hernia. Electronically Signed   By: Jonna Clark M.D.   On: 03/31/2019 01:15    Anti-infectives: Anti-infectives (From admission, onward)   Start     Dose/Rate Route Frequency Ordered Stop   03/31/19 1400  metroNIDAZOLE (FLAGYL) IVPB 500 mg     500 mg 100 mL/hr over 60 Minutes Intravenous Every 8 hours 03/31/19 0344     03/31/19 1000  ciprofloxacin (CIPRO) IVPB 400 mg     400 mg 200 mL/hr over 60 Minutes Intravenous Every 12 hours 03/31/19 0344     03/31/19 0215  ciprofloxacin (CIPRO) IVPB 400 mg     400 mg 200 mL/hr over 60 Minutes Intravenous  Once 03/31/19 0206 03/31/19 0330   03/31/19 0215  metroNIDAZOLE (FLAGYL) IVPB 500 mg     500 mg 100 mL/hr over 60 Minutes Intravenous  Once 03/31/19 0206 03/31/19 0331        Assessment/Plan CAD Tobacco abuse GERD Obesity - BMI 37.31  Cecal diverticulitis with microperforation - CT 3/23 showed cecal diverticulitis with small foci of adjacent free air, no abscess - WBC 6.6 and patient afebrile this AM - advance to soft diet today  - no indication for urgent/emergent surgical intervention at this time  FEN: soft diet, decrease IVF VTE: SCDs, lovenox ID: cipro/flagyl 3/23>>  LOS: 1 day    Wells Guiles , [facility] Surgery 04/01/2019, 9:09 AM Please see Amion for pager number during day hours 7:00am-4:30pm

## 2019-04-02 ENCOUNTER — Inpatient Hospital Stay (HOSPITAL_COMMUNITY): Payer: 59

## 2019-04-02 LAB — CBC
HCT: 42.6 % (ref 39.0–52.0)
Hemoglobin: 14 g/dL (ref 13.0–17.0)
MCH: 29.9 pg (ref 26.0–34.0)
MCHC: 32.9 g/dL (ref 30.0–36.0)
MCV: 91 fL (ref 80.0–100.0)
Platelets: 247 10*3/uL (ref 150–400)
RBC: 4.68 MIL/uL (ref 4.22–5.81)
RDW: 12.7 % (ref 11.5–15.5)
WBC: 4.5 10*3/uL (ref 4.0–10.5)
nRBC: 0 % (ref 0.0–0.2)

## 2019-04-02 LAB — BASIC METABOLIC PANEL
Anion gap: 8 (ref 5–15)
BUN: 12 mg/dL (ref 6–20)
CO2: 26 mmol/L (ref 22–32)
Calcium: 8.6 mg/dL — ABNORMAL LOW (ref 8.9–10.3)
Chloride: 105 mmol/L (ref 98–111)
Creatinine, Ser: 0.95 mg/dL (ref 0.61–1.24)
GFR calc Af Amer: >60 mL/min (ref >60)
GFR calc non Af Amer: >60 mL/min (ref >60)
Glucose, Bld: 91 mg/dL (ref 70–99)
Potassium: 4.3 mmol/L (ref 3.5–5.1)
Sodium: 139 mmol/L (ref 135–145)

## 2019-04-02 IMAGING — DX DG ABD PORTABLE 1V
2 series · 2 of 2 positions shown · non-contrast
Comparison: CT abdomen and pelvis [DATE]

CLINICAL DATA: Abdominal pain and constipation

EXAM:
PORTABLE ABDOMEN - 1 VIEW

[abdomen kub (1 of 2)]
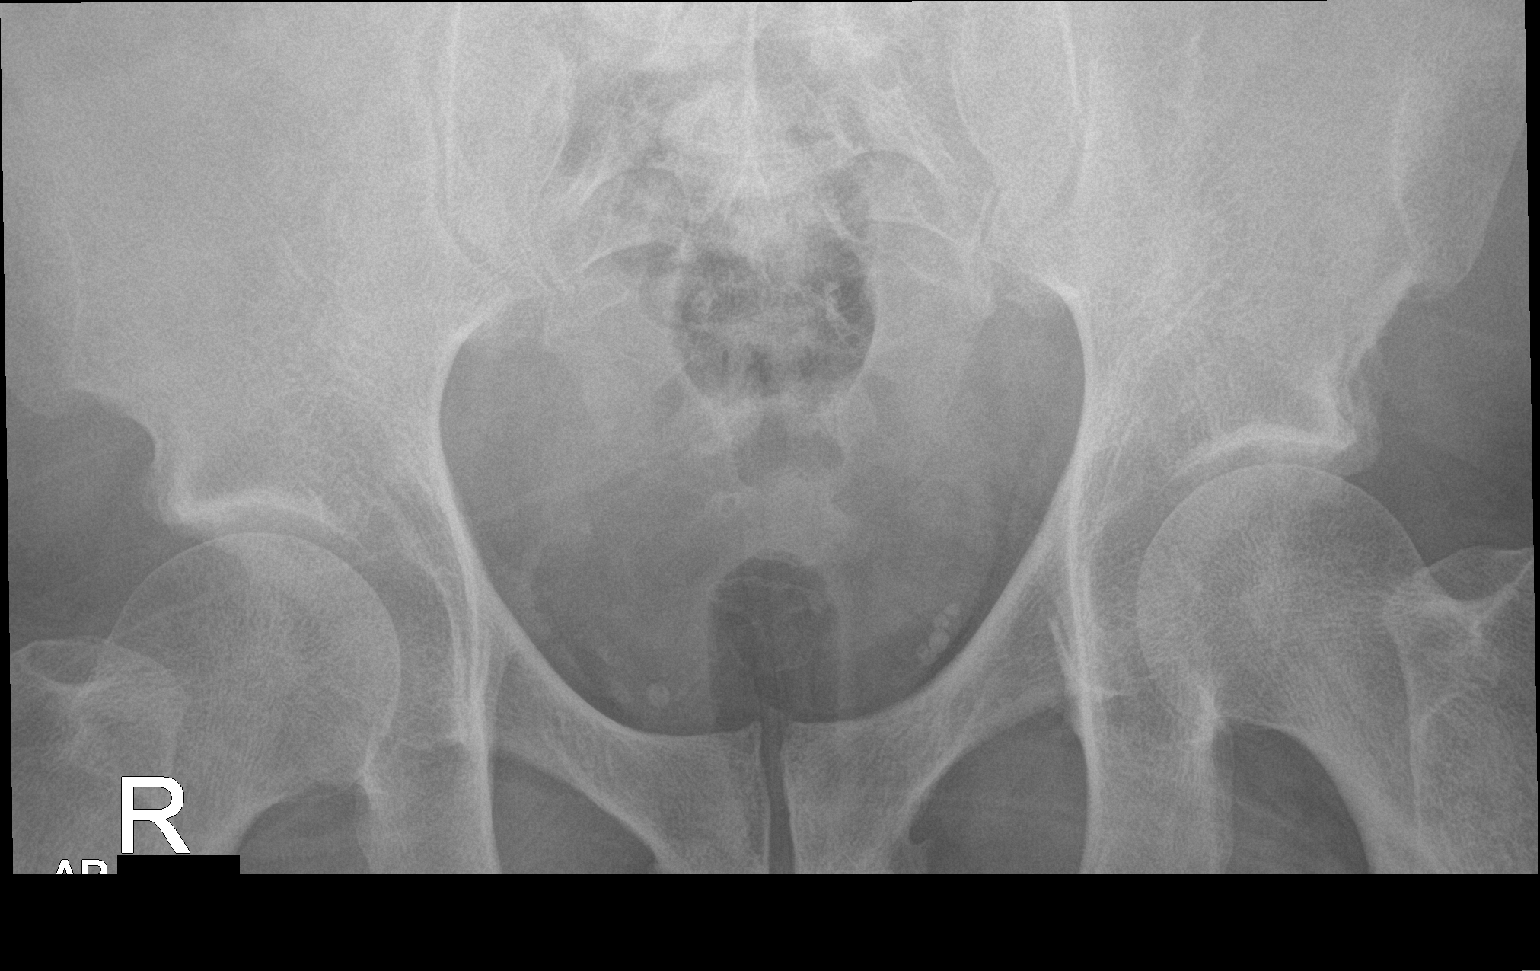

[abdomen kub (2 of 2)]
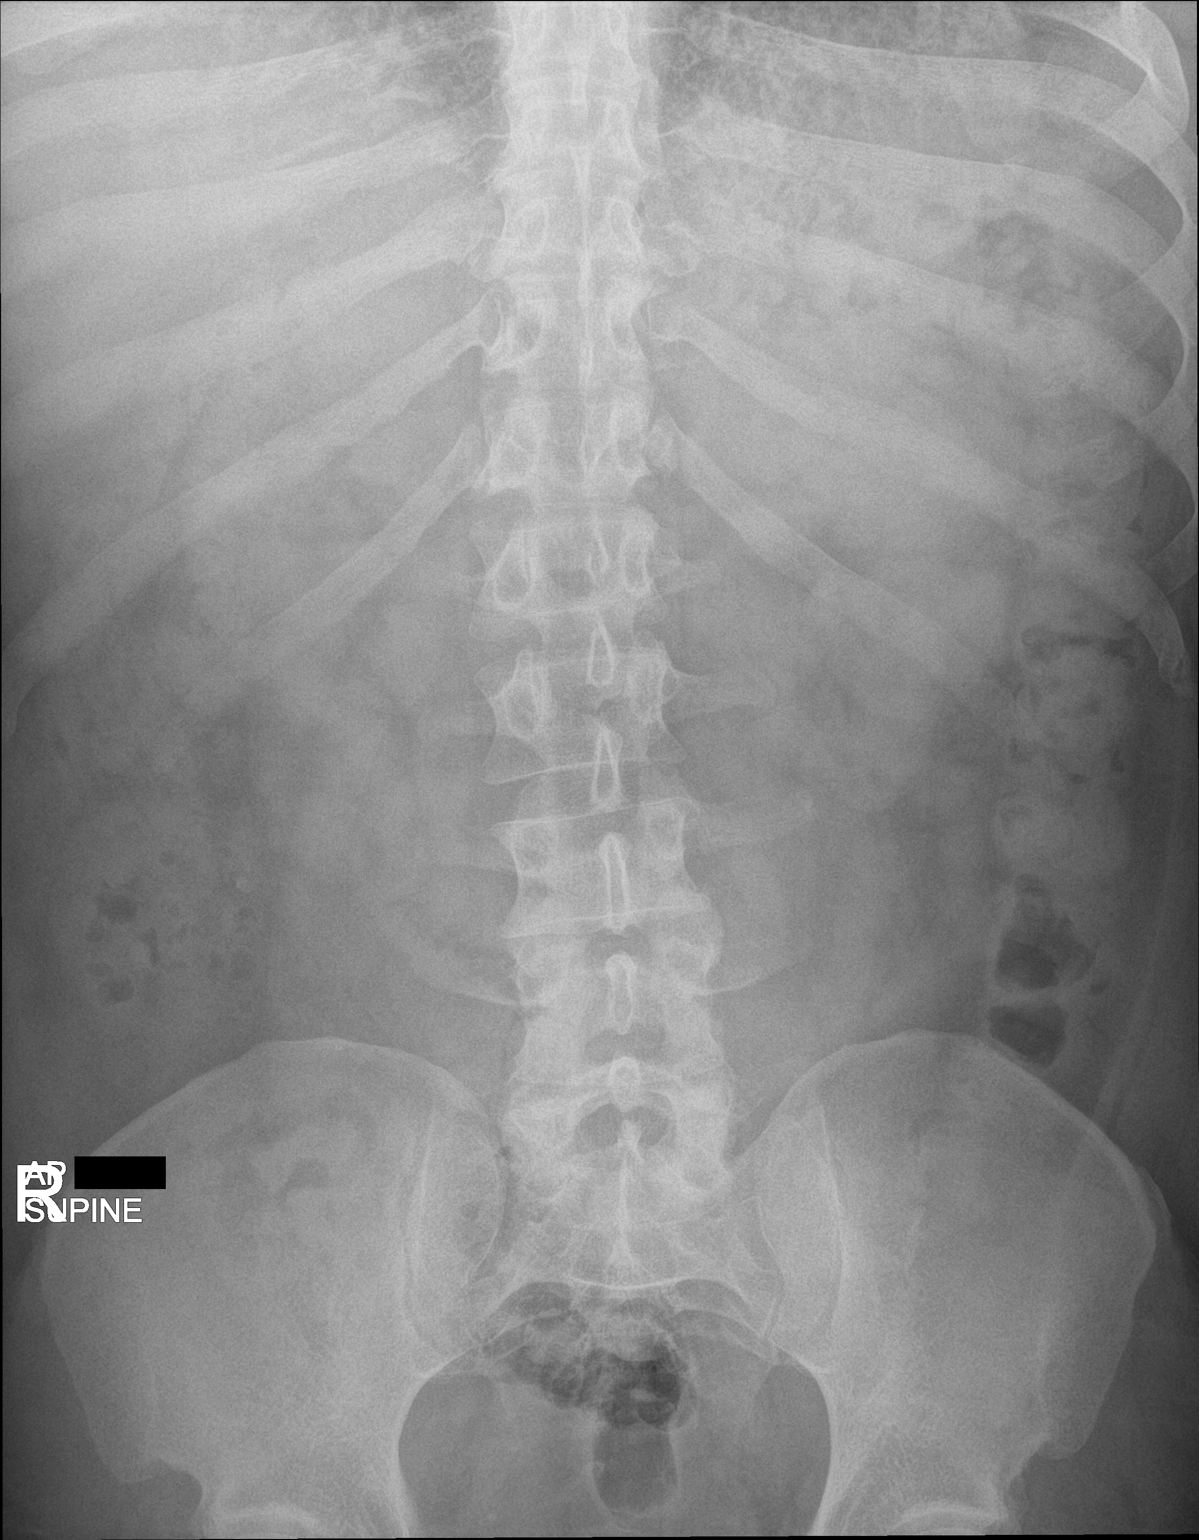

[2 of 2 positions shown; findings below may reference images not displayed]

FINDINGS: There is no demonstrable bowel dilatation or air-fluid level to
suggest bowel obstruction. There is moderate stool throughout the
colon. No evident free air. There are apparent phleboliths in the
pelvis.
IMPRESSION: Moderate stool throughout colon. No bowel obstruction or free air
evident. Bowel gas pattern unremarkable.

## 2019-04-02 MED ORDER — SODIUM CHLORIDE 0.9% FLUSH
3.0000 mL | Freq: Two times a day (BID) | INTRAVENOUS | Status: DC
  Administered 2019-04-02: 3 mL via INTRAVENOUS

## 2019-04-02 MED ORDER — SODIUM CHLORIDE 0.9% FLUSH: 3.0000 mL | INTRAVENOUS | Status: DC | PRN

## 2019-04-02 MED ORDER — POLYETHYLENE GLYCOL 3350 17 G PO PACK
17.0000 g | PACK | Freq: Every day | ORAL | Status: DC
  Administered 2019-04-02 – 2019-04-03 (×2): 17 g via ORAL
  Filled 2019-04-02 (×2): qty 1

## 2019-04-02 MED ORDER — SODIUM CHLORIDE 0.9 % IV SOLN: 250.0000 mL | INTRAVENOUS | Status: DC | PRN

## 2019-04-02 NOTE — Progress Notes (Signed)
Pharmacy IV to PO conversion  The patient is ordered Diphenhydramine by the intravenous route with a linked PO option available.  Based on criteria approved by the Pharmacy and Therapeutics Committee and the Medical Executive Committee, the IV option is being discontinued.   Not prescribed to treat or prevent a severe allergic reaction   Not prescribed as premedication prior to receiving blood product, biologic medication, antimicrobial, or chemotherapy agent   The patient has tolerated at least one dose of an oral or enteral medication   The patient has no evidence of active gastrointestinal bleeding or impaired GI absorption (gastrectomy, short bowel, patient on TNA or NPO).   The patient is not undergoing procedural sedation  If you have any questions about this conversion, please contact the Pharmacy Department (ext [phone]).  Thank you.  Bernadene Person, PharmD, BCPS [phone] 04/02/2019, 3:08 PM

## 2019-04-02 NOTE — Progress Notes (Signed)
Responded to PIV consult to check previous PIV. Readjusted kink in tubing; fluids infusing without issue. Consult cleared.

## 2019-04-02 NOTE — Progress Notes (Signed)
[facility] Progress Note     Subjective: Patient reports he still has some mild abdominal pain. Still having some nausea intermittently and vomited once yesterday. Patient passing flatus but no BM.   Review of Systems  Constitutional: Negative for chills and fever.  Respiratory: Negative for shortness of breath.   Cardiovascular: Negative for chest pain.  Gastrointestinal: Positive for abdominal pain, constipation, nausea and vomiting. Negative for blood in stool, diarrhea and melena.  Genitourinary: Negative for dysuria, frequency and urgency.     Objective: Vital signs in last 24 hours: Temp:  [98.1 F (36.7 C)-98.6 F (37 C)] 98.2 F (36.8 C) (03/25 0510) Pulse Rate:  [47-64] 47 (03/25 0510) Resp:  [17] 17 (03/25 0510) BP: (103-137)/(64-90) 103/64 (03/25 0510) SpO2:  [95 %-99 %] 99 % (03/25 0510) Last BM Date: 03/30/19  Intake/Output from previous day: 03/24 0701 - 03/25 0700 In: 3153.4 [P.O.:1440; I.V.:1013.4; IV Piggyback:700] Out: 3850 [Urine:3850] Intake/Output this shift: No intake/output data recorded.  PE: General: pleasant, WD, WN male HEENT: Sclera are noninjected. PERRL. Ears and nose without any masses or lesions. Mouth is pink and moist Heart: regular, rate, and rhythm. Normal s1,s2. No obvious murmurs, gallops, or rubs noted. Palpable radial and pedal pulses bilaterally Lungs: CTAB, no wheezes, rhonchi, or rales noted. Respiratory effort nonlabored Abd: soft,mildly ttp in RLQ, no peritonitis, ND, +BS, no masses, hernias, or organomegaly MS: all 4 extremities are symmetrical with no cyanosis, clubbing, or edema. Skin: warm and dry with no masses, lesions, or rashes Neuro: Cranial nerves 2-12 grossly intact, sensation grossly intact throughout Psych: A&Ox3 with an appropriate affect.   Lab Results:  Recent Labs    04/01/19 0437 04/02/19 0433  WBC 6.6 4.5  HGB 13.7 14.0  HCT 41.3 42.6  PLT 237 247   BMET Recent Labs   04/01/19 0437 04/02/19 0433  NA 136 139  K 4.0 4.3  CL 101 105  CO2 29 26  GLUCOSE 128* 91  BUN 9 12  CREATININE 0.98 0.95  CALCIUM 8.6* 8.6*   PT/INR No results for input(s): LABPROT, INR in the last 72 hours. CMP     Component Value Date/Time   NA 139 04/02/2019 0433   K 4.3 04/02/2019 0433   CL 105 04/02/2019 0433   CO2 26 04/02/2019 0433   GLUCOSE 91 04/02/2019 0433   BUN 12 04/02/2019 0433   CREATININE 0.95 04/02/2019 0433   CALCIUM 8.6 (L) 04/02/2019 0433   PROT 7.6 03/30/2019 2133   ALBUMIN 4.1 03/30/2019 2133   AST 32 03/30/2019 2133   ALT 75 (H) 03/30/2019 2133   ALKPHOS 56 03/30/2019 2133   BILITOT 1.0 03/30/2019 2133   GFRNONAA >60 04/02/2019 0433   GFRAA >60 04/02/2019 0433   Lipase     Component Value Date/Time   LIPASE 29 03/30/2019 2133       Studies/Results: No results found.  Anti-infectives: Anti-infectives (From admission, onward)   Start     Dose/Rate Route Frequency Ordered Stop   03/31/19 1400  metroNIDAZOLE (FLAGYL) IVPB 500 mg     500 mg 100 mL/hr over 60 Minutes Intravenous Every 8 hours 03/31/19 0344     03/31/19 1000  ciprofloxacin (CIPRO) IVPB 400 mg     400 mg 200 mL/hr over 60 Minutes Intravenous Every 12 hours 03/31/19 0344     03/31/19 0215  ciprofloxacin (CIPRO) IVPB 400 mg     400 mg 200 mL/hr over 60 Minutes Intravenous  Once 03/31/19 0206 03/31/19 0330  03/31/19 0215  metroNIDAZOLE (FLAGYL) IVPB 500 mg     500 mg 100 mL/hr over 60 Minutes Intravenous  Once 03/31/19 0206 03/31/19 0331       Assessment/Plan CAD Tobacco abuse GERD Obesity - BMI 37.31  Cecal diverticulitis with microperforation - CT 3/23 showed cecal diverticulitis with small foci of adjacent free air, no abscess - WBC 4.5 and patient afebrile this AM - pain improving, still constipated and having some nausea - check abdominal film  - plan for follow up with Dr. Hassell Done and likely plan dx laparoscopy in a few weeks - consider revision of  appendectomy if remnant present vs colon resection for diverticulitis - no indication for urgent/emergent surgical intervention at this time  FEN: soft diet, SLIV VTE: SCDs, lovenox ID: cipro/flagyl 3/23>>  LOS: 2 days    Brigid Re , [facility] Surgery 04/02/2019, 11:18 AM Please see Amion for pager number during day hours 7:00am-4:30pm

## 2019-04-03 LAB — BASIC METABOLIC PANEL
Anion gap: 9 (ref 5–15)
BUN: 12 mg/dL (ref 6–20)
CO2: 27 mmol/L (ref 22–32)
Calcium: 8.7 mg/dL — ABNORMAL LOW (ref 8.9–10.3)
Chloride: 102 mmol/L (ref 98–111)
Creatinine, Ser: 1.1 mg/dL (ref 0.61–1.24)
GFR calc Af Amer: >60 mL/min (ref >60)
GFR calc non Af Amer: >60 mL/min (ref >60)
Glucose, Bld: 101 mg/dL — ABNORMAL HIGH (ref 70–99)
Potassium: 4 mmol/L (ref 3.5–5.1)
Sodium: 138 mmol/L (ref 135–145)

## 2019-04-03 LAB — CBC
HCT: 44 % (ref 39.0–52.0)
Hemoglobin: 14.6 g/dL (ref 13.0–17.0)
MCH: 30.2 pg (ref 26.0–34.0)
MCHC: 33.2 g/dL (ref 30.0–36.0)
MCV: 91.1 fL (ref 80.0–100.0)
Platelets: 268 10*3/uL (ref 150–400)
RBC: 4.83 MIL/uL (ref 4.22–5.81)
RDW: 12.8 % (ref 11.5–15.5)
WBC: 6 10*3/uL (ref 4.0–10.5)
nRBC: 0 % (ref 0.0–0.2)

## 2019-04-03 MED ORDER — SODIUM CHLORIDE 0.9 % IV SOLN: 250.0000 mL | INTRAVENOUS | Status: DC | PRN

## 2019-04-03 MED ORDER — MAGNESIUM CITRATE PO SOLN
0.5000 | Freq: Once | ORAL | Status: AC
  Administered 2019-04-03: 0.5 via ORAL
  Filled 2019-04-03: qty 296

## 2019-04-03 MED ORDER — NICOTINE 14 MG/24HR TD PT24
14.0000 mg | MEDICATED_PATCH | Freq: Every day | TRANSDERMAL | Status: DC
  Administered 2019-04-03: 14 mg via TRANSDERMAL
  Filled 2019-04-03: qty 1

## 2019-04-03 MED ORDER — METRONIDAZOLE 500 MG PO TABS
500.0000 mg | ORAL_TABLET | Freq: Two times a day (BID) | ORAL | Status: DC
  Administered 2019-04-03: 500 mg via ORAL
  Filled 2019-04-03: qty 1

## 2019-04-03 MED ORDER — BISACODYL 10 MG RE SUPP
10.0000 mg | Freq: Once | RECTAL | Status: AC
  Administered 2019-04-03: 10 mg via RECTAL
  Filled 2019-04-03: qty 1

## 2019-04-03 MED ORDER — FLEET ENEMA 7-19 GM/118ML RE ENEM: 1.0000 | ENEMA | Freq: Every day | RECTAL | Status: DC | PRN

## 2019-04-03 MED ORDER — SODIUM CHLORIDE 0.9 % BOLUS PEDS: 1000.0000 mL | Freq: Once | INTRAVENOUS | Status: DC

## 2019-04-03 MED ORDER — CIPROFLOXACIN HCL 500 MG PO TABS
500.0000 mg | ORAL_TABLET | Freq: Two times a day (BID) | ORAL | Status: DC
  Administered 2019-04-03 – 2019-04-04 (×3): 500 mg via ORAL
  Filled 2019-04-03 (×3): qty 1

## 2019-04-03 NOTE — Progress Notes (Addendum)
[facility] Progress Note     Subjective: Patient reports he threw up clear emesis again this AM. Passing flatus but still no BM. Pain still present but milder. Has been ambulating in hallways.  Review of Systems  Constitutional: Negative for chills and fever.  Gastrointestinal: Positive for abdominal pain, constipation, nausea and vomiting.     Objective: Vital signs in last 24 hours: Temp:  [98.3 F (36.8 C)-98.5 F (36.9 C)] 98.5 F (36.9 C) (03/26 0511) Pulse Rate:  [55-68] 63 (03/26 0511) Resp:  [16-18] 18 (03/26 0511) BP: (116-131)/(71-89) 131/84 (03/26 0511) SpO2:  [95 %-100 %] 100 % (03/26 0511) Last BM Date: 03/30/19  Intake/Output from previous day: 03/25 0701 - 03/26 0700 In: 1020 [P.O.:720; IV Piggyback:300] Out: 701 [Urine:700; Emesis/NG output:1] Intake/Output this shift: Total I/O In: 240 [P.O.:240] Out: -   PE: General: pleasant, WD, WN male HEENT: Sclera are noninjected. PERRL. Ears and nose without any masses or lesions. Mouth is pink and moist Heart: regular, rate, and rhythm. Normal s1,s2. No obvious murmurs, gallops, or rubs noted. Palpable radial and pedal pulses bilaterally Lungs: CTAB, no wheezes, rhonchi, or rales noted. Respiratory effort nonlabored Abd: soft,mildly ttp in RLQ, no peritonitis, ND, +BS, no masses, hernias, or organomegaly MS: all 4 extremities are symmetrical with no cyanosis, clubbing, or edema. Skin: warm and dry with no masses, lesions, or rashes Neuro: Cranial nerves 2-12 grossly intact, sensation grossly intact throughout Psych: A&Ox3 with an appropriate affect.   Lab Results:  Recent Labs    04/02/19 0433 04/03/19 0501  WBC 4.5 6.0  HGB 14.0 14.6  HCT 42.6 44.0  PLT 247 268   BMET Recent Labs    04/02/19 0433 04/03/19 0501  NA 139 138  K 4.3 4.0  CL 105 102  CO2 26 27  GLUCOSE 91 101*  BUN 12 12  CREATININE 0.95 1.10  CALCIUM 8.6* 8.7*   PT/INR No results for input(s): LABPROT,  INR in the last 72 hours. CMP     Component Value Date/Time   NA 138 04/03/2019 0501   K 4.0 04/03/2019 0501   CL 102 04/03/2019 0501   CO2 27 04/03/2019 0501   GLUCOSE 101 (H) 04/03/2019 0501   BUN 12 04/03/2019 0501   CREATININE 1.10 04/03/2019 0501   CALCIUM 8.7 (L) 04/03/2019 0501   PROT 7.6 03/30/2019 2133   ALBUMIN 4.1 03/30/2019 2133   AST 32 03/30/2019 2133   ALT 75 (H) 03/30/2019 2133   ALKPHOS 56 03/30/2019 2133   BILITOT 1.0 03/30/2019 2133   GFRNONAA >60 04/03/2019 0501   GFRAA >60 04/03/2019 0501   Lipase     Component Value Date/Time   LIPASE 29 03/30/2019 2133       Studies/Results: DG Abd Portable 1V  Result Date: 04/02/2019 CLINICAL DATA:  Abdominal pain and constipation EXAM: PORTABLE ABDOMEN - 1 VIEW COMPARISON:  CT abdomen and pelvis March 31, 2019 FINDINGS: There is no demonstrable bowel dilatation or air-fluid level to suggest bowel obstruction. There is moderate stool throughout the colon. No evident free air. There are apparent phleboliths in the pelvis. IMPRESSION: Moderate stool throughout colon. No bowel obstruction or free air evident. Bowel gas pattern unremarkable. Electronically Signed   By: Bretta Bang III M.D.   On: 04/02/2019 11:49    Anti-infectives: Anti-infectives (From admission, onward)   Start     Dose/Rate Route Frequency Ordered Stop   04/03/19 1800  metroNIDAZOLE (FLAGYL) tablet 500 mg     500  mg Oral Every 12 hours 04/03/19 0923 04/13/19 2159   04/03/19 0930  ciprofloxacin (CIPRO) tablet 500 mg     500 mg Oral 2 times daily 04/03/19 9767 04/13/19 0759   03/31/19 1400  metroNIDAZOLE (FLAGYL) IVPB 500 mg  Status:  Discontinued     500 mg 100 mL/hr over 60 Minutes Intravenous Every 8 hours 03/31/19 0344 04/03/19 0923   03/31/19 1000  ciprofloxacin (CIPRO) IVPB 400 mg  Status:  Discontinued     400 mg 200 mL/hr over 60 Minutes Intravenous Every 12 hours 03/31/19 0344 04/03/19 0923   03/31/19 0215  ciprofloxacin (CIPRO)  IVPB 400 mg     400 mg 200 mL/hr over 60 Minutes Intravenous  Once 03/31/19 0206 03/31/19 0330   03/31/19 0215  metroNIDAZOLE (FLAGYL) IVPB 500 mg     500 mg 100 mL/hr over 60 Minutes Intravenous  Once 03/31/19 0206 03/31/19 0331       Assessment/Plan CAD Tobacco abuse GERD Obesity - BMI 37.31  Cecal diverticulitis with microperforation vs retained appendiceal fragment after perforated appendicitis s/p lap appendectomy  - CT3/23 showed cecal diverticulitis with small foci of adjacent free air, no abscess - WBC6 and patient remains afebrile -pain improving, still constipated and having some nausea - abdominal film yesterday showed moderate stool throughout colon, try suppository and magnesium citrate today  - plan for follow up with Dr. Hassell Done and likely plan dx laparoscopy in a few weeks - consider revision of appendectomy if remnant present vs colon resection for diverticulitis - no indication for urgent/emergent surgical intervention at this time  HAL:PFXT diet VTE: SCDs, lovenox ID: cipro/flagyl 3/23>3/26; PO cipro/flagyl 3/26>>  LOS: 3 days    Brigid Re , [facility] Surgery 04/03/2019, 9:36 AM Please see Amion for pager number during day hours 7:00am-4:30pm

## 2019-04-03 NOTE — Progress Notes (Signed)
Patient requests PA Tresa Endo [place] call him; Tresa Endo paged regarding same. Lina Sar, RN

## 2019-04-03 NOTE — Progress Notes (Signed)
Patient had 1 episode of clear emesis.

## 2019-04-04 MED ORDER — CIPROFLOXACIN HCL 500 MG PO TABS: 500.0000 mg | ORAL_TABLET | Freq: Two times a day (BID) | ORAL | 0 refills | Status: AC

## 2019-04-04 MED ORDER — METRONIDAZOLE 500 MG PO TABS: 500.0000 mg | ORAL_TABLET | Freq: Two times a day (BID) | ORAL | 0 refills | Status: AC

## 2019-04-04 MED ORDER — OXYCODONE HCL 5 MG PO TABS: 5.0000 mg | ORAL_TABLET | Freq: Four times a day (QID) | ORAL | 0 refills | Status: DC | PRN

## 2019-04-04 MED ORDER — POLYETHYLENE GLYCOL 3350 17 G PO PACK: 17.0000 g | PACK | Freq: Every day | ORAL | 0 refills | Status: DC

## 2019-04-04 MED ORDER — ACETAMINOPHEN 325 MG PO TABS: 650.0000 mg | ORAL_TABLET | Freq: Four times a day (QID) | ORAL | Status: DC | PRN

## 2019-04-04 NOTE — Discharge Summary (Signed)
Physician Discharge Summary  Patient ID: Kyle Silva MRN: [id number] DOB/AGE: 1980-06-10 39 y.o.  Admit date: 03/31/2019 Discharge date: 04/04/2019  Admission Diagnoses:  Discharge Diagnoses:  Active Problems:   Diverticulitis   Discharged Condition: good  Hospital Course: Admitted for IV antibiotic therapy for cecal diverticulitis versus retained appendiceal fragment.  He also developed constipation/ileus.  His white blood cell count remained normal and he remained afebrile.  He was able to have a bowel movement on 3/26.  He was deemed stable for discharge home with close follow-up.  Discharge Exam: Blood pressure 110/83, pulse 66, temperature 98.2 F (36.8 C), temperature source Oral, resp. rate 18, height 5\' 10"  (1.778 m), weight 117.9 kg, SpO2 100 %. General appearance: alert and cooperative Resp: Unlabored GI: Soft, obese, mildly tender in the right lower quadrant without peritoneal signs  Disposition: Discharge disposition: 01-Home or Self Care       Discharge Instructions    Diet - low sodium heart healthy   Complete by: As directed    Increase activity slowly   Complete by: As directed      Allergies as of 04/04/2019      Reactions   Penicillins Anaphylaxis   Has patient had a PCN reaction causing immediate rash, facial/tongue/throat swelling, SOB or lightheadedness with hypotension:Yes Has patient had a PCN reaction causing severe rash involving mucus membranes or skin necrosis:unsure Has patient had a PCN reaction that required hospitalization:Yes Has patient had a PCN reaction occurring within the last 10 years:Yes If all of the above answers are "NO", then may proceed with Cephalosporin use.       Medication List    TAKE these medications   acetaminophen 325 MG tablet Commonly known as: TYLENOL Take 2 tablets (650 mg total) by mouth every 6 (six) hours as needed for mild pain or fever.   aspirin EC 81 MG tablet Take 81 mg by mouth daily.    atorvastatin 40 MG tablet Commonly known as: LIPITOR Take 1 tablet (40 mg total) by mouth daily.   ciprofloxacin 500 MG tablet Commonly known as: CIPRO Take 1 tablet (500 mg total) by mouth 2 (two) times daily for 10 days.   metroNIDAZOLE 500 MG tablet Commonly known as: FLAGYL Take 1 tablet (500 mg total) by mouth every 12 (twelve) hours for 10 days.   omeprazole 20 MG tablet Commonly known as: PRILOSEC OTC Take 20 mg by mouth daily.   oxyCODONE 5 MG immediate release tablet Commonly known as: Oxy IR/ROXICODONE Take 1 tablet (5 mg total) by mouth every 6 (six) hours as needed for severe pain.   polyethylene glycol 17 g packet Commonly known as: MIRALAX / GLYCOLAX Take 17 g by mouth daily.      Follow-up Information    [facility]. Go on 04/16/2019.   Specialty: General Surgery Why: Follow up appointment with Dr. 06/16/2019 scheduled for 10:45 AM. Please arrive 30 min prior to appointment time. Bring photo ID and insurance information with you. Contact information: [address] [place] [phone]           Signed: [phone] 04/04/2019, 8:34 AM

## 2019-04-04 NOTE — Progress Notes (Signed)
Discharge instructions discussed with patient and family, verbalized agreement and understanding 

## 2019-04-04 NOTE — Progress Notes (Signed)
[facility] Progress Note     Subjective: Pain about the same.  Does not report any vomiting to me.     Objective: Vital signs in last 24 hours: Temp:  [98.2 F (36.8 C)-98.8 F (37.1 C)] 98.2 F (36.8 C) (03/27 0535) Pulse Rate:  [66-78] 66 (03/27 0535) Resp:  [18] 18 (03/27 0535) BP: (110-139)/(69-87) 110/83 (03/27 0535) SpO2:  [92 %-100 %] 100 % (03/27 0535) Last BM Date: 03/30/19  Intake/Output from previous day: 03/26 0701 - 03/27 0700 In: 720 [P.O.:720] Out: -  Intake/Output this shift: No intake/output data recorded.  PE: General: Alert and well-appearing HEENT: Sclera are noninjected. PERRL. Ears and nose without any masses or lesions. Mouth is pink and moist Heart: regular, rate, and rhythm. Palpable radial and pedal pulses bilaterally Lungs: CTAB, no wheezes, rhonchi, or rales noted. Respiratory effort nonlabored Abd: soft,mildly ttp in RLQ, no peritonitis, ND, +BS, no masses, hernias, or organomegaly MS: all 4 extremities are symmetrical with no cyanosis, clubbing, or edema. Skin: warm and dry with no masses, lesions, or rashes Neuro: Cranial nerves 2-12 grossly intact, sensation grossly intact throughout Psych: A&Ox3 with an appropriate affect.   Lab Results:  Recent Labs    04/02/19 0433 04/03/19 0501  WBC 4.5 6.0  HGB 14.0 14.6  HCT 42.6 44.0  PLT 247 268   BMET Recent Labs    04/02/19 0433 04/03/19 0501  NA 139 138  K 4.3 4.0  CL 105 102  CO2 26 27  GLUCOSE 91 101*  BUN 12 12  CREATININE 0.95 1.10  CALCIUM 8.6* 8.7*   PT/INR No results for input(s): LABPROT, INR in the last 72 hours. CMP     Component Value Date/Time   NA 138 04/03/2019 0501   K 4.0 04/03/2019 0501   CL 102 04/03/2019 0501   CO2 27 04/03/2019 0501   GLUCOSE 101 (H) 04/03/2019 0501   BUN 12 04/03/2019 0501   CREATININE 1.10 04/03/2019 0501   CALCIUM 8.7 (L) 04/03/2019 0501   PROT 7.6 03/30/2019 2133   ALBUMIN 4.1 03/30/2019 2133   AST 32  03/30/2019 2133   ALT 75 (H) 03/30/2019 2133   ALKPHOS 56 03/30/2019 2133   BILITOT 1.0 03/30/2019 2133   GFRNONAA >60 04/03/2019 0501   GFRAA >60 04/03/2019 0501   Lipase     Component Value Date/Time   LIPASE 29 03/30/2019 2133       Studies/Results: DG Abd Portable 1V  Result Date: 04/02/2019 CLINICAL DATA:  Abdominal pain and constipation EXAM: PORTABLE ABDOMEN - 1 VIEW COMPARISON:  CT abdomen and pelvis March 31, 2019 FINDINGS: There is no demonstrable bowel dilatation or air-fluid level to suggest bowel obstruction. There is moderate stool throughout the colon. No evident free air. There are apparent phleboliths in the pelvis. IMPRESSION: Moderate stool throughout colon. No bowel obstruction or free air evident. Bowel gas pattern unremarkable. Electronically Signed   By: Lowella Grip III M.D.   On: 04/02/2019 11:49    Anti-infectives: Anti-infectives (From admission, onward)   Start     Dose/Rate Route Frequency Ordered Stop   04/03/19 1800  metroNIDAZOLE (FLAGYL) tablet 500 mg     500 mg Oral Every 12 hours 04/03/19 0923 04/13/19 2159   04/03/19 0930  ciprofloxacin (CIPRO) tablet 500 mg     500 mg Oral 2 times daily 04/03/19 0923 04/13/19 0759   03/31/19 1400  metroNIDAZOLE (FLAGYL) IVPB 500 mg  Status:  Discontinued     500 mg  100 mL/hr over 60 Minutes Intravenous Every 8 hours 03/31/19 0344 04/03/19 0923   03/31/19 1000  ciprofloxacin (CIPRO) IVPB 400 mg  Status:  Discontinued     400 mg 200 mL/hr over 60 Minutes Intravenous Every 12 hours 03/31/19 0344 04/03/19 0923   03/31/19 0215  ciprofloxacin (CIPRO) IVPB 400 mg     400 mg 200 mL/hr over 60 Minutes Intravenous  Once 03/31/19 0206 03/31/19 0330   03/31/19 0215  metroNIDAZOLE (FLAGYL) IVPB 500 mg     500 mg 100 mL/hr over 60 Minutes Intravenous  Once 03/31/19 0206 03/31/19 0331       Assessment/Plan CAD Tobacco abuse GERD Obesity - BMI 37.31  Cecal diverticulitis with microperforation vs retained  appendiceal fragment after perforated appendicitis s/p lap appendectomy  - CT3/23 showed cecal diverticulitis with small foci of adjacent free air, no abscess - WBC6 and patient remains afebrile -pain improving, still constipated and having some nausea - abdominal film yesterday showed moderate stool throughout colon, try suppository and magnesium citrate today  - plan for follow up with Dr. Daphine Deutscher and likely plan dx laparoscopy in a few weeks - consider revision of appendectomy if remnant present vs colon resection for diverticulitis - no indication for urgent/emergent surgical intervention at this time  ZHY:QMVH diet VTE: SCDs, lovenox ID: cipro/flagyl 3/23>3/26; PO cipro/flagyl 3/26>>  LOS: 4 days    Berna Bue MD [facility] 04/04/2019, 8:28 AM Please see Amion to page appropriate on-call provider

## 2019-11-04 ENCOUNTER — Ambulatory Visit: Payer: 59 | Admitting: Podiatry

## 2019-11-15 ENCOUNTER — Encounter: Payer: Self-pay | Admitting: Cardiovascular Disease

## 2019-11-15 NOTE — Progress Notes (Signed)
This encounter was created in error - please disregard.

## 2019-11-16 ENCOUNTER — Ambulatory Visit: Payer: 59 | Admitting: Podiatry

## 2019-11-16 ENCOUNTER — Encounter: Payer: 59 | Admitting: Cardiovascular Disease

## 2019-11-20 ENCOUNTER — Encounter (HOSPITAL_COMMUNITY): Payer: Self-pay

## 2019-11-20 ENCOUNTER — Other Ambulatory Visit: Payer: Self-pay

## 2019-11-20 ENCOUNTER — Emergency Department (HOSPITAL_COMMUNITY)
Admission: EM | Admit: 2019-11-20 | Discharge: 2019-11-20 | Disposition: A | Discharge status: Home / Self Care | Payer: 59 | Attending: Emergency Medicine | Admitting: Emergency Medicine

## 2019-11-20 ENCOUNTER — Emergency Department (HOSPITAL_COMMUNITY): Payer: 59

## 2019-11-20 DIAGNOSIS — Z8616 Personal history of COVID-19: Secondary | ICD-10-CM | POA: Diagnosis not present

## 2019-11-20 DIAGNOSIS — Z87891 Personal history of nicotine dependence: Secondary | ICD-10-CM | POA: Diagnosis not present

## 2019-11-20 DIAGNOSIS — R0981 Nasal congestion: Secondary | ICD-10-CM | POA: Diagnosis not present

## 2019-11-20 DIAGNOSIS — R0600 Dyspnea, unspecified: Secondary | ICD-10-CM | POA: Insufficient documentation

## 2019-11-20 DIAGNOSIS — R0602 Shortness of breath: Secondary | ICD-10-CM | POA: Diagnosis present

## 2019-11-20 DIAGNOSIS — I2510 Atherosclerotic heart disease of native coronary artery without angina pectoris: Secondary | ICD-10-CM | POA: Insufficient documentation

## 2019-11-20 LAB — BASIC METABOLIC PANEL WITH GFR
Anion gap: 7 (ref 5–15)
BUN: 13 mg/dL (ref 6–20)
CO2: 28 mmol/L (ref 22–32)
Calcium: 8.7 mg/dL — ABNORMAL LOW (ref 8.9–10.3)
Chloride: 105 mmol/L (ref 98–111)
Creatinine, Ser: 0.91 mg/dL (ref 0.61–1.24)
GFR, Estimated: >60 mL/min
Glucose, Bld: 92 mg/dL (ref 70–99)
Potassium: 4.5 mmol/L (ref 3.5–5.1)
Sodium: 140 mmol/L (ref 135–145)

## 2019-11-20 LAB — CBC
HCT: 43.8 % (ref 39.0–52.0)
Hemoglobin: 14.6 g/dL (ref 13.0–17.0)
MCH: 30.9 pg (ref 26.0–34.0)
MCHC: 33.3 g/dL (ref 30.0–36.0)
MCV: 92.6 fL (ref 80.0–100.0)
Platelets: 256 10*3/uL (ref 150–400)
RBC: 4.73 MIL/uL (ref 4.22–5.81)
RDW: 12.8 % (ref 11.5–15.5)
WBC: 5.1 10*3/uL (ref 4.0–10.5)
nRBC: 0 % (ref 0.0–0.2)

## 2019-11-20 LAB — BRAIN NATRIURETIC PEPTIDE: B Natriuretic Peptide: 26.9 pg/mL (ref 0.0–100.0)

## 2019-11-20 IMAGING — CR DG CHEST 2V
2 series · 2 of 2 positions shown · non-contrast
Comparison: [DATE]

CLINICAL DATA: Shortness of breath and productive cough

EXAM:
CHEST - 2 VIEW

[w chest pa]
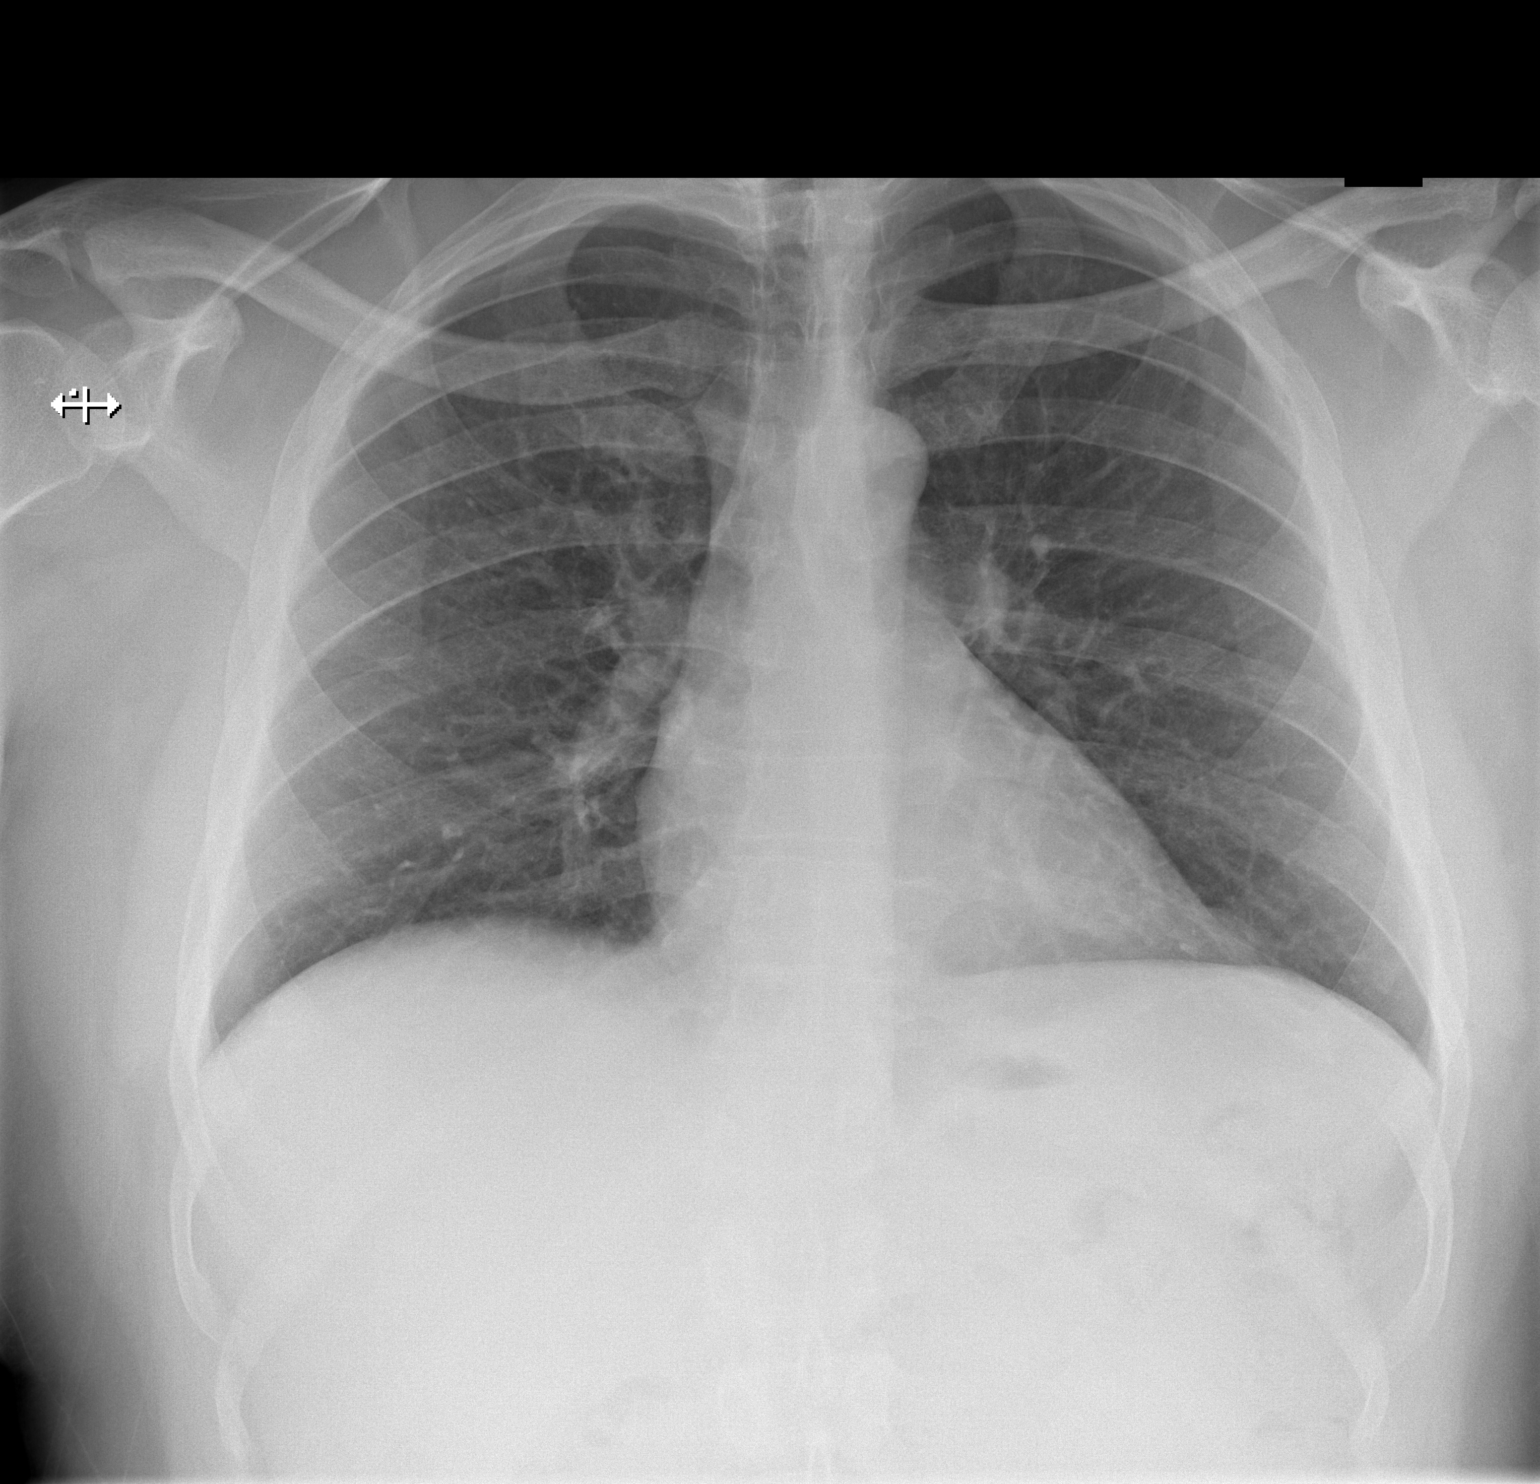

[w chest lat]
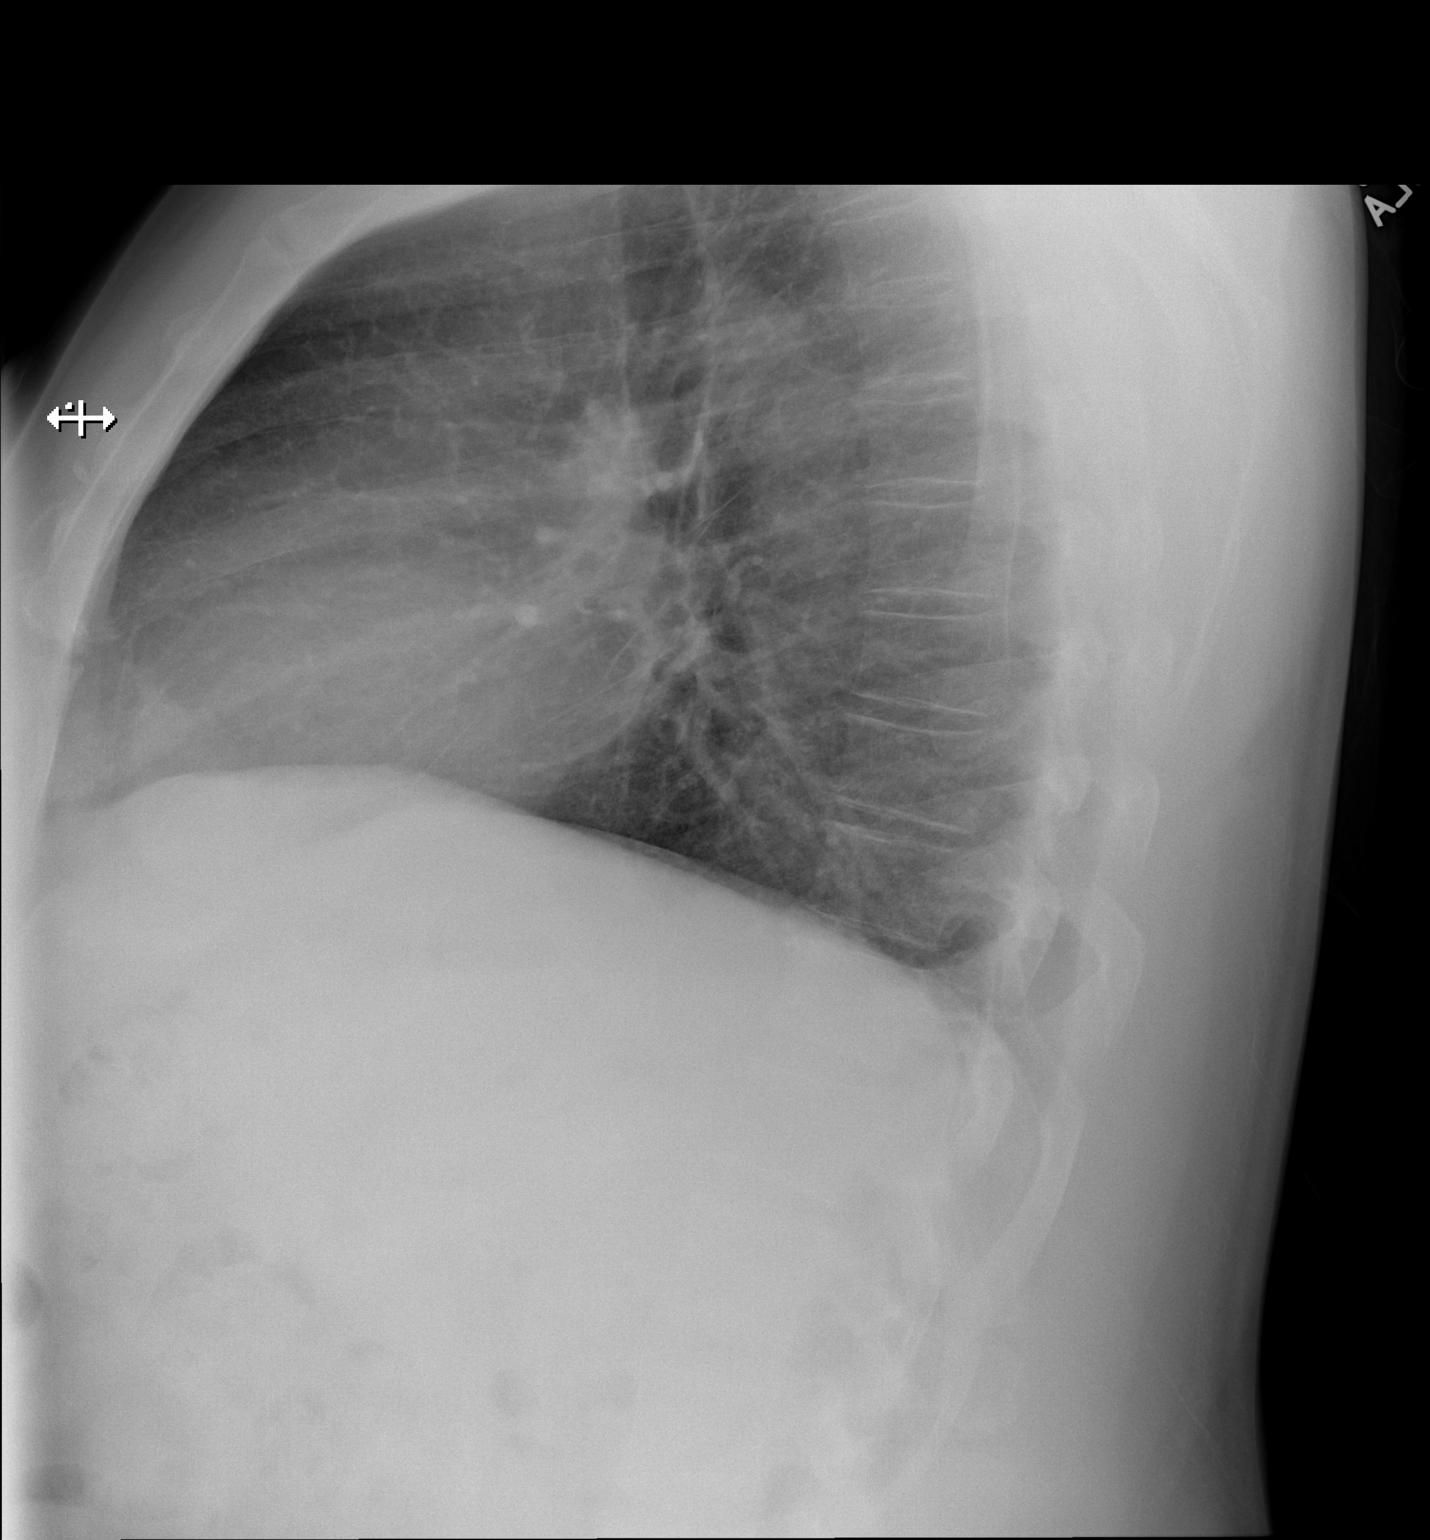

[2 of 2 positions shown; findings below may reference images not displayed]

FINDINGS: Normal heart size and mediastinal contours. No acute infiltrate or
edema. No effusion or pneumothorax. No acute osseous findings.
IMPRESSION: Negative chest

## 2019-11-20 MED ORDER — OMEPRAZOLE 20 MG PO CPDR: 20.0000 mg | DELAYED_RELEASE_CAPSULE | Freq: Two times a day (BID) | ORAL | 0 refills | Status: DC

## 2019-11-20 NOTE — Discharge Instructions (Addendum)
The ED tests today were normal.  As we discussed follow-up with primary care doctor as well as a sleep medicine doctor.  Return to the ED as needed for worsening symptoms

## 2019-11-20 NOTE — ED Provider Notes (Signed)
[facility]-EMERGENCY DEPT Provider Note   CSN: [id number] Arrival date & time: 11/20/19  0321     History Chief Complaint  Patient presents with  . Shortness of Breath    Kyle Silva is a 39 y.o. male.  HPI   Patient presents to the emergency room for evaluation of shortness of breath.  Patient states he has been having issues with feeling short of breath for several months now.  He notices it primarily when he goes to bed and getting up in the morning.  Feels congestion in his nose and in his chest.  He feels like he cannot catch his breath.  Patient states that he feels fine when he is active during the day.  He does not have any issues with getting short of breath with activity.  He has not had fevers.  He denies any coughing.  He is not having any chest pain.  No leg swelling.  Patient states he came in this morning because he was tired of having the symptoms and wanted to get it checked out. Past Medical History:  Diagnosis Date  . Acute appendicitis 09/18/2018  . Acute diverticulitis 08/30/2015  . CAD (coronary artery disease)    Mild with 40% LAD stenosis by cath in 2016  . Chest pain   . Current smoker 07/13/2014  . Diverticulitis   . Epididymitis   . Family history of adverse reaction to anesthesia    "dad had allergic reaction to anesthesia"   . GERD (gastroesophageal reflux disease)   . Obese 07/13/2014  . Ruptured appendicitis 09/18/2018    Patient Active Problem List   Diagnosis Date Noted  . Diverticulitis 03/31/2019  . Obesity, Class I, BMI 30.0-34.9 (see actual BMI) 10/15/2018  . COVID-19 virus infection 10/15/2018  . Intra-abdominal abscess (HCC) 10/14/2018  . Acute appendicitis 09/18/2018  . Ruptured appendicitis 09/18/2018  . Acute diverticulitis 08/30/2015  . Current smoker 07/13/2014  . Family history of premature CAD 07/13/2014  . Obese 07/13/2014  . Unstable angina (HCC) 07/13/2014  . Chest pain     Past Surgical History:    Procedure Laterality Date  . CARDIAC CATHETERIZATION N/A 07/13/2014   Procedure: Left Heart Cath and Coronary Angiography;  Surgeon: Marykay Lex, MD;  Location: [facility] INVASIVE CV LAB;  Service: Cardiovascular;  Laterality: N/A;  . CYSTECTOMY Left ~ 2014   "wrist"  . CYSTECTOMY Left ~ 2014   "2 on my foot""  . LAPAROSCOPIC APPENDECTOMY N/A 09/18/2018   Procedure: APPENDECTOMY LAPAROSCOPIC;  Surgeon: Luretha Murphy, MD;  Location: [facility] ORS;  Service: General;  Laterality: N/A;       Family History  Problem Relation Age of Onset  . AAA (abdominal aortic aneurysm) Mother   . Hypertension Mother   . Diabetes Mother   . Heart failure Father   . Stroke Father     Social History   Tobacco Use  . Smoking status: Former Smoker    Packs/day: 0.25    Years: 0.00    Pack years: 0.00    Types: Cigarettes  . Smokeless tobacco: Never Used  Vaping Use  . Vaping Use: Never used  Substance Use Topics  . Alcohol use: No  . Drug use: No    Home Medications Prior to Admission medications   Medication Sig Start Date End Date Taking? Authorizing Provider  atorvastatin (LIPITOR) 40 MG tablet Take 1 tablet (40 mg total) by mouth daily. Patient not taking: Reported on 11/20/2019 10/02/18   Jeraldine Loots,  Campbell Lerner, NP  omeprazole (PRILOSEC) 20 MG capsule Take 1 capsule (20 mg total) by mouth 2 (two) times daily before a meal. 11/20/19   Linwood Dibbles, MD    Allergies    Penicillins  Review of Systems   Review of Systems  All other systems reviewed and are negative.   Physical Exam Updated Vital Signs BP 126/88   Pulse (!) 51   Temp 98.6 F (37 C) (Oral)   Resp 20   Ht 1.778 m (5\' 10" )   Wt 117.9 kg   SpO2 100%   BMI 37.31 kg/m   Physical Exam Vitals and nursing note reviewed.  Constitutional:      General: He is not in acute distress.    Appearance: He is well-developed.  HENT:     Head: Normocephalic and atraumatic.     Right Ear: External ear normal.     Left Ear: External ear  normal.  Eyes:     General: No scleral icterus.       Right eye: No discharge.        Left eye: No discharge.     Conjunctiva/sclera: Conjunctivae normal.  Neck:     Trachea: No tracheal deviation.  Cardiovascular:     Rate and Rhythm: Normal rate and regular rhythm.  Pulmonary:     Effort: Pulmonary effort is normal. No respiratory distress.     Breath sounds: Normal breath sounds. No stridor. No wheezing or rales.  Abdominal:     General: Bowel sounds are normal. There is no distension.     Palpations: Abdomen is soft.     Tenderness: There is no abdominal tenderness. There is no guarding or rebound.  Musculoskeletal:        General: No tenderness.     Cervical back: Neck supple.  Skin:    General: Skin is warm and dry.     Findings: No rash.  Neurological:     Mental Status: He is alert.     Cranial Nerves: No cranial nerve deficit (no facial droop, extraocular movements intact, no slurred speech).     Sensory: No sensory deficit.     Motor: No abnormal muscle tone or seizure activity.     Coordination: Coordination normal.     ED Results / Procedures / Treatments   Labs (all labs ordered are listed, but only abnormal results are displayed) Labs Reviewed  BASIC METABOLIC PANEL - Abnormal; Notable for the following components:      Result Value   Calcium 8.7 (*)    All other components within normal limits  CBC  BRAIN NATRIURETIC PEPTIDE    EKG EKG Interpretation  Date/Time:  Friday November 20 2019 07:53:39 EST Ventricular Rate:  61 PR Interval:    QRS Duration: 101 QT Interval:  395 QTC Calculation: 398 R Axis:   22 Text Interpretation: sinus rhythm Nonspecific T abnormalities, lateral leads No significant change since last tracing Confirmed by [id number] [phone]) on 11/20/2019 8:11:49 AM   Radiology DG Chest 2 View  Result Date: 11/20/2019 CLINICAL DATA:  Shortness of breath and productive cough EXAM: CHEST - 2 VIEW COMPARISON:  10/14/2018 FINDINGS:  Normal heart size and mediastinal contours. No acute infiltrate or edema. No effusion or pneumothorax. No acute osseous findings. IMPRESSION: Negative chest Electronically Signed   By: 12/14/2018 M.D.   On: 11/20/2019 04:06    Procedures Procedures (including critical care time)  Medications Ordered in ED Medications - No data to display  ED  Course  I have reviewed the triage vital signs and the nursing notes.  Pertinent labs & imaging results that were available during my care of the patient were reviewed by me and considered in my medical decision making (see chart for details).  Clinical Course as of Nov 20 1003  Fri Nov 20, 2019  2633 Laboratory tests are unremarkable   [JK]  0956 Chest x-ray negative.   [JK]    Clinical Course User Index [JK] Linwood Dibbles, MD   MDM Rules/Calculators/A&P                         DDX Acute infection, pna etc unlikely with the chronicity.  CHF a concern although would expect some sx during the day.  CXR not showing pna, mass, effusion.   Will check labs, ekg  Labs EKG chest x-ray are all normal.  No signs of pneumonia.  Doubt PE.  No findings to suggest congestive heart failure.  Symptoms could be related to possible GERD.  Patient does have a history of this.  Sleep apnea is also a concern as the symptoms all seem to occur at night.  At this time there is no signs of any acute emergency medical issue.  I will have patient start on a course of antacids.  Discussed outpatient follow-up with a primary care doctor and will give him the name of a sleep center to discuss further evaluation. Final Clinical Impression(s) / ED Diagnoses Final diagnoses:  Dyspnea, unspecified type    Rx / DC Orders ED Discharge Orders         Ordered    omeprazole (PRILOSEC) 20 MG capsule  2 times daily before meals        11/20/19 1004           Linwood Dibbles, MD 11/20/19 1006

## 2019-11-20 NOTE — ED Triage Notes (Signed)
Pt sts shob when he goes to bed for several months.

## 2019-12-21 ENCOUNTER — Ambulatory Visit: Payer: 59 | Admitting: Cardiovascular Disease

## 2020-03-21 ENCOUNTER — Other Ambulatory Visit (HOSPITAL_COMMUNITY): Payer: Self-pay | Admitting: Physical Medicine and Rehabilitation

## 2020-03-21 DIAGNOSIS — M545 Low back pain, unspecified: Secondary | ICD-10-CM

## 2020-03-21 DIAGNOSIS — M542 Cervicalgia: Secondary | ICD-10-CM

## 2020-04-06 ENCOUNTER — Encounter (HOSPITAL_COMMUNITY): Payer: Self-pay

## 2020-04-06 ENCOUNTER — Ambulatory Visit (HOSPITAL_COMMUNITY): Payer: 59 | Attending: Physical Medicine and Rehabilitation

## 2020-04-06 ENCOUNTER — Ambulatory Visit (HOSPITAL_COMMUNITY): Admission: RE | Admit: 2020-04-06 | Payer: Self-pay | Source: Ambulatory Visit

## 2020-05-03 ENCOUNTER — Other Ambulatory Visit: Payer: Self-pay

## 2020-05-03 ENCOUNTER — Ambulatory Visit (HOSPITAL_COMMUNITY)
Admission: RE | Admit: 2020-05-03 | Discharge: 2020-05-03 | Disposition: A | Discharge status: Home / Self Care | Payer: Self-pay | Source: Ambulatory Visit | Attending: Physical Medicine and Rehabilitation | Admitting: Physical Medicine and Rehabilitation

## 2020-05-03 DIAGNOSIS — M545 Low back pain, unspecified: Secondary | ICD-10-CM

## 2020-05-03 DIAGNOSIS — M542 Cervicalgia: Secondary | ICD-10-CM | POA: Insufficient documentation

## 2020-05-03 IMAGING — MR MR CERVICAL SPINE W/O CM
5 series · 42 of 48 positions shown · non-contrast
Comparison: None.

CLINICAL DATA: Chronic posterior neck pain. No injury or prior
surgery.

EXAM:
MRI CERVICAL SPINE WITHOUT CONTRAST
TECHNIQUE: Multiplanar, multisequence MR imaging of the cervical spine was
performed. No intravenous contrast was administered.

[Series 5: T1 · sagittal · 3.0mm · 0.69mm/px · 6 of 15 slices shown]
[im 1/15]
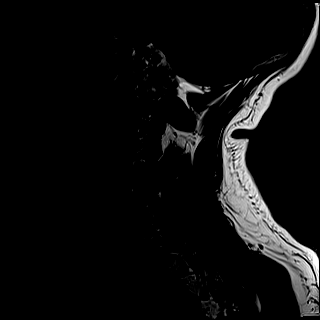
[im 3/15]
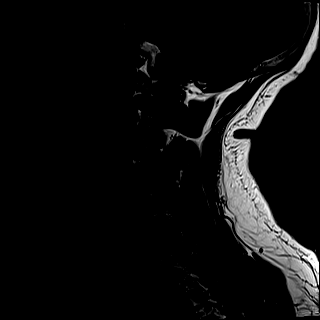
[im 6/15]
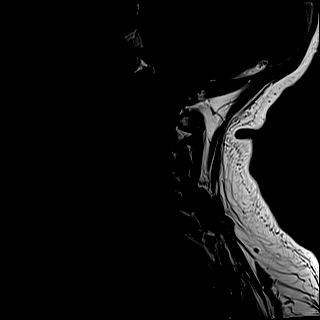
[im 9/15]
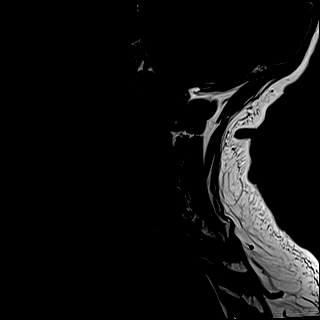
[im 12/15]
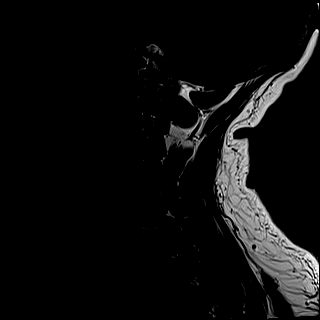
[im 15/15]
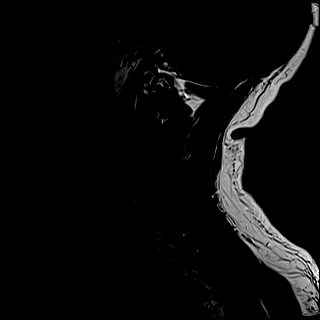

[Series 6: T2 · sagittal · 3.0mm · 0.69mm/px · 7 of 15 slices shown (1 of 2)]
[im 1/15]
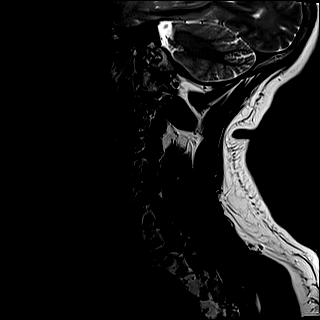
[im 3/15]
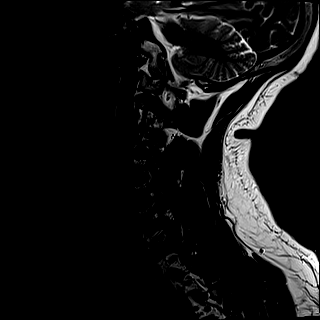
[im 5/15]
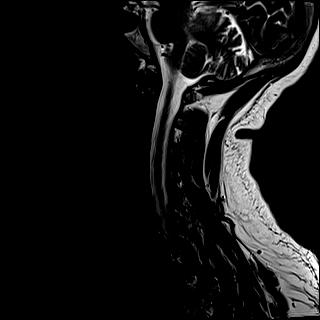
[im 8/15]
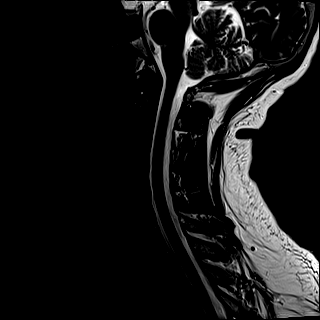
[im 10/15]
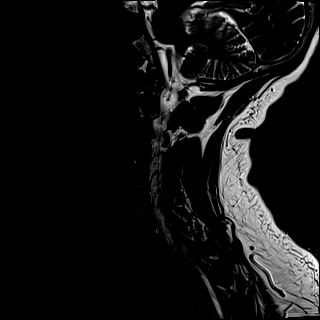
[im 12/15]
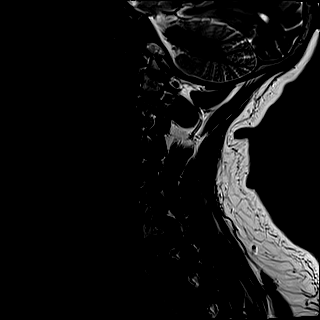
[im 15/15]
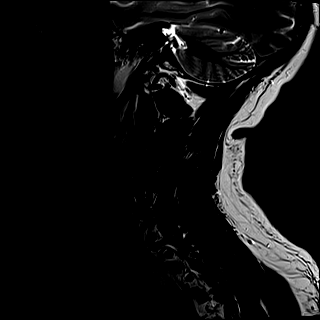

[Series 7: STIR · sagittal · 3.0mm · 0.86mm/px · 7 of 15 slices shown]
[im 1/15]
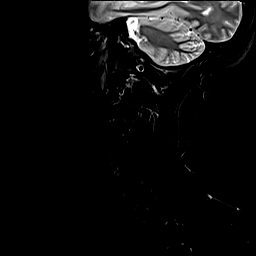
[im 3/15]
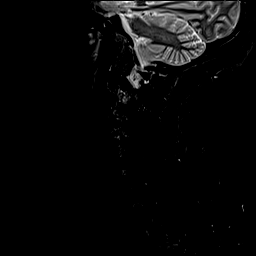
[im 5/15]
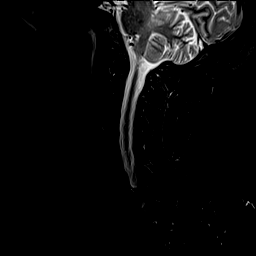
[im 8/15]
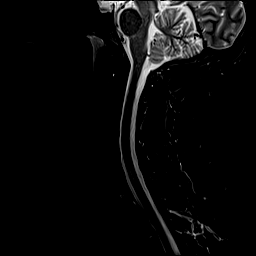
[im 10/15]
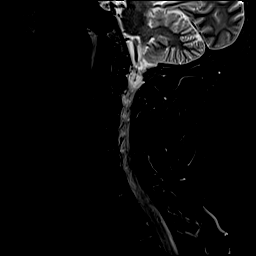
[im 12/15]
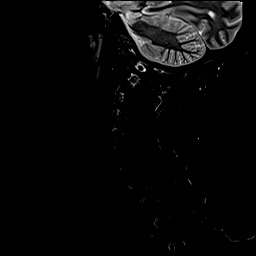
[im 15/15]
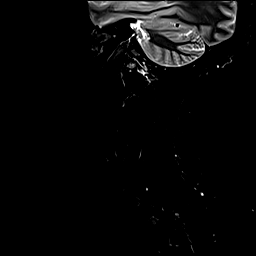

[Series 8: T2 · axial · 3.0mm · 0.70mm/px · z∈[-31,+70]mm · 14 of 30 slices shown (2 of 2)]
[im 1/30]
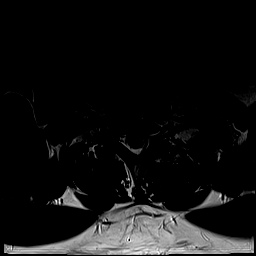
[im 3/30]
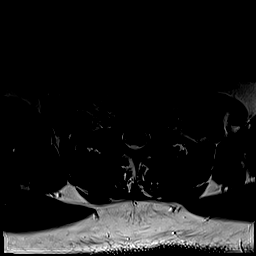
[im 5/30]
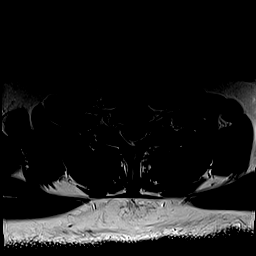
[im 7/30]
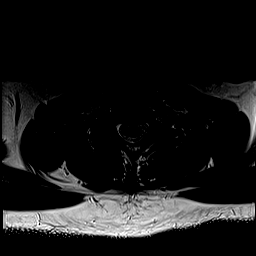
[im 9/30]
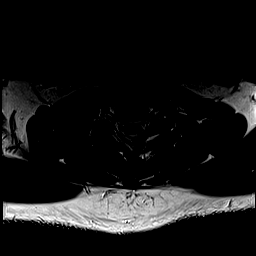
[im 12/30]
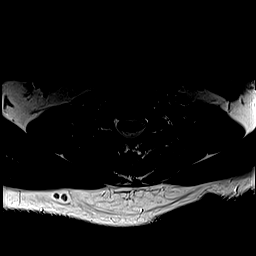
[im 14/30]
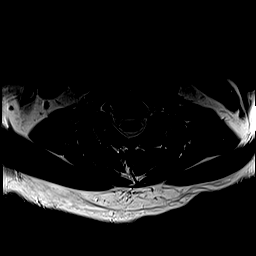
[im 16/30]
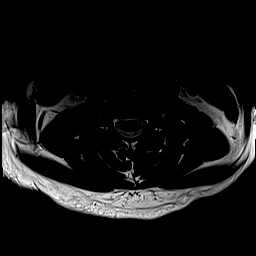
[im 18/30]
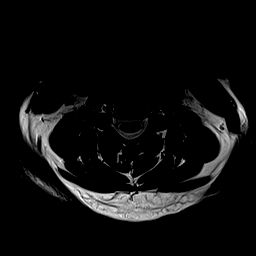
[im 21/30]
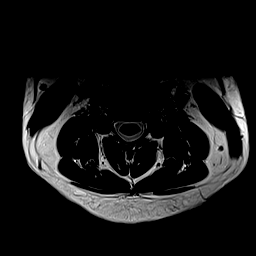
[im 23/30]
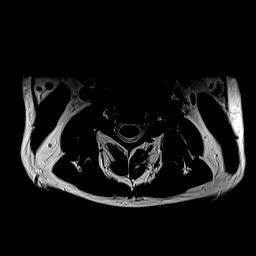
[im 25/30]
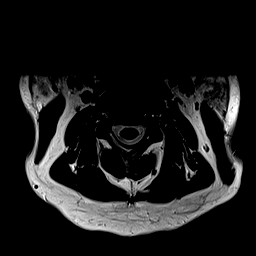
[im 27/30]
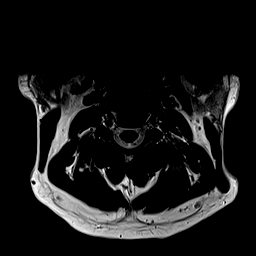
[im 30/30]
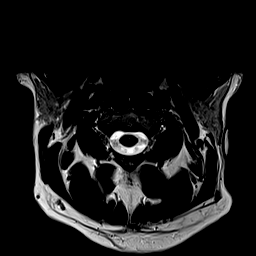

[Series 9: GRE · axial · 3.0mm · 0.35mm/px · z∈[-31,+70]mm · 8 of 30 slices shown]
[im 1/30]
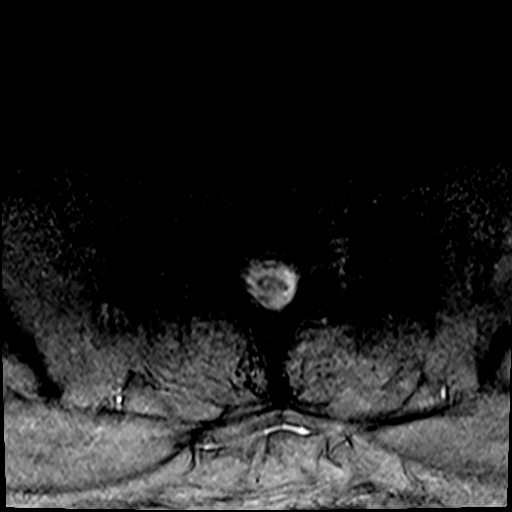
[im 5/30]
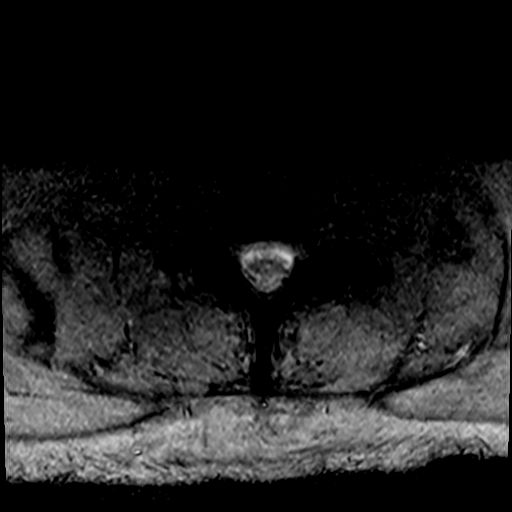
[im 9/30]
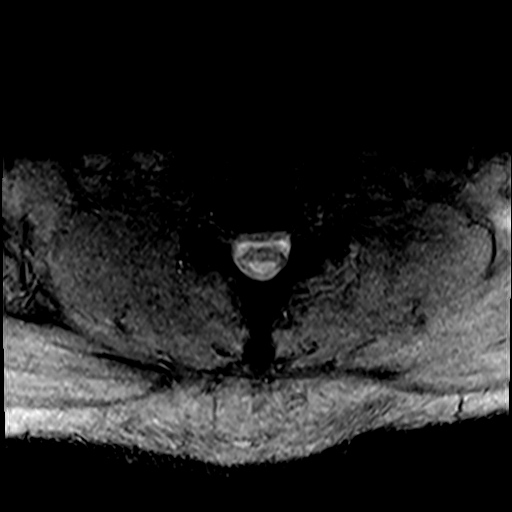
[im 14/30]
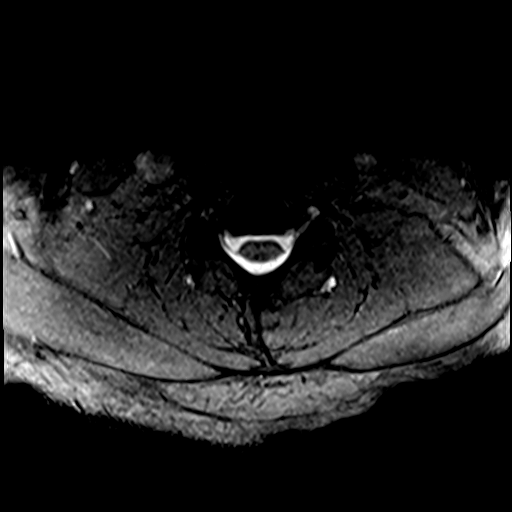
[im 16/30]
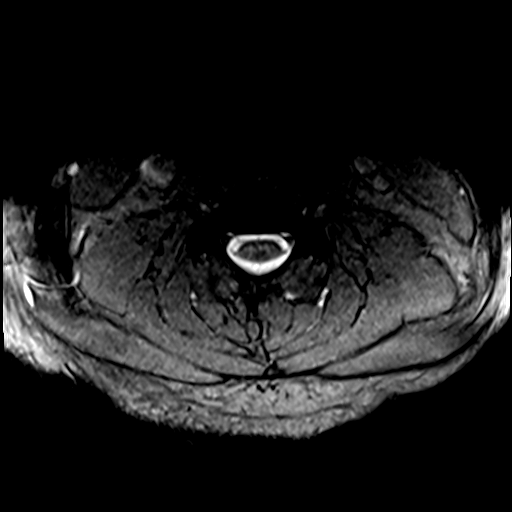
[im 21/30]
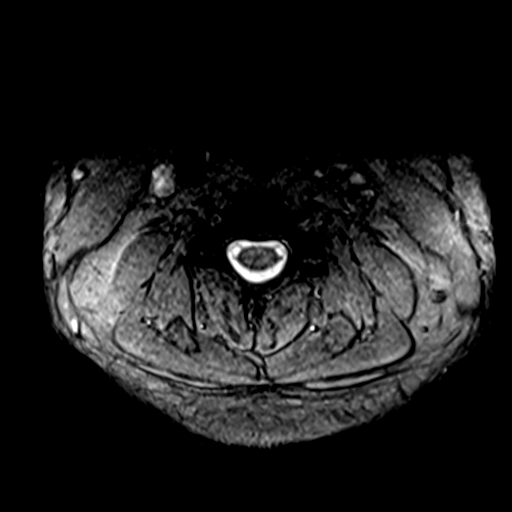
[im 25/30]
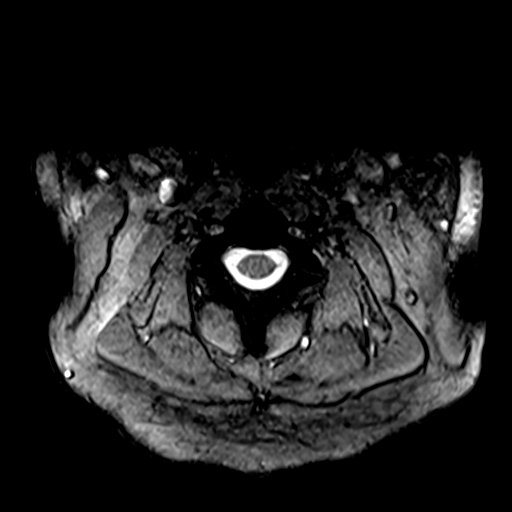
[im 30/30]
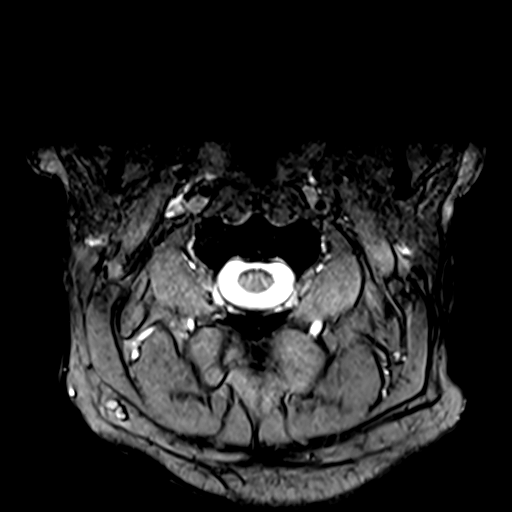

[42 of 48 positions shown; findings below may reference images not displayed]

FINDINGS: Alignment: Physiologic.

Vertebrae: No fracture, evidence of discitis, or bone lesion.

Cord: Normal signal and morphology.

Posterior Fossa, vertebral arteries, paraspinal tissues: Negative.

Disc levels:

Diffuse disc desiccation, sparing C7-T1. No significant disc bulge
or herniation. No stenosis.
IMPRESSION: 1. No significant degenerative changes.  No stenosis or impingement.

## 2020-05-03 IMAGING — MR MR LUMBAR SPINE W/O CM
5 series · 31 of 48 positions shown · non-contrast
Comparison: CT abdomen pelvis dated [DATE].

CLINICAL DATA: Chronic low back pain. No radiculopathy. No injury
or prior surgery.

EXAM:
MRI LUMBAR SPINE WITHOUT CONTRAST
TECHNIQUE: Multiplanar, multisequence MR imaging of the lumbar spine was
performed. No intravenous contrast was administered.

[Series 14: T1 · sagittal · 4.0mm · 0.81mm/px · 6 of 17 slices shown (1 of 2)]
[im 1/17]
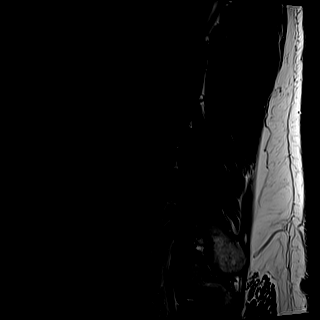
[im 4/17]
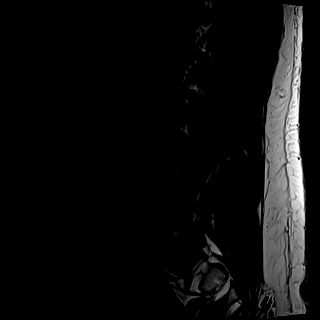
[im 7/17]
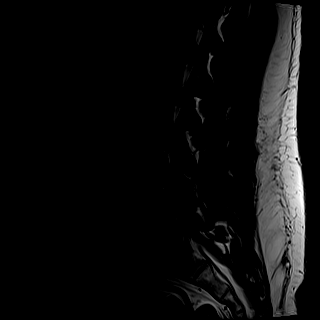
[im 10/17]
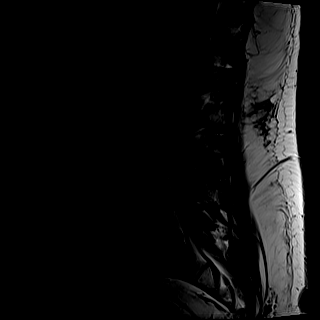
[im 13/17]
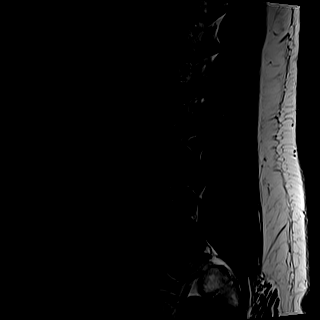
[im 17/17]
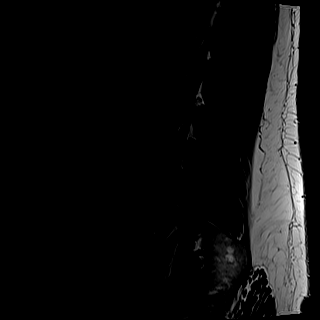

[Series 15: T2 · sagittal · 4.0mm · 0.81mm/px · 6 of 17 slices shown (1 of 2)]
[im 1/17]
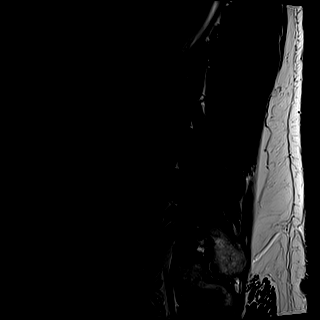
[im 4/17]
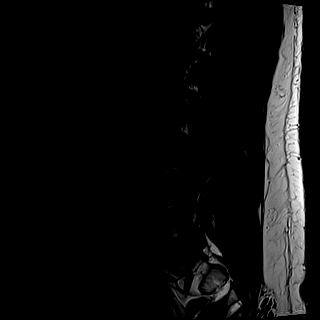
[im 7/17]
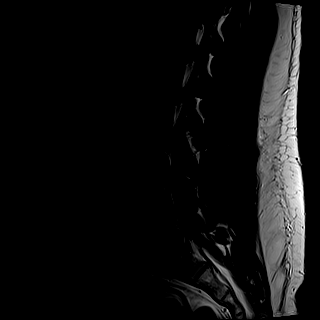
[im 10/17]
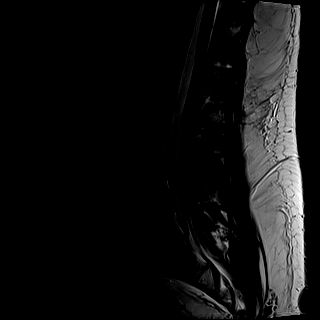
[im 13/17]
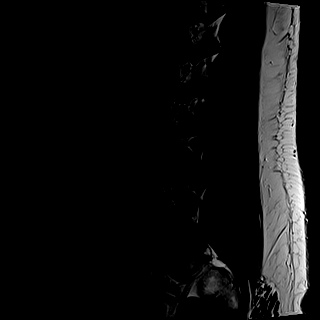
[im 17/17]
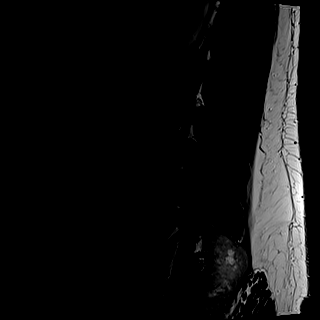

[Series 16: STIR · sagittal · 4.0mm · 0.51mm/px · 1 of 17 slices shown]
[im 1/17]
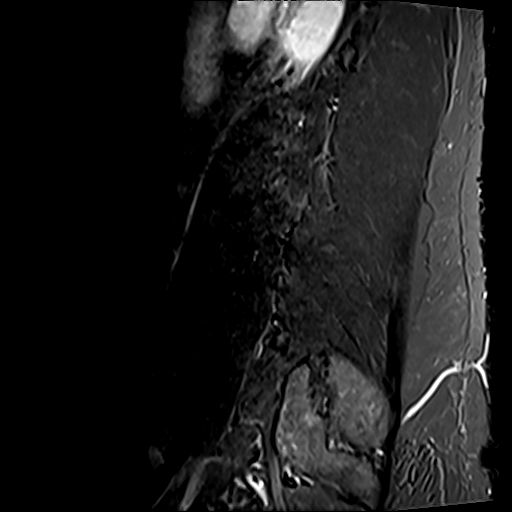

[Series 17: T2 · axial · 4.0mm · 0.62mm/px · z∈[-177,+61]mm · 9 of 43 slices shown (2 of 2)]
[im 1/43]
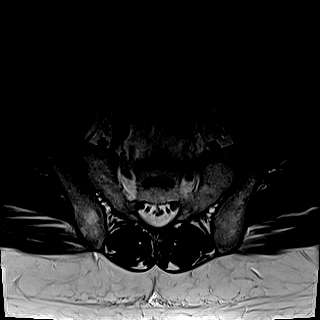
[im 7/43]
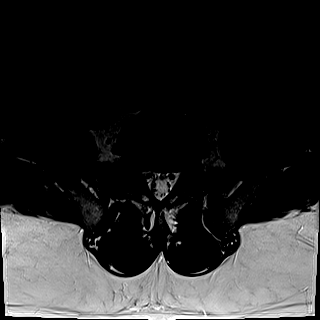
[im 13/43]
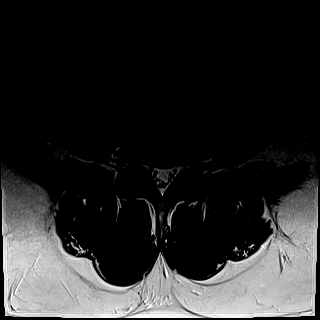
[im 19/43]
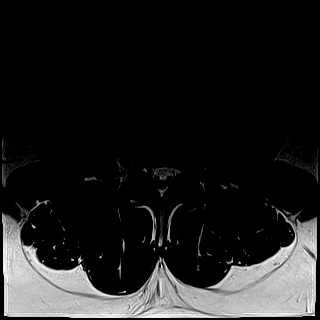
[im 22/43]
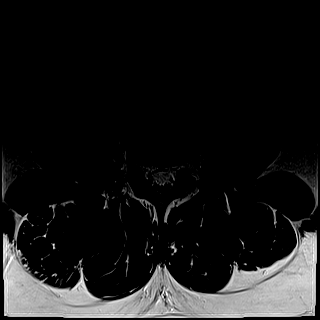
[im 25/43]
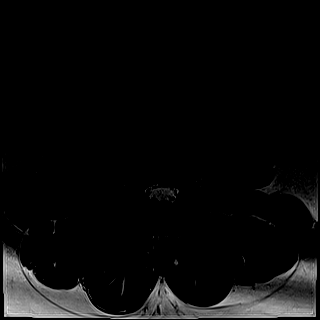
[im 31/43]
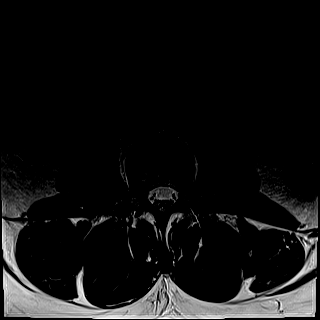
[im 37/43]
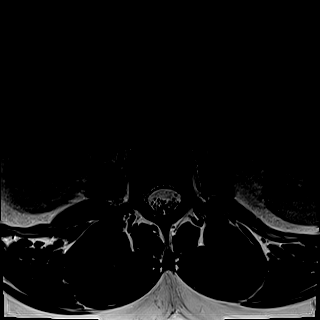
[im 43/43]
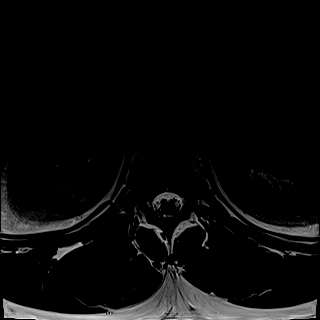

[Series 18: T1 · axial · 4.0mm · 0.39mm/px · z∈[-177,+61]mm · 9 of 43 slices shown (2 of 2)]
[im 1/43]
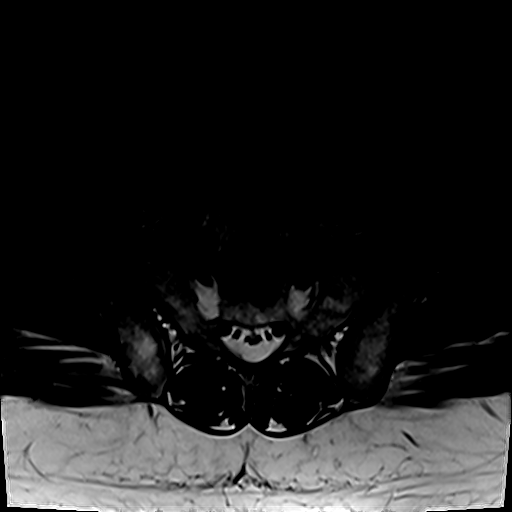
[im 7/43]
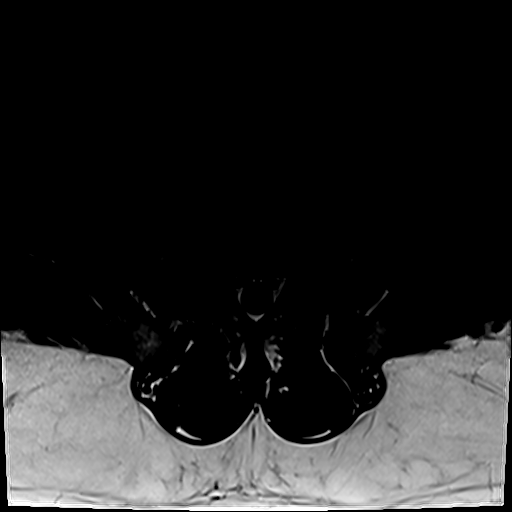
[im 13/43]
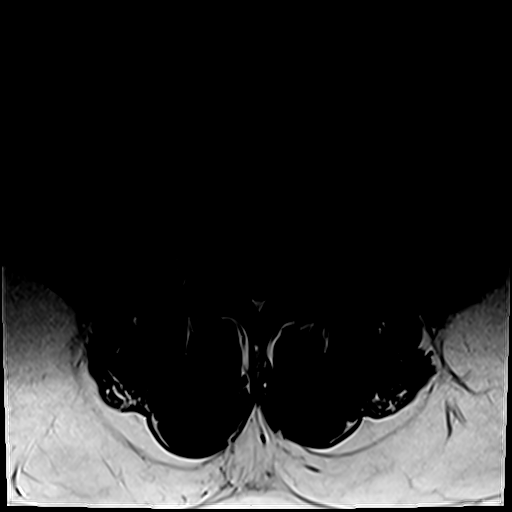
[im 19/43]
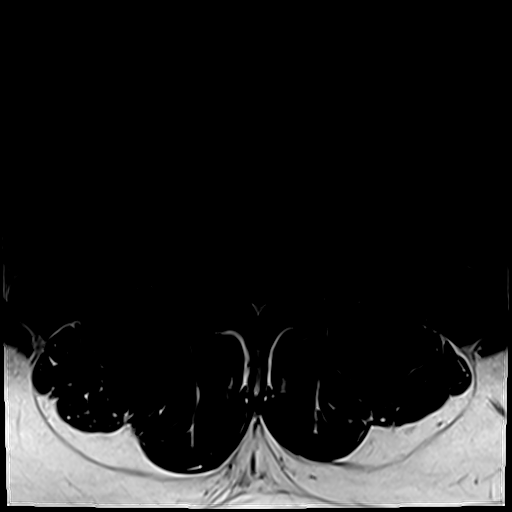
[im 22/43]
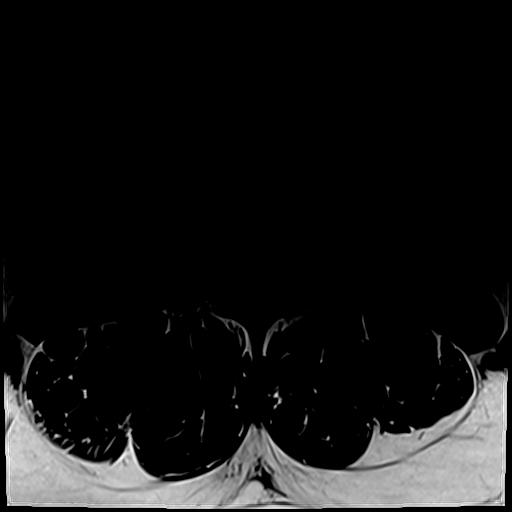
[im 25/43]
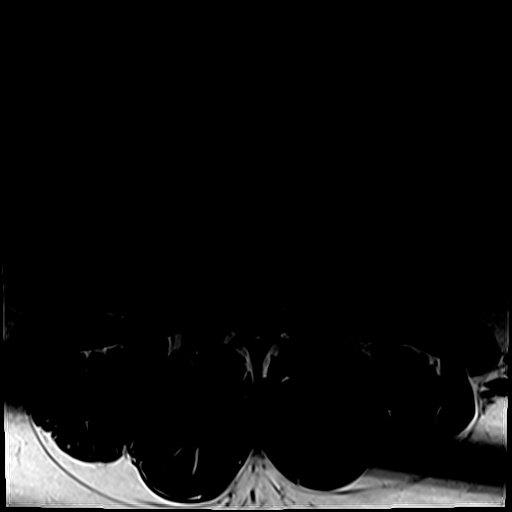
[im 31/43]
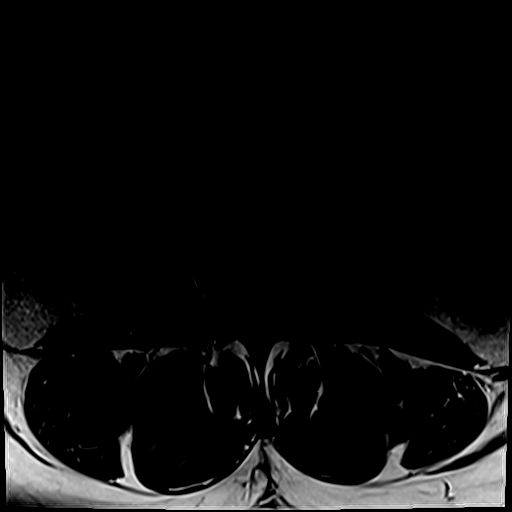
[im 37/43]
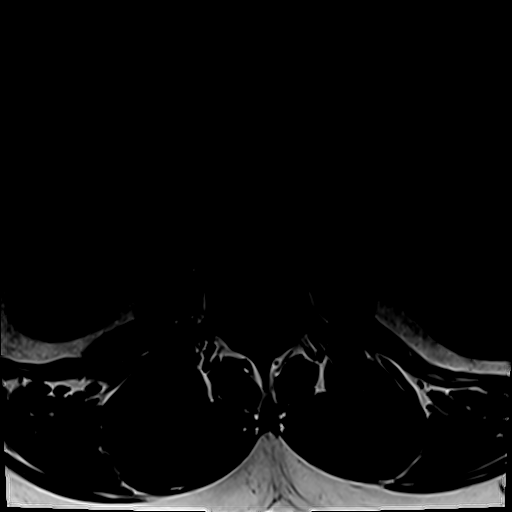
[im 43/43]
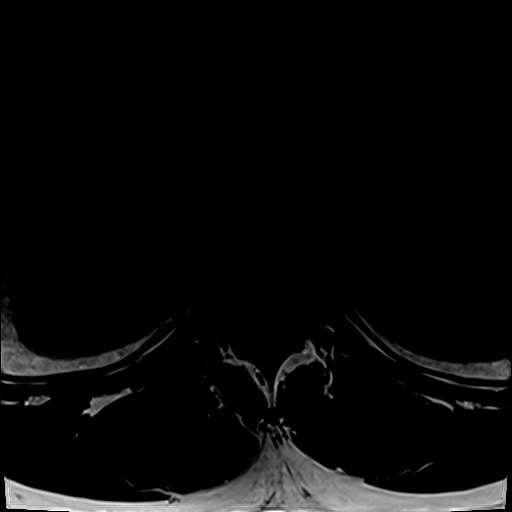

[31 of 48 positions shown; findings below may reference images not displayed]

FINDINGS: Segmentation:  Standard.

Alignment:  Physiologic.

Vertebrae:  No fracture, evidence of discitis, or bone lesion.

Conus medullaris and cauda equina: Conus extends to the L1 level.
Conus and cauda equina appear normal.

Paraspinal and other soft tissues: Negative.

Disc levels:

T12-L1:  Refer negative.

L1-L2:  Negative.

L2-L3:  Negative.

L3-L4: Mild disc bulging with superimposed small left foraminal/far
lateral disc protrusion and annular fissure contacting and
displacing the exiting left L3 nerve root. No stenosis.

L4-L5: Small shallow central disc protrusion and annular fissure.
Mild right lateral recess stenosis. No spinal canal or
neuroforaminal stenosis.

L5-S1: Small broad-based posterior disc protrusion. Mild bilateral
facet arthropathy. Mild to moderate bilateral neuroforaminal
stenosis. No spinal canal stenosis.
IMPRESSION: 1. Mild multilevel lumbar spondylosis as described above. Small left
foraminal/far lateral disc protrusion and annular fissure at L3-L4
contacting and displacing the exiting left L3 nerve root.
2. Mild to moderate bilateral neuroforaminal stenosis at L5-S1.

## 2021-08-11 ENCOUNTER — Encounter (HOSPITAL_COMMUNITY): Payer: Self-pay

## 2021-08-11 ENCOUNTER — Emergency Department (HOSPITAL_COMMUNITY)
Admission: EM | Admit: 2021-08-11 | Discharge: 2021-08-11 | Disposition: A | Discharge status: Home / Self Care | Payer: Self-pay | Attending: Student | Admitting: Student

## 2021-08-11 ENCOUNTER — Other Ambulatory Visit: Payer: Self-pay

## 2021-08-11 DIAGNOSIS — I2510 Atherosclerotic heart disease of native coronary artery without angina pectoris: Secondary | ICD-10-CM | POA: Insufficient documentation

## 2021-08-11 DIAGNOSIS — M5442 Lumbago with sciatica, left side: Secondary | ICD-10-CM | POA: Insufficient documentation

## 2021-08-11 LAB — URINALYSIS, ROUTINE W REFLEX MICROSCOPIC
Bilirubin Urine: NEGATIVE
Glucose, UA: NEGATIVE mg/dL
Hgb urine dipstick: NEGATIVE
Ketones, ur: NEGATIVE mg/dL
Leukocytes,Ua: NEGATIVE
Nitrite: NEGATIVE
Protein, ur: 30 mg/dL — AB
Specific Gravity, Urine: 1.036 — ABNORMAL HIGH (ref 1.005–1.030)
pH: 5 (ref 5.0–8.0)

## 2021-08-11 MED ORDER — KETOROLAC TROMETHAMINE 30 MG/ML IJ SOLN
30.0000 mg | Freq: Once | INTRAMUSCULAR | Status: AC
  Administered 2021-08-11: 30 mg via INTRAMUSCULAR
  Filled 2021-08-11: qty 1

## 2021-08-11 MED ORDER — OXYCODONE-ACETAMINOPHEN 5-325 MG PO TABS
1.0000 | ORAL_TABLET | Freq: Once | ORAL | Status: AC
  Administered 2021-08-11: 1 via ORAL
  Filled 2021-08-11: qty 1

## 2021-08-11 MED ORDER — METHOCARBAMOL 500 MG PO TABS: 500.0000 mg | ORAL_TABLET | Freq: Two times a day (BID) | ORAL | 0 refills | Status: AC

## 2021-08-11 MED ORDER — METHOCARBAMOL 500 MG PO TABS
500.0000 mg | ORAL_TABLET | Freq: Once | ORAL | Status: AC
  Administered 2021-08-11: 500 mg via ORAL
  Filled 2021-08-11: qty 1

## 2021-08-11 NOTE — ED Provider Notes (Signed)
[facility]-EMERGENCY DEPT Provider Note   CSN: [id number] Arrival date & time: 08/11/21  0017     History PMH: Coronary artery disease, GERD, tobacco use, history of appendectomy and diverticulitis. Chief Complaint  Patient presents with   Back Pain    Kyle Silva is a 41 y.o. male. Presents with left lower back pain that started last night around 5 PM.  He says he noticed this when he was trying to get up from a sitting to standing position.  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  He states that he has a history of lower back pain and that this feels similar.  He says that his pain radiates down both posterior thighs to about mid thigh.  He does feel like he is having difficulty walking likely due to the pain.  He denies any ataxia, decreased motor function, numbness, saddle anesthesia, bowel or bladder dysfunction, fevers, chills.  He denies any history of kidney stone.  He denies any urinary symptoms such as hematuria, flank pain, dysuria, or difficulty urinating.  He denies any abdominal pain, diarrhea, constipation, nausea, vomiting.  He denies chest pain or shortness of breath. Denies testicular pain or scrotal swelling.   Back Pain Associated symptoms: no abdominal pain, no chest pain, no dysuria, no fever, no numbness and no weakness        Home Medications Prior to Admission medications   Medication Sig Start Date End Date Taking? Authorizing Provider  methocarbamol (ROBAXIN) 500 MG tablet Take 1 tablet (500 mg total) by mouth 2 (two) times daily for 14 days. 08/11/21 08/25/21 Yes Tikisha Molinaro, Finis Bud, PA-C  atorvastatin (LIPITOR) 40 MG tablet Take 1 tablet (40 mg total) by mouth daily. Patient not taking: Reported on 11/20/2019 10/02/18   Berton Bon, NP  omeprazole (PRILOSEC) 20 MG capsule Take 1 capsule (20 mg total) by mouth 2 (two) times daily before a meal. 11/20/19   Linwood Dibbles, MD      Allergies    Penicillins    Review of Systems   Review of Systems  Constitutional:  Negative for chills and fever.  Respiratory:  Negative for chest tightness and shortness of breath.   Cardiovascular:  Negative for chest pain and leg swelling.  Gastrointestinal:  Negative for abdominal pain, nausea and vomiting.  Genitourinary:  Negative for difficulty urinating, dysuria, flank pain and hematuria.  Musculoskeletal:  Positive for back pain. Negative for gait problem.  Neurological:  Negative for dizziness, weakness and numbness.  All other systems reviewed and are negative.   Physical Exam Updated Vital Signs BP 138/88 (BP Location: Left Arm)   Pulse 89   Temp 99.6 F (37.6 C) (Oral)   Resp 19   Ht 5\' 10"  (1.778 m)   Wt 117.9 kg   SpO2 99%   BMI 37.31 kg/m  Physical Exam Vitals and nursing note reviewed.  Constitutional:      General: He is not in acute distress.    Appearance: Normal appearance. He is well-developed. He is not ill-appearing, toxic-appearing or diaphoretic.  HENT:     Head: Normocephalic and atraumatic.     Nose: No nasal deformity.     Mouth/Throat:     Lips: Pink. No lesions.  Eyes:     General: Gaze aligned appropriately. No scleral icterus.       Right eye: No discharge.  Left eye: No discharge.     Conjunctiva/sclera: Conjunctivae normal.     Right eye: Right conjunctiva is not injected. No exudate or hemorrhage.    Left eye: Left conjunctiva is not injected. No exudate or hemorrhage. Cardiovascular:     Rate and Rhythm: Normal rate and regular rhythm.     Pulses: Normal pulses.  Pulmonary:     Effort: Pulmonary effort is normal. No respiratory distress.  Abdominal:     General: Abdomen is flat. There is no distension.     Palpations: Abdomen is soft. There is no mass.     Tenderness: There is no abdominal tenderness. There is no right CVA tenderness, left CVA tenderness, guarding or rebound.     Hernia: No hernia is  present.  Musculoskeletal:     Right lower leg: No edema.     Left lower leg: No edema.     Comments: No midline tenderness of spine, no stepoff or deformity; reproducible muscular tenderness in left paraspinal muscles as well as reproducible tenderness in the left hip and + right straight leg raise. DP/PT pulses 2+ and equal bilaterally No leg edema Sensation grossly intact on anterior thighs, dorsum of foot and lateral foot Strength of knee flexion and extension is 5/5 Plantar and dorsiflexion of ankle 5/5   Skin:    General: Skin is warm and dry.  Neurological:     Mental Status: He is alert and oriented to person, place, and time.  Psychiatric:        Mood and Affect: Mood normal.        Speech: Speech normal.        Behavior: Behavior normal. Behavior is cooperative.     ED Results / Procedures / Treatments   Labs (all labs ordered are listed, but only abnormal results are displayed) Labs Reviewed  URINALYSIS, ROUTINE W REFLEX MICROSCOPIC - Abnormal; Notable for the following components:      Result Value   Specific Gravity, Urine 1.036 (*)    Protein, ur 30 (*)    Bacteria, UA RARE (*)    All other components within normal limits    EKG None  Radiology No results found.  Procedures Procedures   Medications Ordered in ED Medications  ketorolac (TORADOL) 30 MG/ML injection 30 mg (30 mg Intramuscular Given 08/11/21 0718)  methocarbamol (ROBAXIN) tablet 500 mg (500 mg Oral Given 08/11/21 0719)  oxyCODONE-acetaminophen (PERCOCET/ROXICET) 5-325 MG per tablet 1 tablet (1 tablet Oral Given 08/11/21 0900)    ED Course/ Medical Decision Making/ A&P Clinical Course as of 08/11/21 1034  Fri Aug 11, 2021  0854 Continues to have pain. Will give PO percocet. Awaiting urine.  [GL]    Clinical Course User Index [GL] Victorino Dike Finis Bud, PA-C                           Medical Decision Making Amount and/or Complexity of Data Reviewed Labs: ordered.  Risk Prescription drug  management.    MDM  This is a 41 y.o. male who presents to the ED with left lower back pain The differential of this patient includes but is not limited to musculoskeletal, sciatica, AAA rupture, Cauda Equina, Compression Fracture, Epidural Abscess, herniated Disc, Kidney Stone, Pyelonephritis, Vertebral Fracture.   Initial Impression  Patient is well-appearing with stable vitals.  He has reproducible left-sided musculoskeletal tenderness seems consistent with sciatica primarily the left side.  He has no red flag lower back pain  symptoms to suggest cauda equina, vertebral fracture, epidural abscess, malignancy, or epidural hematoma. Will get a urine to screen for UTI/Pyelo or blood. Lower suspicion for kidney stone. Low suspicion for AAA with stable vitals, no abdominal symptoms and  low risk.  Will treat pain with Toradol and Robaxin. Plan to reassess.   I personally ordered, reviewed, and interpreted all laboratory work and imaging and agree with radiologist interpretation. Results interpreted below: Urine shows no evidence of any blood or signs of urinary tract infection.  Assessment/Plan:  Within normal urine, I think that this is likely musculoskeletal.  He has gotten some pain improvement with Toradol and Robaxin. I did end up having to give him a Percocet for breakthrough pain.  I do not think that we need any further advanced imaging at this time as he has no red flag symptoms.  I have advised him that he should follow-up with orthopedics.  He has previously seen [facility], however patient does not want to go back to them.  I have provided him a referral with the on-call orthopedic provider today.  I have also recommended supportive treatments at home such as pain management, stretching, and walking.    Charting Requirements Additional history is obtained from:  Independent historian External Records from outside source obtained and reviewed including: prior orthopedic notes Social  Determinants of Health:  none Pertinant PMH that complicates patient's illness: hx of lower back pain  Patient Care Problems that were addressed during this visit: - acute left sided lower back pain with left sided sciatica: Acute illness with complication Medications given in ED: Robaxin, toradol, percocet Reevaluation of the patient after these medicines showed that the patient improved I have reviewed home medications and made changes accordingly.  Critical Care Interventions: n/a Consultations: n/a Disposition: discharge  This is a supervised visit with my attending physician, Dr. Audrie Lia. We have discussed this patient and they have altered the plan as needed.  Portions of this note were generated with Scientist, clinical (histocompatibility and immunogenetics). Dictation errors may occur despite best attempts at proofreading.    Final Clinical Impression(s) / ED Diagnoses Final diagnoses:  Acute left-sided low back pain with left-sided sciatica    Rx / DC Orders ED Discharge Orders          Ordered    methocarbamol (ROBAXIN) 500 MG tablet  2 times daily        08/11/21 1030              Dietrick Barris, Finis Bud, PA-C 08/11/21 1034    Kommor, Wyn Forster, MD 08/11/21 1315

## 2021-08-11 NOTE — Discharge Instructions (Signed)
Please alternate 650 mg Tylenol and 600 mg of Ibuprofen every 4-6 hours for up to 3-4 days.  You were given a prescription for Robaxin which is a muscle relaxer.  You should not drive, work, consume alcohol, or operate machinery while taking this medication as it can make you very drowsy.  Please take this medication as needed for pain. I usually recommend that you take this at night.   I also recommend continuing to stretch and walk as much as possible to prevent the muscles from tightening.   Please call Dr. Odis Hollingshead with Orthopedics for a follow up visit.
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
## 2022-08-26 ENCOUNTER — Emergency Department (HOSPITAL_COMMUNITY)
Admission: EM | Admit: 2022-08-26 | Discharge: 2022-08-26 | Disposition: A | Discharge status: Home / Self Care | Payer: Self-pay | Attending: Emergency Medicine | Admitting: Emergency Medicine

## 2022-08-26 ENCOUNTER — Encounter (HOSPITAL_COMMUNITY): Payer: Self-pay

## 2022-08-26 ENCOUNTER — Emergency Department (HOSPITAL_COMMUNITY): Payer: Self-pay

## 2022-08-26 ENCOUNTER — Other Ambulatory Visit: Payer: Self-pay

## 2022-08-26 DIAGNOSIS — R1013 Epigastric pain: Secondary | ICD-10-CM | POA: Insufficient documentation

## 2022-08-26 LAB — COMPREHENSIVE METABOLIC PANEL
ALT: 69 U/L — ABNORMAL HIGH (ref 0–44)
AST: 38 U/L (ref 15–41)
Albumin: 4 g/dL (ref 3.5–5.0)
Alkaline Phosphatase: 61 U/L (ref 38–126)
Anion gap: 6 (ref 5–15)
BUN: 12 mg/dL (ref 6–20)
CO2: 27 mmol/L (ref 22–32)
Calcium: 8.6 mg/dL — ABNORMAL LOW (ref 8.9–10.3)
Chloride: 104 mmol/L (ref 98–111)
Creatinine, Ser: 1.04 mg/dL (ref 0.61–1.24)
GFR, Estimated: >60 mL/min (ref >60)
Glucose, Bld: 113 mg/dL — ABNORMAL HIGH (ref 70–99)
Potassium: 3.8 mmol/L (ref 3.5–5.1)
Sodium: 137 mmol/L (ref 135–145)
Total Bilirubin: 0.5 mg/dL (ref 0.3–1.2)
Total Protein: 7.3 g/dL (ref 6.5–8.1)

## 2022-08-26 LAB — URINALYSIS, ROUTINE W REFLEX MICROSCOPIC
Bilirubin Urine: NEGATIVE
Glucose, UA: 150 mg/dL — AB
Hgb urine dipstick: NEGATIVE
Ketones, ur: NEGATIVE mg/dL
Leukocytes,Ua: NEGATIVE
Nitrite: NEGATIVE
Protein, ur: NEGATIVE mg/dL
Specific Gravity, Urine: 1.024 (ref 1.005–1.030)
pH: 6 (ref 5.0–8.0)

## 2022-08-26 LAB — CBC
HCT: 45.8 % (ref 39.0–52.0)
Hemoglobin: 15.3 g/dL (ref 13.0–17.0)
MCH: 30.5 pg (ref 26.0–34.0)
MCHC: 33.4 g/dL (ref 30.0–36.0)
MCV: 91.4 fL (ref 80.0–100.0)
Platelets: 290 10*3/uL (ref 150–400)
RBC: 5.01 MIL/uL (ref 4.22–5.81)
RDW: 13.1 % (ref 11.5–15.5)
WBC: 7.4 10*3/uL (ref 4.0–10.5)
nRBC: 0 % (ref 0.0–0.2)

## 2022-08-26 LAB — LIPASE, BLOOD: Lipase: 33 U/L (ref 11–51)

## 2022-08-26 MED ORDER — LIDOCAINE VISCOUS HCL 2 % MT SOLN
15.0000 mL | Freq: Once | OROMUCOSAL | Status: AC
  Administered 2022-08-26: 15 mL via OROMUCOSAL
  Filled 2022-08-26: qty 15

## 2022-08-26 MED ORDER — SODIUM CHLORIDE 0.9 % IV SOLN: Freq: Once | INTRAVENOUS | Status: AC

## 2022-08-26 MED ORDER — MORPHINE SULFATE (PF) 4 MG/ML IV SOLN
4.0000 mg | Freq: Once | INTRAVENOUS | Status: AC
  Administered 2022-08-26: 4 mg via INTRAVENOUS
  Filled 2022-08-26: qty 1

## 2022-08-26 MED ORDER — FAMOTIDINE IN NACL 20-0.9 MG/50ML-% IV SOLN
20.0000 mg | Freq: Once | INTRAVENOUS | Status: AC
  Administered 2022-08-26: 20 mg via INTRAVENOUS
  Filled 2022-08-26: qty 50

## 2022-08-26 MED ORDER — ALUM & MAG HYDROXIDE-SIMETH 200-200-20 MG/5ML PO SUSP
30.0000 mL | Freq: Once | ORAL | Status: AC
  Administered 2022-08-26: 30 mL via ORAL
  Filled 2022-08-26: qty 30

## 2022-08-26 MED ORDER — FAMOTIDINE 20 MG PO TABS: 20.0000 mg | ORAL_TABLET | Freq: Two times a day (BID) | ORAL | 0 refills | Status: DC

## 2022-08-26 MED ORDER — IOHEXOL 300 MG/ML  SOLN
100.0000 mL | Freq: Once | INTRAMUSCULAR | Status: AC | PRN
  Administered 2022-08-26: 100 mL via INTRAVENOUS

## 2022-08-26 MED ORDER — FAMOTIDINE 20 MG PO TABS: 20.0000 mg | ORAL_TABLET | Freq: Two times a day (BID) | ORAL | 4 refills | Status: AC

## 2022-08-26 NOTE — ED Triage Notes (Signed)
Patient has had epigastric abdominal pain for 1 week. Feels nauseous. No diarrhea. He stated it feels like diverticulitis.

## 2022-08-26 NOTE — ED Provider Notes (Signed)
[place] EMERGENCY DEPARTMENT AT [facility] Provider Note   CSN: [id number] Arrival date & time: 08/26/22  1714     History  Chief Complaint  Patient presents with   Abdominal Pain    Kyle Silva is a 42 y.o. male.  42 year old male with prior medical history as detailed below presents for evaluation. He complains of mid abdominal pain times 3-4 days. No fever, nausea, vomiting reported. Patient reports that symptoms are similar to prior episodes of diverticulitis.     The history is provided by the patient and medical records.       Home Medications Prior to Admission medications   Medication Sig Start Date End Date Taking? Authorizing Provider  atorvastatin (LIPITOR) 40 MG tablet Take 1 tablet (40 mg total) by mouth daily. Patient not taking: Reported on 11/20/2019 10/02/18   Berton Bon, NP  omeprazole (PRILOSEC) 20 MG capsule Take 1 capsule (20 mg total) by mouth 2 (two) times daily before a meal. 11/20/19   Linwood Dibbles, MD      Allergies    Penicillins    Review of Systems   Review of Systems  All other systems reviewed and are negative.   Physical Exam Updated Vital Signs BP (!) 141/87   Pulse 81   Temp 98.7 F (37.1 C) (Oral)   Resp 18   Ht 5\' 10"  (1.778 m)   Wt 122.5 kg   SpO2 100%   BMI 38.74 kg/m  Physical Exam Vitals and nursing note reviewed.  Constitutional:      General: He is not in acute distress.    Appearance: Normal appearance. He is well-developed.  HENT:     Head: Normocephalic and atraumatic.  Eyes:     Conjunctiva/sclera: Conjunctivae normal.     Pupils: Pupils are equal, round, and reactive to light.  Cardiovascular:     Rate and Rhythm: Normal rate and regular rhythm.     Heart sounds: Normal heart sounds.  Pulmonary:     Effort: Pulmonary effort is normal. No respiratory distress.     Breath sounds: Normal breath sounds.  Abdominal:     General: There is no distension.     Palpations: Abdomen is soft.      Tenderness: There is abdominal tenderness in the epigastric area and periumbilical area.  Musculoskeletal:        General: No deformity. Normal range of motion.     Cervical back: Normal range of motion and neck supple.  Skin:    General: Skin is warm and dry.  Neurological:     General: No focal deficit present.     Mental Status: He is alert and oriented to person, place, and time.     ED Results / Procedures / Treatments   Labs (all labs ordered are listed, but only abnormal results are displayed) Labs Reviewed  LIPASE, BLOOD  COMPREHENSIVE METABOLIC PANEL  CBC  URINALYSIS, ROUTINE W REFLEX MICROSCOPIC    EKG None  Radiology No results found.  Procedures Procedures    Medications Ordered in ED Medications  0.9 %  sodium chloride infusion (has no administration in time range)    ED Course/ Medical Decision Making/ A&P                                 Medical Decision Making Amount and/or Complexity of Data Reviewed Labs: ordered. Radiology: ordered.  Risk OTC drugs. Prescription  drug management.    Medical Screen Complete  This patient presented to the ED with complaint of abdominal pain.  This complaint involves an extensive number of treatment options. The initial differential diagnosis includes, but is not limited to, intra-abdominal pathology, gastritis, metabolic abnormality, etc.   This presentation is: Acute, Self-Limited, Previously Undiagnosed, Uncertain Prognosis, Complicated, Systemic Symptoms, and Threat to Life/Bodily Function  Patient with abdominal pain - epigastric - times 3-4 days. He is concerned regarding possible diverticulitis.   CT AP without acute findings.   Screening labs without acute abnormality.   Exam suggestive of gastritis.   Patient improved post treatment.   Importance of close followup stressed. Strict return precautions given and understood.  Additional history obtained: External records from outside  sources obtained and reviewed including prior ED visits and prior Inpatient records.    Problem List / ED Course:  Gastritis   Reevaluation:  After the interventions noted above, I reevaluated the patient and found that they have: improved   Disposition:  After consideration of the diagnostic results and the patients response to treatment, I feel that the patent would benefit from close outpatient followup.          Final Clinical Impression(s) / ED Diagnoses Final diagnoses:  Epigastric pain    Rx / DC Orders ED Discharge Orders     None         Wynetta Fines, MD 08/26/22 2339

## 2022-08-26 NOTE — Discharge Instructions (Signed)
Return for any problem.  ?

## 2023-04-22 ENCOUNTER — Emergency Department (HOSPITAL_COMMUNITY)
Admission: EM | Admit: 2023-04-22 | Discharge: 2023-04-23 | Discharge status: Against Medical Advice | Payer: Self-pay | Attending: Emergency Medicine | Admitting: Emergency Medicine

## 2023-04-22 ENCOUNTER — Emergency Department (HOSPITAL_COMMUNITY): Payer: Self-pay

## 2023-04-22 ENCOUNTER — Other Ambulatory Visit: Payer: Self-pay

## 2023-04-22 DIAGNOSIS — Z5321 Procedure and treatment not carried out due to patient leaving prior to being seen by health care provider: Secondary | ICD-10-CM | POA: Insufficient documentation

## 2023-04-22 DIAGNOSIS — R070 Pain in throat: Secondary | ICD-10-CM | POA: Insufficient documentation

## 2023-04-22 DIAGNOSIS — R0600 Dyspnea, unspecified: Secondary | ICD-10-CM | POA: Insufficient documentation

## 2023-04-22 DIAGNOSIS — R220 Localized swelling, mass and lump, head: Secondary | ICD-10-CM | POA: Insufficient documentation

## 2023-04-22 LAB — GROUP A STREP BY PCR: Group A Strep by PCR: NOT DETECTED

## 2023-04-22 NOTE — ED Triage Notes (Addendum)
 POV, throat pain that started this morning, 11am.  Pt noticed swelling on left lower jaw, tender to touch. Pain 8 out of 10. Pt Aox4  Pt also complaint of difficulty breathing.

## 2023-04-22 NOTE — ED Notes (Signed)
 Called for CT no answer from pt in [place]

## 2023-04-23 ENCOUNTER — Emergency Department (HOSPITAL_COMMUNITY): Payer: Self-pay

## 2023-04-23 ENCOUNTER — Other Ambulatory Visit: Payer: Self-pay

## 2023-04-23 ENCOUNTER — Encounter (HOSPITAL_COMMUNITY): Payer: Self-pay | Admitting: Emergency Medicine

## 2023-04-23 ENCOUNTER — Emergency Department (HOSPITAL_COMMUNITY)
Admission: EM | Admit: 2023-04-23 | Discharge: 2023-04-23 | Disposition: A | Discharge status: Home / Self Care | Payer: Self-pay | Attending: Emergency Medicine | Admitting: Emergency Medicine

## 2023-04-23 DIAGNOSIS — K1120 Sialoadenitis, unspecified: Secondary | ICD-10-CM | POA: Insufficient documentation

## 2023-04-23 LAB — BASIC METABOLIC PANEL WITH GFR
Anion gap: 10 (ref 5–15)
BUN: 13 mg/dL (ref 6–20)
CO2: 25 mmol/L (ref 22–32)
Calcium: 9.3 mg/dL (ref 8.9–10.3)
Chloride: 103 mmol/L (ref 98–111)
Creatinine, Ser: 1.08 mg/dL (ref 0.61–1.24)
GFR, Estimated: >60 mL/min (ref >60)
Glucose, Bld: 99 mg/dL (ref 70–99)
Potassium: 4.1 mmol/L (ref 3.5–5.1)
Sodium: 138 mmol/L (ref 135–145)

## 2023-04-23 LAB — CBC WITH DIFFERENTIAL/PLATELET
Abs Immature Granulocytes: 0.01 10*3/uL (ref 0.00–0.07)
Basophils Absolute: 0 10*3/uL (ref 0.0–0.1)
Basophils Relative: 1 %
Eosinophils Absolute: 0.1 10*3/uL (ref 0.0–0.5)
Eosinophils Relative: 2 %
HCT: 49.3 % (ref 39.0–52.0)
Hemoglobin: 16.4 g/dL (ref 13.0–17.0)
Immature Granulocytes: 0 %
Lymphocytes Relative: 33 %
Lymphs Abs: 2.2 10*3/uL (ref 0.7–4.0)
MCH: 30.6 pg (ref 26.0–34.0)
MCHC: 33.3 g/dL (ref 30.0–36.0)
MCV: 92 fL (ref 80.0–100.0)
Monocytes Absolute: 0.6 10*3/uL (ref 0.1–1.0)
Monocytes Relative: 9 %
Neutro Abs: 3.7 10*3/uL (ref 1.7–7.7)
Neutrophils Relative %: 55 %
Platelets: 285 10*3/uL (ref 150–400)
RBC: 5.36 MIL/uL (ref 4.22–5.81)
RDW: 13.2 % (ref 11.5–15.5)
WBC: 6.6 10*3/uL (ref 4.0–10.5)
nRBC: 0 % (ref 0.0–0.2)

## 2023-04-23 MED ORDER — KETOROLAC TROMETHAMINE 30 MG/ML IJ SOLN
30.0000 mg | Freq: Once | INTRAMUSCULAR | Status: AC
  Administered 2023-04-23: 30 mg via INTRAVENOUS
  Filled 2023-04-23: qty 1

## 2023-04-23 MED ORDER — CLINDAMYCIN HCL 150 MG PO CAPS
150.0000 mg | ORAL_CAPSULE | Freq: Four times a day (QID) | ORAL | 0 refills | Status: AC
Start: 2023-04-23 — End: 2023-04-30

## 2023-04-23 MED ORDER — DEXAMETHASONE SODIUM PHOSPHATE 10 MG/ML IJ SOLN
8.0000 mg | Freq: Once | INTRAMUSCULAR | Status: AC
  Administered 2023-04-23: 8 mg via INTRAVENOUS
  Filled 2023-04-23: qty 1

## 2023-04-23 MED ORDER — CLINDAMYCIN PHOSPHATE 600 MG/50ML IV SOLN
600.0000 mg | Freq: Once | INTRAVENOUS | Status: AC
  Administered 2023-04-23: 600 mg via INTRAVENOUS
  Filled 2023-04-23: qty 50

## 2023-04-23 MED ORDER — IOHEXOL 300 MG/ML  SOLN
75.0000 mL | Freq: Once | INTRAMUSCULAR | Status: AC | PRN
  Administered 2023-04-23: 75 mL via INTRAVENOUS

## 2023-04-23 NOTE — Discharge Instructions (Addendum)
 You have an infection of one of your salivary glands.  This is most often caused by a virus, however can be caused by bacteria.  We treat this with antibiotics.  You can take clindamycin 4 times per day for the next 1 week.  Your symptoms should begin to improve over the next 24 to 48 hours.  Return to the emergency room if you develop difficulty swallowing, trouble with your breathing.

## 2023-04-23 NOTE — ED Triage Notes (Signed)
 Pt states he has a sore throat with a lump in his throat x 1 day

## 2023-04-23 NOTE — ED Provider Notes (Signed)
 [place] EMERGENCY DEPARTMENT AT [facility] Provider Note   CSN: [id number] Arrival date & time: 04/23/23  9528     History  Chief Complaint  Patient presents with   Sore Throat    Kyle Silva is a 43 y.o. male.  This is a 43 year old male who is here today for a lump underneath the left side of his mouth.  Patient says that he started to notice this yesterday.  He has felt as though it is made it difficult for him to swallow, and he feels a sensation when he breathes.  He has not had any fever or chills.  Patient denies a history of diabetes.   Sore Throat       Home Medications Prior to Admission medications   Medication Sig Start Date End Date Taking? Authorizing Provider  clindamycin (CLEOCIN) 150 MG capsule Take 1 capsule (150 mg total) by mouth every 6 (six) hours for 7 days. 04/23/23 04/30/23 Yes Anders Simmonds T, DO  famotidine (PEPCID) 20 MG tablet Take 20 mg by mouth as needed for indigestion or heartburn.    [provider]  famotidine (PEPCID) 20 MG tablet Take 1 tablet (20 mg total) by mouth 2 (two) times daily. 08/26/22   Wynetta Fines, MD      Allergies    Penicillins    Review of Systems   Review of Systems  Physical Exam Updated Vital Signs BP 135/87   Pulse 80   Temp 98.5 F (36.9 C) (Oral)   Resp 18   Ht 5\' 10"  (1.778 m)   Wt 117.9 kg   SpO2 100%   BMI 37.31 kg/m  Physical Exam Vitals reviewed.  Constitutional:      Appearance: He is not toxic-appearing.  HENT:     Nose: No congestion.     Mouth/Throat:     Comments: Globally poor dentition, dental caries in the left mandibular molars.  No tongue protrusion, no pooling of secretions.  No overt floor of the mouth infection. Neck:     Comments: Omental swelling on the left. Cardiovascular:     Rate and Rhythm: Normal rate.  Neurological:     Mental Status: He is alert.     ED Results / Procedures / Treatments   Labs (all labs ordered are listed, but only  abnormal results are displayed) Labs Reviewed  BASIC METABOLIC PANEL WITH GFR  CBC WITH DIFFERENTIAL/PLATELET    EKG None  Radiology CT Maxillofacial W Contrast Result Date: 04/23/2023 CLINICAL DATA:  Provided history: Sublingual/submandibular abscess. Submental swelling. Additional history provided: The patient reports a sore throat and "lump in throat." EXAM: CT MAXILLOFACIAL WITH CONTRAST TECHNIQUE: Multidetector CT imaging of the maxillofacial structures was performed with intravenous contrast. Multiplanar CT image reconstructions were also generated. RADIATION DOSE REDUCTION: This exam was performed according to the departmental dose-optimization program which includes automated exposure control, adjustment of the mA and/or kV according to patient size and/or use of iterative reconstruction technique. CONTRAST:  75mL OMNIPAQUE IOHEXOL 300 MG/ML  SOLN COMPARISON:  None. FINDINGS: Osseous: No acute fracture or aggressive osseous lesion. Orbits: No orbital mass or acute orbital finding. Sinuses: No significant paranasal sinus disease. Soft tissues: Asymmetric prominence of the left submandibular gland. Edema/inflammatory stranding surrounding the left submandibular gland and extending more medially within the submandibular/submental region. No obstructing salivary stone is identified. Symmetric prominence of the palatine and lingual tonsils, suggesting acute tonsillitis/pharyngitis. No evidence of a tonsillar/peritonsillar abscess. No significant airway effacement. Borderline enlarged right  level IA lymph node (measuring 10 mm in short axis), likely reactive. Limited intracranial: No evidence of an acute intracranial abnormality within the field of view. IMPRESSION: 1. Symmetric prominence of the palatine and lingual tonsils, suggesting acute tonsillitis/pharyngitis. No evidence of a tonsillar/peritonsillar abscess. No significant airway effacement. 2. Asymmetric prominence of the left submandibular  gland. Associated edema/inflammatory stranding within the submandibular/submental region, centered about the left submandibular gland. Findings suggest acute left submandibular gland sialoadenitis. No obstructing salivary stone is identified. 3. Borderline enlarged right level IA lymph node, likely reactive. Electronically Signed   By: Bascom Lily D.O.   On: 04/23/2023 09:45   DG Chest 2 View Result Date: 04/22/2023 CLINICAL DATA:  Shortness of breath EXAM: CHEST - 2 VIEW COMPARISON:  11/20/2019 FINDINGS: The heart size and mediastinal contours are within normal limits. Both lungs are clear. The visualized skeletal structures are unremarkable. IMPRESSION: No active cardiopulmonary disease. Electronically Signed   By: Janeece Mechanic M.D.   On: 04/22/2023 21:20    Procedures Procedures    Medications Ordered in ED Medications  ketorolac (TORADOL) 30 MG/ML injection 30 mg (30 mg Intravenous Given 04/23/23 0813)  dexamethasone (DECADRON) injection 8 mg (8 mg Intravenous Given 04/23/23 0812)  clindamycin (CLEOCIN) IVPB 600 mg (600 mg Intravenous New Bag/Given 04/23/23 0814)  iohexol (OMNIPAQUE) 300 MG/ML solution 75 mL (75 mLs Intravenous Contrast Given 04/23/23 0913)    ED Course/ Medical Decision Making/ A&P                                 Medical Decision Making 43 year old male here today with swelling on the left side of his submental space.  Plan-patient with a fluctuant area in the submental space.  Likely infectious cause.  Potentially sublingual gland infection.  Unclear if viral or bacterial.  Will provide the patient with some steroids and antibiotics while we obtain CT imaging.  Reassessment 9:55 AM-patient's CT imaging shows sublingual sialoadenitis.  Consistent with exam.  He is feeling better after Decadron, Toradol.  Will discharge patient with prescription for clindamycin due to his penicillin allergy.  Return precaution discussed with patient at bedside.  He is agreeable with  plan.  Amount and/or Complexity of Data Reviewed Labs: ordered. Radiology: ordered.  Risk Prescription drug management.           Final Clinical Impression(s) / ED Diagnoses Final diagnoses:  Sialadenitis    Rx / DC Orders ED Discharge Orders          Ordered    clindamycin (CLEOCIN) 150 MG capsule  Every 6 hours        04/23/23 0958              Afton Horse T, DO 04/23/23 1000

## 2023-04-23 NOTE — ED Notes (Signed)
 Pt called x 3, no answer inside or outside on sidewalk

## 2023-05-06 ENCOUNTER — Ambulatory Visit
Admission: EM | Admit: 2023-05-06 | Discharge: 2023-05-06 | Disposition: A | Discharge status: Home / Self Care | Payer: Self-pay | Attending: Emergency Medicine | Admitting: Emergency Medicine

## 2023-05-06 ENCOUNTER — Encounter: Payer: Self-pay | Admitting: *Deleted

## 2023-05-06 ENCOUNTER — Other Ambulatory Visit: Payer: Self-pay

## 2023-05-06 DIAGNOSIS — U071 COVID-19: Secondary | ICD-10-CM | POA: Insufficient documentation

## 2023-05-06 LAB — POC SARS CORONAVIRUS 2 AG -  ED: SARS Coronavirus 2 Ag: POSITIVE — AB

## 2023-05-06 NOTE — ED Provider Notes (Signed)
 [facility]    CSN: [id number] Arrival date & time: 05/06/23  0930      History   Chief Complaint Chief Complaint  Patient presents with   Generalized Body Aches    HPI Kyle Silva is a 43 y.o. male.   43 year old malept, Kyle Silva, presents to urgent care for evaluation of chills,body aches, felt feverish x 3 days. Pt denies cough and URI symptoms.  Reports he has been taken TheraFlu and antibiotics for a salivary duct issue on 04/23/2023, prior to going on vacation to [place]   The history is provided by the patient. No language interpreter was used.    Past Medical History:  Diagnosis Date   Acute appendicitis 09/18/2018   Acute diverticulitis 08/30/2015   CAD (coronary artery disease)    Mild with 40% LAD stenosis by cath in 2016   Chest pain    Current smoker 07/13/2014   Diverticulitis    Epididymitis    Family history of adverse reaction to anesthesia    "dad had allergic reaction to anesthesia"    GERD (gastroesophageal reflux disease)    Obese 07/13/2014   Ruptured appendicitis 09/18/2018    Patient Active Problem List   Diagnosis Date Noted   Diverticulitis 03/31/2019   Obesity, Class I, BMI 30.0-34.9 (see actual BMI) 10/15/2018   COVID 10/15/2018   Intra-abdominal abscess (HCC) 10/14/2018   Acute appendicitis 09/18/2018   Ruptured appendicitis 09/18/2018   Acute diverticulitis 08/30/2015   Current smoker 07/13/2014   Family history of premature CAD 07/13/2014   Obese 07/13/2014   Unstable angina (HCC) 07/13/2014   Chest pain     Past Surgical History:  Procedure Laterality Date   CARDIAC CATHETERIZATION N/A 07/13/2014   Procedure: Left Heart Cath and Coronary Angiography;  Surgeon: Arleen Lacer, MD;  Location: [facility] INVASIVE [facility];  Service: Cardiovascular;  Laterality: N/A;   CYSTECTOMY Left ~ 2014   "wrist"   CYSTECTOMY Left ~ 2014   "2 on my foot""   LAPAROSCOPIC APPENDECTOMY N/A 09/18/2018   Procedure: APPENDECTOMY  LAPAROSCOPIC;  Surgeon: Jacolyn Matar, MD;  Location: [facility];  Service: General;  Laterality: N/A;       Home Medications    Prior to Admission medications   Medication Sig Start Date End Date Taking? Authorizing Provider  famotidine  (PEPCID ) 20 MG tablet Take 20 mg by mouth as needed for indigestion or heartburn.    [provider]  famotidine  (PEPCID ) 20 MG tablet Take 1 tablet (20 mg total) by mouth 2 (two) times daily. 08/26/22   Burnette Carte, MD    Family History Family History  Problem Relation Age of Onset   AAA (abdominal aortic aneurysm) Mother    Hypertension Mother    Diabetes Mother    Heart failure Father    Stroke Father     Social History Social History   Tobacco Use   Smoking status: Every Day    Current packs/day: 0.25    Types: Cigarettes   Smokeless tobacco: Never  Vaping Use   Vaping status: Never Used  Substance Use Topics   Alcohol use: No   Drug use: No     Allergies   Penicillins   Review of Systems Review of Systems  Constitutional:  Positive for chills and fever.  HENT:  Positive for congestion. Negative for facial swelling.   Respiratory:  Negative for cough.   Musculoskeletal:  Positive for myalgias.  All other systems reviewed and  are negative.    Physical Exam Triage Vital Signs ED Triage Vitals  Encounter Vitals Group     BP      Systolic BP Percentile      Diastolic BP Percentile      Pulse      Resp      Temp      Temp src      SpO2      Weight      Height      Head Circumference      Peak Flow      Pain Score      Pain Loc      Pain Education      Exclude from Growth Chart    No data found.  Updated Vital Signs BP 119/86 (BP Location: Left Arm)   Pulse 84   Temp 99.5 F (37.5 C) (Oral)   Resp 20   SpO2 96%   Visual Acuity Right Eye Distance:   Left Eye Distance:   Bilateral Distance:    Right Eye Near:   Left Eye Near:    Bilateral Near:     Physical Exam Vitals and nursing  note reviewed.  Constitutional:      General: He is not in acute distress.    Appearance: He is well-developed. He is not ill-appearing or toxic-appearing.  HENT:     Head: Normocephalic.     Right Ear: Tympanic membrane is retracted.     Left Ear: Tympanic membrane is retracted.     Nose: Mucosal edema and congestion present.     Mouth/Throat:     Mouth: Mucous membranes are moist.     Pharynx: Uvula midline.  Eyes:     General: Lids are normal.     Conjunctiva/sclera: Conjunctivae normal.     Pupils: Pupils are equal, round, and reactive to light.  Cardiovascular:     Rate and Rhythm: Normal rate and regular rhythm.     Heart sounds: Normal heart sounds.  Pulmonary:     Effort: Pulmonary effort is normal. No respiratory distress.     Breath sounds: Normal breath sounds and air entry. No decreased breath sounds or wheezing.  Abdominal:     General: There is no distension.     Palpations: Abdomen is soft.  Musculoskeletal:        General: Normal range of motion.     Cervical back: Normal range of motion.  Skin:    General: Skin is warm and dry.     Findings: No rash.  Neurological:     General: No focal deficit present.     Mental Status: He is alert and oriented to person, place, and time.     GCS: GCS eye subscore is 4. GCS verbal subscore is 5. GCS motor subscore is 6.     Cranial Nerves: No cranial nerve deficit.     Sensory: No sensory deficit.  Psychiatric:        Attention and Perception: Attention normal.        Mood and Affect: Mood normal.        Speech: Speech normal.        Behavior: Behavior normal. Behavior is cooperative.      UC Treatments / Results  Labs (all labs ordered are listed, but only abnormal results are displayed) Labs Reviewed  POC SARS CORONAVIRUS 2 AG -  ED - Abnormal; Notable for the following components:      Result Value  SARS Coronavirus 2 Ag Positive (*)    All other components within normal limits    EKG   Radiology No  results found.  Procedures Procedures (including critical care time)  Medications Ordered in UC Medications - No data to display  Initial Impression / Assessment and Plan / UC Course  I have reviewed the triage vital signs and the nursing notes.  Pertinent labs & imaging results that were available during my care of the patient were reviewed by me and considered in my medical decision making (see chart for details).    Discussed exam findings and plan of care with patient, work note given , strict go to ER precautions given.   Patient verbalized understanding to this provider.  Ddx: COVID, viral illness, allergies Final Clinical Impressions(s) / UC Diagnoses   Final diagnoses:  COVID     Discharge Instructions      The recommendations suggest returning to normal activities when, for at least 24 hours, symptoms are improving overall, and if a fever was present, it has been gone without use of a fever-reducing medication.  Once people resume normal activities, they are encouraged to take additional prevention strategies for the next 5 days to curb disease spread, such as taking more steps for cleaner air, enhancing hygiene practices, wearing a well-fitting mask, keeping a distance from others, and/or getting tested for respiratory viruses  If you develop chest pain, shortness of breath, palpitations or worsening issues go to Er for new or worsening issues     ED Prescriptions   None    PDMP not reviewed this encounter.   Peter Brands, NP 05/06/23 1256

## 2023-05-06 NOTE — ED Triage Notes (Signed)
 Pt reports chills, body aches and subjective fever x 3 days. Denies cough/ URI symptoms. Recent visit to [place] He states he has been taking thera-flu and antibiotics for a clogged salivary duct- States seen in the ED 2 days prior to going on  vacation

## 2023-05-06 NOTE — Discharge Instructions (Signed)
 The recommendations suggest returning to normal activities when, for at least 24 hours, symptoms are improving overall, and if a fever was present, it has been gone without use of a fever-reducing medication.  Once people resume normal activities, they are encouraged to take additional prevention strategies for the next 5 days to curb disease spread, such as taking more steps for cleaner air, enhancing hygiene practices, wearing a well-fitting mask, keeping a distance from others, and/or getting tested for respiratory viruses  If you develop chest pain, shortness of breath, palpitations or worsening issues go to Er for new or worsening issues

## 2023-08-07 ENCOUNTER — Ambulatory Visit: Payer: Self-pay

## 2023-08-07 NOTE — Telephone Encounter (Signed)
 FYI Only or Action Required?: FYI only for provider.  Patient was last seen in primary care on No PCP.  Called Nurse Triage reporting Abdominal Pain.  Symptoms began a week ago.  Interventions attempted: Nothing.  Symptoms are: unchanged.  Triage Disposition: See Physician Within 24 Hours  Patient/caregiver understands and will follow disposition?: advised to go to [facility] if pt wont go to ED-                Copied from CRM #[id number]. Topic: Clinical - Red Word Triage >> Aug 07, 2023  9:56 AM Kyle Silva wrote: Red Word that prompted transfer to Nurse Triage: pt has been having abdominal pain,he can't eat without throwing up. Pt has gert.   ----------------------------------------------------------------------- From previous Reason for Contact - Scheduling: Patient/patient representative is calling to schedule an appointment. Refer to attachments for appointment information. Reason for Disposition  [1] MODERATE pain (e.g., interferes with normal activities) AND [2] pain comes and goes (cramps) AND [3] present > 24 hours  (Exception: Pain with Vomiting or Diarrhea - see that Guideline.)  Answer Assessment - Initial Assessment Questions 1. LOCATION: Where does it hurt?      Stomach hurts  2. RADIATION: Does the pain shoot anywhere else? (e.g., chest, back)     *No Answer* 3. ONSET: When did the pain begin? (Minutes, hours or days ago)      Last week  5. PATTERN Does the pain come and go, or is it constant?     Comes and goes  6. SEVERITY: How bad is the pain?  (e.g., Scale 1-10; mild, moderate, or severe)     Moderate  7. RECURRENT SYMPTOM: Have you ever had this type of stomach pain before? If Yes, ask: When was the last time? and What happened that time?      Yes diverticulitis  8. CAUSE: What do you think is causing the stomach pain? (e.g., gallstones, recent abdominal surgery)     *No Answer* 9. RELIEVING/AGGRAVATING FACTORS: What makes it  better or worse? (e.g., antacids, bending or twisting motion, bowel movement)     *No Answer* 10. OTHER SYMPTOMS: Do you have any other symptoms? (e.g., back pain, diarrhea, fever, urination pain, vomiting)       Vomiting, poor solid po intake  Protocols used: Abdominal Pain - Male-A-AH
